# Patient Record
Sex: Female | Born: 1995 | Race: White | Hispanic: No | Marital: Married | Smoking: Former smoker
Health system: Southern US, Community
[De-identification: ages and names within clinical notes are randomized; demographics above are authoritative.]

## PROBLEM LIST (undated history)

## (undated) DIAGNOSIS — K219 Gastro-esophageal reflux disease without esophagitis: Secondary | ICD-10-CM

## (undated) DIAGNOSIS — F419 Anxiety disorder, unspecified: Secondary | ICD-10-CM

## (undated) DIAGNOSIS — G8929 Other chronic pain: Secondary | ICD-10-CM

## (undated) DIAGNOSIS — F32A Depression, unspecified: Secondary | ICD-10-CM

## (undated) DIAGNOSIS — G935 Compression of brain: Secondary | ICD-10-CM

## (undated) DIAGNOSIS — R519 Other chronic pain: Secondary | ICD-10-CM

## (undated) HISTORY — DX: Other chronic pain: G89.29

## (undated) HISTORY — DX: Compression of brain: G93.5

## (undated) HISTORY — DX: Headache, unspecified: R51.9

---

## 2021-09-28 NOTE — Progress Notes (Signed)
Referring:  Curlene Dolphin, MD [address] [place],  [place] [address]  PCP: Nino Glow, PA-C  Neurology was asked to evaluate Sabrina Diaz, a 25 year old female for a chief complaint of headaches.  Our recommendations of care will be communicated by shared medical record.    CC:  headaches  HPI:  Medical co-morbidities: anxiety, depression  The patient presents for evaluation of headaches which began suddenly one month ago. She woke up that day and went to use the bathroom. She was straining to use the bathroom and then developed a severe, sudden headache. Headache is described as occipital throbbing and sharp pains which radiate down the neck. It is associated with photophobia, phonophobia, and nausea. Right ear feels full as well. Pain is constant but can be exacerbated by coughing or using the bathroom. She has lost ~20 lbs since the onset of the headache due to lack of appetite and nausea.   Started Topamax earlier this month which has helped her headache, but she reports new numbness in her hands and feet.  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 She has an appointment with neurosurgery scheduled for November.  Had frequent headaches as a child. Underwent testing at that time but no cause for headaches were found.  Headache History: Onset: 1 month ago Triggers: cold or heat, coughing, using the bathroom Aura: no Location: occiput radiating down into neck Quality/Description: pounding, crushing Severity: 5-10/10 Associated Symptoms:  Photophobia: yes  Phonophobia: yes  Nausea: yes Vomiting: yes Double vision Worse with activity?: yes Duration of headaches: constant Red flags:   Positional component  Thunderclap onset  Headache days per month: 30 Headache free days per month: 0  Current Treatment: Abortive Excedrin, ibuprofen - taking  every day  Preventative Topamax 50 mg daily  Prior Therapies                                 Topamax 50 mg daily   Headache Risk Factors: Headache risk factors and/or co-morbidities (+) Neck Pain (-) Back Pain (-) History of Motor Vehicle Accident (+) Obesity  Body mass index is 36.65 kg/m. (+) History of Traumatic Brain Injury and/or Concussion  LABS: 09/19/21: CBC, BMP wnl  IMAGING:  CTA head/neck 08/26/21: negative for aneurysms, dissection, or stenosis  MRI brain 09/02/21: right cerebellar tonsil extending 8 mm in foramen magnum with mild crowding   Current Outpatient Medications on File Prior to Visit  Medication Sig Dispense Refill   Ondansetron HCl (ZOFRAN PO) Take by mouth.     topiramate (TOPAMAX) 50 MG tablet Take 50 mg by mouth 2 (two) times daily.     No current facility-administered medications on file prior to visit.     Allergies: No Known Allergies  Family History: Migraine or other headaches in the family:  mother and grandmother Aneurysms in a first degree relative:  no Brain tumors in the family:  no Other neurological illness in the family:   cousin has chiari and spina bifida  Past Medical History: Past Medical History:  Diagnosis Date   Chronic headaches     Past Surgical History History reviewed. No pertinent surgical history.  Social History: Social History   Tobacco Use   Smoking status: Never   Smokeless tobacco: Never  Substance Use Topics   Alcohol use:  Never   Drug use: Yes    Types: Marijuana    ROS: Negative for fevers, chills. Positive for headaches. All other systems reviewed and negative unless stated otherwise in HPI.   Physical Exam:   Vital Signs: BP 108/78   Pulse 69   Ht 5\' 7"  (1.702 m)   Wt 234 lb (106.1 kg)   SpO2 98%   BMI 36.65 kg/m  GENERAL: well appearing,in no acute distress,alert SKIN:  Color, texture, turgor normal. No rashes or lesions HEAD:  Normocephalic/atraumatic. CV:  RRR RESP:  Normal respiratory effort MSK: no tenderness to palpation over occiput, neck, or shoulders  NEUROLOGICAL: Mental Status: Alert, oriented to person, place and time,Follows commands Cranial Nerves: PERRL, no papilledema visualized, visual fields intact to confrontation,extraocular movements intact,facial sensation intact,no facial droop or ptosis,hearing intact to finger rub bilaterally,no dysarthria,palate elevate symmetrically,tongue protrudes midline,shoulder shrug intact and symmetric Motor: muscle strength 5/5 both upper and lower extremities,no drift, normal tone Reflexes: 2+ throughout Sensation: intact to light touch all 4 extremities Coordination: Finger-to- nose-finger intact bilaterally,Heel-to-shin intact bilaterally Gait: normal-based   IMPRESSION: 25 year old female with a history of anxiety and depression who presents for evaluation of new onset daily headache in the setting of recently discovered Chiari malformation. She has aspects of the headache which are consistent with Chiari headaches including occipital location and worsening with valsalva. She is planned to see neurosurgery in November to assess if she is a surgical candidate. In the meantime will order MRI brain with CINE to assess for CSF flow obstruction. Will also order MRI of the whole spine to assess for syrinx. In terms of headache management, Topamax is helping but causing bothersome numbness and paresthesias. Will switch to Zonisamide to see if this is better tolerated.  PLAN: -Stop Topamax. Start Zonisamide 100 mg daily for two weeks, then increase to 200 mg daily. -MRI Brain CINE -MRI C,T, L spine -NSGY appointment scheduled for November  I spent a total of 79 minutes chart reviewing and counseling the patient, her husband, and her mother. Headache education was done. Discussed treatment options including preventive and acute medications. Discussed medication side effects, adverse reactions and drug  interactions. Written educational materials and patient instructions outlining all of the above were given.  Follow-up: 3 months   December, MD 09/29/2021   8:38 AM

## 2021-09-29 ENCOUNTER — Encounter: Payer: Self-pay | Admitting: Psychiatry

## 2021-09-29 ENCOUNTER — Ambulatory Visit: Payer: 59 | Admitting: Psychiatry

## 2021-09-29 VITALS — BP 108/78 | HR 69 | Ht 67.0 in | Wt 234.0 lb

## 2021-09-29 DIAGNOSIS — G935 Compression of brain: Secondary | ICD-10-CM

## 2021-09-29 MED ORDER — ZONISAMIDE 100 MG PO CAPS: ORAL_CAPSULE | ORAL | 2 refills | Status: DC

## 2021-09-29 MED ORDER — LORAZEPAM 0.5 MG PO TABS: ORAL_TABLET | ORAL | 0 refills | Status: DC

## 2021-09-29 NOTE — Patient Instructions (Addendum)
MRI of spinal cord to look for syrinx. MRI brain with sequence to look at flow of spinal fluid (CINE sequence) Start Zonisamide 100 mg daily for two weeks, then increase to 200 mg daily.

## 2021-10-03 ENCOUNTER — Telehealth: Payer: Self-pay | Admitting: Psychiatry

## 2021-10-03 MED ORDER — LORAZEPAM 0.5 MG PO TABS: ORAL_TABLET | ORAL | 0 refills | Status: DC

## 2021-10-03 NOTE — Telephone Encounter (Signed)
I sent in a second order of Ativan for her, thanks

## 2021-10-03 NOTE — Telephone Encounter (Signed)
Brain: [phone] Cervical: [phone] Lumbar: [phone] Thoracic: [phone]  Exp. 10/03/21 to 11/17/21)  Patient cannot have all four MRI's on the same day. The brain and cervical are scheduled for 10/12/21 at [facility] and the Lumbar and Thoracic is scheduled for 10/19/21.  Patient wants to make sure she has enough medicine to help her with her being claustrophobic for both days.

## 2021-10-12 ENCOUNTER — Ambulatory Visit (INDEPENDENT_AMBULATORY_CARE_PROVIDER_SITE_OTHER): Payer: 59

## 2021-10-12 DIAGNOSIS — G935 Compression of brain: Secondary | ICD-10-CM

## 2021-10-12 MED ORDER — GADOBENATE DIMEGLUMINE 529 MG/ML IV SOLN
20.0000 mL | Freq: Once | INTRAVENOUS | Status: AC | PRN
  Administered 2021-10-12: 20 mL via INTRAVENOUS

## 2021-10-19 ENCOUNTER — Ambulatory Visit (INDEPENDENT_AMBULATORY_CARE_PROVIDER_SITE_OTHER): Payer: 59

## 2021-10-19 DIAGNOSIS — G935 Compression of brain: Secondary | ICD-10-CM | POA: Diagnosis not present

## 2021-11-08 ENCOUNTER — Encounter: Payer: Self-pay | Admitting: Psychiatry

## 2021-11-08 NOTE — Telephone Encounter (Signed)
error 

## 2021-11-09 ENCOUNTER — Telehealth: Payer: Self-pay | Admitting: Psychiatry

## 2021-11-09 NOTE — Telephone Encounter (Signed)
Robin at [facility] with Dr. Samson Frederic requesting patients MRI images for patient in office. Mailed MRI disc copies to [facility] [place], [place] ATTN: Neurosurgery. Replaced disc copies in medical records office

## 2021-12-12 ENCOUNTER — Ambulatory Visit: Payer: 59 | Admitting: Psychiatry

## 2021-12-12 ENCOUNTER — Telehealth: Payer: Self-pay | Admitting: Psychiatry

## 2021-12-12 NOTE — Telephone Encounter (Signed)
FYI- pt called to cancel appt, very sick did not want to spread any germs.

## 2022-01-06 NOTE — Progress Notes (Signed)
° °  CC:  headaches  Follow-up Visit  Last visit: 09/29/22  Brief HPI: 26 year old female with a history of Chiari malformation, anxiety, and depression who follows in clinic for headaches.  At her last visit, she was having paresthesias with Topamax and it was switched to zonisamide. MRI brain and C/T/L spine was ordered.  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 There was no evidence of syrinx on C/T/L spine.  She saw Neurosurgery in December who planned to repeat MRI in one year. She would like a second opinion.  Her headaches have improved with Zonisamide. They are no longer constant, but can still be triggered by coughing and straining. She has stopped taking ibuprofen and Excedrin. Her paresthesias have stopped since stopping Topamax.  She has been struggling with insomnia lately. Is only sleeping a few hours per night.  Current Headache Regimen: Preventative: zonisamide 200 mg daily  Prior Therapies                                  Topamax 50 mg daily Zonisamide 200 mg daily  Physical Exam:   Vital Signs: BP 124/85    Pulse 67    Ht 5\' 7"  (1.702 m)    Wt 228 lb (103.4 kg)    BMI 35.71 kg/m  GENERAL:  well appearing, in no acute distress, alert  SKIN:  Color, texture, turgor normal. No rashes or lesions HEAD:  Normocephalic/atraumatic. RESP: normal respiratory effort MSK:  No gross joint deformities.   NEUROLOGICAL: Mental Status: Alert, oriented to person, place and time, Follows commands, and Speech fluent and appropriate. Cranial Nerves: PERRL, face symmetric, no dysarthria, hearing grossly intact Motor: moves all extremities equally Gait: normal-based.  IMPRESSION: 26 year old female with a history of Chiari malformation, anxiety, and depression who presents for follow up of headaches. She has had some improvement on Zonisamide but continues to suffer from headaches and  insomnia. Will add nortriptyline and see if this provides further relief. Referral to Neurosurgery for a second opinion placed per patient's request.  PLAN: -Preventive: Start nortriptyline 10 mg QHS x1 week then increase to 20 mg QHS. Continue Zonisamide 200 mg daily -Referral to Neurosurgery for second opinion regarding Chiari malformation   Follow-up: 3 months  I spent a total of 27 minutes on the date of the service. Discussed treatment options including preventive medications. Discussed medication side effects, adverse reactions and drug interactions. Written educational materials and patient instructions outlining all of the above were given.  22, MD 01/09/22 10:22 AM

## 2022-01-09 ENCOUNTER — Ambulatory Visit: Payer: 59 | Admitting: Psychiatry

## 2022-01-09 ENCOUNTER — Encounter: Payer: Self-pay | Admitting: Psychiatry

## 2022-01-09 VITALS — BP 124/85 | HR 67 | Ht 67.0 in | Wt 228.0 lb

## 2022-01-09 DIAGNOSIS — G43009 Migraine without aura, not intractable, without status migrainosus: Secondary | ICD-10-CM | POA: Diagnosis not present

## 2022-01-09 DIAGNOSIS — G935 Compression of brain: Secondary | ICD-10-CM

## 2022-01-09 MED ORDER — ZONISAMIDE 100 MG PO CAPS: 200.0000 mg | ORAL_CAPSULE | Freq: Every day | ORAL | 3 refills | Status: DC

## 2022-01-09 MED ORDER — NORTRIPTYLINE HCL 10 MG PO CAPS: ORAL_CAPSULE | ORAL | 3 refills | Status: DC

## 2022-01-09 NOTE — Patient Instructions (Signed)
Start nortriptyline for headache prevention and sleep. Take one pill at bedtime at one week, then increase to 2 pills at bedtime Continue Zonisamide for now Referral to Neurosurgery - Dr. Maurice Small

## 2022-01-10 ENCOUNTER — Telehealth: Payer: Self-pay | Admitting: Psychiatry

## 2022-01-10 NOTE — Telephone Encounter (Signed)
Sent to [facility] ph # [phone]. 

## 2022-03-03 ENCOUNTER — Encounter: Payer: Self-pay | Admitting: Psychiatry

## 2022-03-06 ENCOUNTER — Other Ambulatory Visit: Payer: Self-pay | Admitting: Psychiatry

## 2022-03-06 MED ORDER — NORTRIPTYLINE HCL 50 MG PO CAPS: 50.0000 mg | ORAL_CAPSULE | Freq: Every day | ORAL | 3 refills | Status: DC

## 2022-03-08 ENCOUNTER — Other Ambulatory Visit: Payer: Self-pay | Admitting: Neurological Surgery

## 2022-03-24 NOTE — Pre-Procedure Instructions (Signed)
Surgical Instructions ? ? ? Your procedure is scheduled on Monday, May 1st. ? Report to [facility] Main Entrance "A" at 5:30 A.M., then check in with the Admitting office. ? Call this number if you have problems the morning of surgery: ? [phone] ? ? If you have any questions prior to your surgery date call [phone]: Open Monday-Friday 8am-4pm ? ? ? Remember: ? Do not eat after midnight the night before your surgery ? ?You may drink clear liquids until 4:30 a.m. the morning of your surgery.   ?Clear liquids allowed are: Water, Non-Citrus Juices (without pulp), Carbonated Beverages, Clear Tea, Black Coffee Only (NO MILK, CREAM OR POWDERED CREAMER of any kind), and Gatorade. ?  ? Take these medicines the morning of surgery with A SIP OF WATER  ?zonisamide (ZONEGRAN) ? ?As of today, STOP taking any Aspirin (unless otherwise instructed by your surgeon) Aleve, Naproxen, Ibuprofen, Motrin, Advil, Goody's, BC's, all herbal medications, fish oil, and all vitamins. ?         ?           ?Do NOT Smoke (Tobacco/Vaping) for 24 hours prior to your procedure. ? ?If you use a CPAP at night, you may bring your mask/headgear for your overnight stay. ?  ?Contacts, glasses, piercing's, hearing aid's, dentures or partials may not be worn into surgery, please bring cases for these belongings.  ?  ?For patients admitted to the hospital, discharge time will be determined by your treatment team. ?  ?Patients discharged the day of surgery will not be allowed to drive home, and someone needs to stay with them for 24 hours. ? ?SURGICAL WAITING ROOM VISITATION ?Patients having surgery or a procedure may have two support people in the waiting room. These visitors may be switched out with other visitors if needed. ?Children under the age of 84 must have an adult accompany them who is not the patient. ?If the patient needs to stay at the hospital during part of their recovery, the visitor guidelines for inpatient rooms apply. ? ?Please  refer to the [facility] website for the visitor guidelines for Inpatients (after your surgery is over and you are in a regular room).  ? ? ?Special instructions:   ?[facility]- Preparing For Surgery ? ?Before surgery, you can play an important role. Because skin is not sterile, your skin needs to be as free of germs as possible. You can reduce the number of germs on your skin by washing with CHG (chlorahexidine gluconate) Soap before surgery.  CHG is an antiseptic cleaner which kills germs and bonds with the skin to continue killing germs even after washing.   ? ?Oral Hygiene is also important to reduce your risk of infection.  Remember - BRUSH YOUR TEETH THE MORNING OF SURGERY WITH YOUR REGULAR TOOTHPASTE ? ?Please do not use if you have an allergy to CHG or antibacterial soaps. If your skin becomes reddened/irritated stop using the CHG.  ?Do not shave (including legs and underarms) for at least 48 hours prior to first CHG shower. It is OK to shave your face. ? ?Please follow these instructions carefully. ?  ?Shower the NIGHT BEFORE SURGERY and the MORNING OF SURGERY ? ?If you chose to wash your hair, wash your hair first as usual with your normal shampoo. ? ?After you shampoo, rinse your hair and body thoroughly to remove the shampoo. ? ?Use CHG Soap as you would any other liquid soap. You can apply CHG directly to the skin and wash  gently with a scrungie or a clean washcloth.  ? ?Apply the CHG Soap to your body ONLY FROM THE NECK DOWN.  Do not use on open wounds or open sores. Avoid contact with your eyes, ears, mouth and genitals (private parts). Wash Face and genitals (private parts)  with your normal soap.  ? ?Wash thoroughly, paying special attention to the area where your surgery will be performed. ? ?Thoroughly rinse your body with warm water from the neck down. ? ?DO NOT shower/wash with your normal soap after using and rinsing off the CHG Soap. ? ?Pat yourself dry with a CLEAN TOWEL. ? ?Wear CLEAN  PAJAMAS to bed the night before surgery ? ?Place CLEAN SHEETS on your bed the night before your surgery ? ?DO NOT SLEEP WITH PETS. ? ? ?Day of Surgery: ?Take a shower with CHG soap. ?Do not wear jewelry or makeup ?Do not wear lotions, powders, perfumes, or deodorant. ?Do not shave 48 hours prior to surgery.   ?Do not bring valuables to the hospital.  ?[facility] is not responsible for any belongings or valuables. ?Do not wear nail polish, gel polish, artificial nails, or any other type of covering on natural nails (fingers and toes) ?If you have artificial nails or gel coating that need to be removed by a nail salon, please have this removed prior to surgery. Artificial nails or gel coating may interfere with anesthesia's ability to adequately monitor your vital signs. ?Wear Clean/Comfortable clothing the morning of surgery ?Remember to brush your teeth WITH YOUR REGULAR TOOTHPASTE. ?  ?Please read over the following fact sheets that you were given. ? ? ? ?If you received a COVID test during your pre-op visit  it is requested that you wear a mask when out in public, stay away from anyone that may not be feeling well and notify your surgeon if you develop symptoms. If you have been in contact with anyone that has tested positive in the last 10 days please notify you surgeon. ? ?

## 2022-03-27 ENCOUNTER — Encounter (HOSPITAL_COMMUNITY)
Admission: RE | Admit: 2022-03-27 | Discharge: 2022-03-27 | Disposition: A | Discharge status: Home / Self Care | Payer: 59 | Source: Ambulatory Visit | Attending: Neurological Surgery | Admitting: Neurological Surgery

## 2022-03-27 ENCOUNTER — Other Ambulatory Visit: Payer: Self-pay

## 2022-03-27 ENCOUNTER — Encounter (HOSPITAL_COMMUNITY): Payer: Self-pay

## 2022-03-27 VITALS — BP 123/84 | HR 88 | Temp 97.9°F | Resp 18 | Ht 66.0 in | Wt 214.3 lb

## 2022-03-27 DIAGNOSIS — Z01812 Encounter for preprocedural laboratory examination: Secondary | ICD-10-CM | POA: Diagnosis not present

## 2022-03-27 DIAGNOSIS — Z01818 Encounter for other preprocedural examination: Secondary | ICD-10-CM

## 2022-03-27 LAB — TYPE AND SCREEN
ABO/RH(D): O POS
Antibody Screen: NEGATIVE

## 2022-03-27 LAB — BASIC METABOLIC PANEL
Anion gap: 5 (ref 5–15)
BUN: 6 mg/dL (ref 6–20)
CO2: 23 mmol/L (ref 22–32)
Calcium: 8.7 mg/dL — ABNORMAL LOW (ref 8.9–10.3)
Chloride: 112 mmol/L — ABNORMAL HIGH (ref 98–111)
Creatinine, Ser: 0.92 mg/dL (ref 0.44–1.00)
GFR, Estimated: >60 mL/min (ref >60)
Glucose, Bld: 104 mg/dL — ABNORMAL HIGH (ref 70–99)
Potassium: 3.5 mmol/L (ref 3.5–5.1)
Sodium: 140 mmol/L (ref 135–145)

## 2022-03-27 LAB — CBC
HCT: 40.9 % (ref 36.0–46.0)
Hemoglobin: 13.9 g/dL (ref 12.0–15.0)
MCH: 29.4 pg (ref 26.0–34.0)
MCHC: 34 g/dL (ref 30.0–36.0)
MCV: 86.7 fL (ref 80.0–100.0)
Platelets: 251 10*3/uL (ref 150–400)
RBC: 4.72 MIL/uL (ref 3.87–5.11)
RDW: 13.1 % (ref 11.5–15.5)
WBC: 8.3 10*3/uL (ref 4.0–10.5)
nRBC: 0 % (ref 0.0–0.2)

## 2022-03-27 NOTE — Progress Notes (Signed)
PCP - Dr. Gaynelle Arabian ?Neuro-Dr. Anderson Malta Chima ?Cardiologist - denies ? ?PPM/ICD - n/a ? ?Chest x-ray - n/a ?EKG - n/a ?Stress Test - denies ?ECHO - denies ?Cardiac Cath - denies ? ?Sleep Study - denies ?CPAP - denies ? ?Blood Thinner Instructions: n/a ?Aspirin Instructions: n/a ? ?ERAS Protcol -Clear liquids until 0430 DOS ?PRE-SURGERY Ensure or G2- none ordered ? ?COVID TEST- n/a ? ?Anesthesia review: No ? ?Patient denies shortness of breath, fever, cough and chest pain at PAT appointment ? ? ?All instructions explained to the patient, with a verbal understanding of the material. Patient agrees to go over the instructions while at home for a better understanding. Patient also instructed to self quarantine after being tested for COVID-19. The opportunity to ask questions was provided. ? ? ?

## 2022-04-03 ENCOUNTER — Other Ambulatory Visit: Payer: Self-pay

## 2022-04-03 ENCOUNTER — Encounter (HOSPITAL_COMMUNITY)
Admission: RE | Disposition: A | Discharge status: Home / Self Care | Payer: Self-pay | Source: Home / Self Care | Attending: Neurological Surgery

## 2022-04-03 ENCOUNTER — Inpatient Hospital Stay (HOSPITAL_COMMUNITY): Payer: 59 | Admitting: Certified Registered Nurse Anesthetist

## 2022-04-03 ENCOUNTER — Inpatient Hospital Stay (HOSPITAL_COMMUNITY)
Admission: RE | Admit: 2022-04-03 | Discharge: 2022-04-06 | DRG: 027 | Disposition: A | Discharge status: Home / Self Care | Payer: 59 | Attending: Neurological Surgery | Admitting: Neurological Surgery

## 2022-04-03 ENCOUNTER — Encounter (HOSPITAL_COMMUNITY): Payer: Self-pay | Admitting: Neurological Surgery

## 2022-04-03 DIAGNOSIS — G935 Compression of brain: Secondary | ICD-10-CM

## 2022-04-03 DIAGNOSIS — R519 Headache, unspecified: Secondary | ICD-10-CM | POA: Diagnosis present

## 2022-04-03 DIAGNOSIS — Z87891 Personal history of nicotine dependence: Secondary | ICD-10-CM

## 2022-04-03 HISTORY — PX: SUBOCCIPITAL CRANIECTOMY CERVICAL LAMINECTOMY: SHX5404

## 2022-04-03 LAB — CREATININE, SERUM
Creatinine, Ser: 0.96 mg/dL (ref 0.44–1.00)
GFR, Estimated: >60 mL/min (ref >60)

## 2022-04-03 LAB — CBC
HCT: 38.7 % (ref 36.0–46.0)
Hemoglobin: 13.4 g/dL (ref 12.0–15.0)
MCH: 30.2 pg (ref 26.0–34.0)
MCHC: 34.6 g/dL (ref 30.0–36.0)
MCV: 87.4 fL (ref 80.0–100.0)
Platelets: 228 10*3/uL (ref 150–400)
RBC: 4.43 MIL/uL (ref 3.87–5.11)
RDW: 13.1 % (ref 11.5–15.5)
WBC: 15.5 10*3/uL — ABNORMAL HIGH (ref 4.0–10.5)
nRBC: 0 % (ref 0.0–0.2)

## 2022-04-03 LAB — ABO/RH: ABO/RH(D): O POS

## 2022-04-03 LAB — GLUCOSE, CAPILLARY: Glucose-Capillary: 142 mg/dL — ABNORMAL HIGH (ref 70–99)

## 2022-04-03 LAB — MRSA NEXT GEN BY PCR, NASAL: MRSA by PCR Next Gen: NOT DETECTED

## 2022-04-03 LAB — POCT PREGNANCY, URINE: Preg Test, Ur: NEGATIVE

## 2022-04-03 SURGERY — SUBOCCIPITAL CRANIECTOMY CERVICAL LAMINECTOMY/DURAPLASTY
Anesthesia: General

## 2022-04-03 MED ORDER — CHLORHEXIDINE GLUCONATE CLOTH 2 % EX PADS: 6.0000 | MEDICATED_PAD | Freq: Once | CUTANEOUS | Status: DC

## 2022-04-03 MED ORDER — PROPOFOL 10 MG/ML IV BOLUS
INTRAVENOUS | Status: DC | PRN
  Administered 2022-04-03: 200 mg via INTRAVENOUS
  Administered 2022-04-03: 50 mg via INTRAVENOUS

## 2022-04-03 MED ORDER — OXYCODONE HCL 5 MG/5ML PO SOLN: 5.0000 mg | Freq: Once | ORAL | Status: DC | PRN

## 2022-04-03 MED ORDER — FENTANYL CITRATE (PF) 100 MCG/2ML IJ SOLN
INTRAMUSCULAR | Status: AC
  Filled 2022-04-03: qty 2

## 2022-04-03 MED ORDER — OXYCODONE HCL 5 MG PO TABS
10.0000 mg | ORAL_TABLET | ORAL | Status: DC | PRN
  Administered 2022-04-03 – 2022-04-06 (×14): 10 mg via ORAL
  Filled 2022-04-03 (×14): qty 2

## 2022-04-03 MED ORDER — OXYCODONE HCL 5 MG PO TABS: 5.0000 mg | ORAL_TABLET | Freq: Once | ORAL | Status: DC | PRN

## 2022-04-03 MED ORDER — AMISULPRIDE (ANTIEMETIC) 5 MG/2ML IV SOLN
INTRAVENOUS | Status: AC
  Filled 2022-04-03: qty 4

## 2022-04-03 MED ORDER — ROCURONIUM BROMIDE 10 MG/ML (PF) SYRINGE
PREFILLED_SYRINGE | INTRAVENOUS | Status: DC | PRN
  Administered 2022-04-03: 20 mg via INTRAVENOUS
  Administered 2022-04-03: 60 mg via INTRAVENOUS

## 2022-04-03 MED ORDER — LIDOCAINE-EPINEPHRINE 1 %-1:100000 IJ SOLN
INTRAMUSCULAR | Status: DC | PRN
  Administered 2022-04-03: 10 mL

## 2022-04-03 MED ORDER — FENTANYL CITRATE (PF) 250 MCG/5ML IJ SOLN
INTRAMUSCULAR | Status: AC
  Filled 2022-04-03: qty 5

## 2022-04-03 MED ORDER — FENTANYL CITRATE (PF) 100 MCG/2ML IJ SOLN
INTRAMUSCULAR | Status: DC | PRN
  Administered 2022-04-03 (×5): 50 ug via INTRAVENOUS

## 2022-04-03 MED ORDER — CEFAZOLIN SODIUM-DEXTROSE 2-4 GM/100ML-% IV SOLN
2.0000 g | Freq: Three times a day (TID) | INTRAVENOUS | Status: AC
  Administered 2022-04-03 (×2): 2 g via INTRAVENOUS
  Filled 2022-04-03 (×2): qty 100

## 2022-04-03 MED ORDER — LIDOCAINE 2% (20 MG/ML) 5 ML SYRINGE
INTRAMUSCULAR | Status: DC | PRN
  Administered 2022-04-03: 100 mg via INTRAVENOUS

## 2022-04-03 MED ORDER — FENTANYL CITRATE (PF) 100 MCG/2ML IJ SOLN
25.0000 ug | INTRAMUSCULAR | Status: DC | PRN
  Administered 2022-04-03: 50 ug via INTRAVENOUS
  Administered 2022-04-03 (×2): 25 ug via INTRAVENOUS
  Administered 2022-04-03 (×2): 50 ug via INTRAVENOUS

## 2022-04-03 MED ORDER — DOCUSATE SODIUM 100 MG PO CAPS
100.0000 mg | ORAL_CAPSULE | Freq: Two times a day (BID) | ORAL | Status: DC
  Administered 2022-04-03 – 2022-04-06 (×6): 100 mg via ORAL
  Filled 2022-04-03 (×6): qty 1

## 2022-04-03 MED ORDER — ONDANSETRON HCL 4 MG/2ML IJ SOLN
INTRAMUSCULAR | Status: DC | PRN
  Administered 2022-04-03: 4 mg via INTRAVENOUS

## 2022-04-03 MED ORDER — HEMOSTATIC AGENTS (NO CHARGE) OPTIME
TOPICAL | Status: DC | PRN
  Administered 2022-04-03: 1 via TOPICAL

## 2022-04-03 MED ORDER — CEFAZOLIN SODIUM-DEXTROSE 2-4 GM/100ML-% IV SOLN
INTRAVENOUS | Status: AC
Start: 2022-04-03 — End: 2022-04-03
  Filled 2022-04-03: qty 100

## 2022-04-03 MED ORDER — THROMBIN 5000 UNITS EX SOLR
OROMUCOSAL | Status: DC | PRN
  Administered 2022-04-03: 5 mL via TOPICAL

## 2022-04-03 MED ORDER — BACITRACIN ZINC 500 UNIT/GM EX OINT
TOPICAL_OINTMENT | CUTANEOUS | Status: DC | PRN
  Administered 2022-04-03: 1 via TOPICAL

## 2022-04-03 MED ORDER — PHENYLEPHRINE 80 MCG/ML (10ML) SYRINGE FOR IV PUSH (FOR BLOOD PRESSURE SUPPORT)
PREFILLED_SYRINGE | INTRAVENOUS | Status: DC | PRN
  Administered 2022-04-03: 80 ug via INTRAVENOUS

## 2022-04-03 MED ORDER — LABETALOL HCL 5 MG/ML IV SOLN: 10.0000 mg | INTRAVENOUS | Status: DC | PRN

## 2022-04-03 MED ORDER — ACETAMINOPHEN 500 MG PO TABS: 1000.0000 mg | ORAL_TABLET | Freq: Once | ORAL | Status: DC | PRN

## 2022-04-03 MED ORDER — ZONISAMIDE 100 MG PO CAPS
100.0000 mg | ORAL_CAPSULE | Freq: Two times a day (BID) | ORAL | Status: DC
  Administered 2022-04-03 – 2022-04-06 (×6): 100 mg via ORAL
  Filled 2022-04-03 (×7): qty 1

## 2022-04-03 MED ORDER — CHLORHEXIDINE GLUCONATE CLOTH 2 % EX PADS
6.0000 | MEDICATED_PAD | Freq: Every day | CUTANEOUS | Status: DC
  Administered 2022-04-03 – 2022-04-05 (×3): 6 via TOPICAL

## 2022-04-03 MED ORDER — ACETAMINOPHEN 650 MG RE SUPP: 650.0000 mg | RECTAL | Status: DC | PRN

## 2022-04-03 MED ORDER — AMISULPRIDE (ANTIEMETIC) 5 MG/2ML IV SOLN
10.0000 mg | Freq: Once | INTRAVENOUS | Status: AC
  Administered 2022-04-03: 10 mg via INTRAVENOUS

## 2022-04-03 MED ORDER — OXYCODONE HCL 5 MG PO TABS: 5.0000 mg | ORAL_TABLET | ORAL | Status: DC | PRN

## 2022-04-03 MED ORDER — SODIUM CHLORIDE 0.9 % IV SOLN: INTRAVENOUS | Status: DC

## 2022-04-03 MED ORDER — MIDAZOLAM HCL 2 MG/2ML IJ SOLN
INTRAMUSCULAR | Status: DC | PRN
  Administered 2022-04-03: 2 mg via INTRAVENOUS

## 2022-04-03 MED ORDER — CEFAZOLIN SODIUM-DEXTROSE 2-4 GM/100ML-% IV SOLN
2.0000 g | INTRAVENOUS | Status: AC
  Administered 2022-04-03: 2 g via INTRAVENOUS

## 2022-04-03 MED ORDER — ONDANSETRON HCL 4 MG/2ML IJ SOLN
INTRAMUSCULAR | Status: AC
  Filled 2022-04-03: qty 2

## 2022-04-03 MED ORDER — POLYETHYLENE GLYCOL 3350 17 G PO PACK
17.0000 g | PACK | Freq: Every day | ORAL | Status: DC | PRN
  Administered 2022-04-04: 17 g via ORAL
  Filled 2022-04-03: qty 1

## 2022-04-03 MED ORDER — NORTRIPTYLINE HCL 25 MG PO CAPS
50.0000 mg | ORAL_CAPSULE | Freq: Every day | ORAL | Status: DC
  Administered 2022-04-03 – 2022-04-05 (×3): 50 mg via ORAL
  Filled 2022-04-03 (×4): qty 2

## 2022-04-03 MED ORDER — ORAL CARE MOUTH RINSE: 15.0000 mL | Freq: Once | OROMUCOSAL | Status: AC

## 2022-04-03 MED ORDER — DEXAMETHASONE SODIUM PHOSPHATE 10 MG/ML IJ SOLN
INTRAMUSCULAR | Status: DC | PRN
  Administered 2022-04-03: 10 mg via INTRAVENOUS

## 2022-04-03 MED ORDER — ACETAMINOPHEN 160 MG/5ML PO SOLN: 1000.0000 mg | Freq: Once | ORAL | Status: DC | PRN

## 2022-04-03 MED ORDER — LIDOCAINE 2% (20 MG/ML) 5 ML SYRINGE
INTRAMUSCULAR | Status: AC
  Filled 2022-04-03: qty 5

## 2022-04-03 MED ORDER — DEXAMETHASONE SODIUM PHOSPHATE 10 MG/ML IJ SOLN
INTRAMUSCULAR | Status: AC
  Filled 2022-04-03: qty 1

## 2022-04-03 MED ORDER — HYDROMORPHONE HCL 1 MG/ML IJ SOLN
0.5000 mg | INTRAMUSCULAR | Status: DC | PRN
  Administered 2022-04-04 – 2022-04-05 (×5): 0.5 mg via INTRAVENOUS
  Filled 2022-04-03 (×2): qty 1
  Filled 2022-04-03: qty 0.5
  Filled 2022-04-03 (×2): qty 1
  Filled 2022-04-03: qty 0.5

## 2022-04-03 MED ORDER — PROMETHAZINE HCL 25 MG PO TABS
12.5000 mg | ORAL_TABLET | ORAL | Status: DC | PRN
  Administered 2022-04-04 (×2): 12.5 mg via ORAL
  Administered 2022-04-04: 25 mg via ORAL
  Filled 2022-04-03 (×3): qty 1

## 2022-04-03 MED ORDER — MIDAZOLAM HCL 2 MG/2ML IJ SOLN
INTRAMUSCULAR | Status: AC
  Filled 2022-04-03: qty 2

## 2022-04-03 MED ORDER — HEPARIN SODIUM (PORCINE) 5000 UNIT/ML IJ SOLN
5000.0000 [IU] | Freq: Three times a day (TID) | INTRAMUSCULAR | Status: DC
  Administered 2022-04-05 – 2022-04-06 (×4): 5000 [IU] via SUBCUTANEOUS
  Filled 2022-04-03 (×4): qty 1

## 2022-04-03 MED ORDER — THROMBIN 5000 UNITS EX SOLR
CUTANEOUS | Status: AC
  Filled 2022-04-03: qty 5000

## 2022-04-03 MED ORDER — CHLORHEXIDINE GLUCONATE 0.12 % MT SOLN
OROMUCOSAL | Status: AC
Start: 2022-04-03 — End: 2022-04-03
  Administered 2022-04-03: 15 mL via OROMUCOSAL
  Filled 2022-04-03: qty 15

## 2022-04-03 MED ORDER — CHLORHEXIDINE GLUCONATE 0.12 % MT SOLN: 15.0000 mL | Freq: Once | OROMUCOSAL | Status: AC

## 2022-04-03 MED ORDER — LIDOCAINE-EPINEPHRINE 1 %-1:100000 IJ SOLN
INTRAMUSCULAR | Status: AC
  Filled 2022-04-03: qty 1

## 2022-04-03 MED ORDER — ROCURONIUM BROMIDE 10 MG/ML (PF) SYRINGE
PREFILLED_SYRINGE | INTRAVENOUS | Status: AC
  Filled 2022-04-03: qty 10

## 2022-04-03 MED ORDER — ACETAMINOPHEN 10 MG/ML IV SOLN
1000.0000 mg | Freq: Once | INTRAVENOUS | Status: DC | PRN
  Administered 2022-04-03: 1000 mg via INTRAVENOUS

## 2022-04-03 MED ORDER — KETOROLAC TROMETHAMINE 30 MG/ML IJ SOLN
INTRAMUSCULAR | Status: DC | PRN
  Administered 2022-04-03: 30 mg via INTRAVENOUS

## 2022-04-03 MED ORDER — ACETAMINOPHEN 10 MG/ML IV SOLN
INTRAVENOUS | Status: AC
  Filled 2022-04-03: qty 100

## 2022-04-03 MED ORDER — BACITRACIN ZINC 500 UNIT/GM EX OINT
TOPICAL_OINTMENT | CUTANEOUS | Status: AC
  Filled 2022-04-03: qty 28.35

## 2022-04-03 MED ORDER — SUGAMMADEX SODIUM 200 MG/2ML IV SOLN
INTRAVENOUS | Status: DC | PRN
  Administered 2022-04-03: 200 mg via INTRAVENOUS

## 2022-04-03 MED ORDER — ONDANSETRON HCL 4 MG/2ML IJ SOLN
4.0000 mg | INTRAMUSCULAR | Status: DC | PRN
  Administered 2022-04-04 – 2022-04-06 (×10): 4 mg via INTRAVENOUS
  Filled 2022-04-03 (×10): qty 2

## 2022-04-03 MED ORDER — LACTATED RINGERS IV SOLN
INTRAVENOUS | Status: DC
Start: 2022-04-03 — End: 2022-04-03

## 2022-04-03 MED ORDER — ONDANSETRON HCL 4 MG PO TABS
4.0000 mg | ORAL_TABLET | ORAL | Status: DC | PRN
  Administered 2022-04-03: 4 mg via ORAL
  Filled 2022-04-03 (×2): qty 1

## 2022-04-03 MED ORDER — 0.9 % SODIUM CHLORIDE (POUR BTL) OPTIME
TOPICAL | Status: DC | PRN
  Administered 2022-04-03: 2000 mL

## 2022-04-03 MED ORDER — ACETAMINOPHEN 325 MG PO TABS
650.0000 mg | ORAL_TABLET | ORAL | Status: DC | PRN
  Administered 2022-04-03 – 2022-04-06 (×14): 650 mg via ORAL
  Filled 2022-04-03 (×13): qty 2

## 2022-04-03 SURGICAL SUPPLY — 58 items
BAG COUNTER SPONGE SURGICOUNT (BAG) ×3 IMPLANT
BAND RUBBER #18 3X1/16 STRL (MISCELLANEOUS) IMPLANT
BENZOIN TINCTURE PRP APPL 2/3 (GAUZE/BANDAGES/DRESSINGS) IMPLANT
BLADE CLIPPER SURG (BLADE) ×4 IMPLANT
BLADE ULTRA TIP 2M (BLADE) IMPLANT
BUR ACORN 9.0 PRECISION (BURR) ×2 IMPLANT
BUR PRECISION FLUTE 5.0 (BURR) IMPLANT
CANISTER SUCT 3000ML PPV (MISCELLANEOUS) ×2 IMPLANT
CLIP VESOCCLUDE MED 6/CT (CLIP) IMPLANT
COVER BACK TABLE 60X90IN (DRAPES) IMPLANT
DERMABOND ADHESIVE PROPEN (GAUZE/BANDAGES/DRESSINGS) ×1
DERMABOND ADVANCED (GAUZE/BANDAGES/DRESSINGS) ×1
DERMABOND ADVANCED .7 DNX12 (GAUZE/BANDAGES/DRESSINGS) ×1 IMPLANT
DERMABOND ADVANCED .7 DNX6 (GAUZE/BANDAGES/DRESSINGS) IMPLANT
DRAPE LAPAROTOMY 100X72 PEDS (DRAPES) ×2 IMPLANT
DRAPE MICROSCOPE LEICA (MISCELLANEOUS) IMPLANT
DRAPE WARM FLUID 44X44 (DRAPES) ×2 IMPLANT
DURAPREP 6ML APPLICATOR 50/CS (WOUND CARE) ×2 IMPLANT
ELECT REM PT RETURN 9FT ADLT (ELECTROSURGICAL) ×2
ELECTRODE REM PT RTRN 9FT ADLT (ELECTROSURGICAL) ×1 IMPLANT
EVACUATOR 1/8 PVC DRAIN (DRAIN) IMPLANT
EVACUATOR SILICONE 100CC (DRAIN) IMPLANT
GAUZE 4X4 16PLY ~~LOC~~+RFID DBL (SPONGE) ×1 IMPLANT
GAUZE SPONGE 4X4 12PLY STRL (GAUZE/BANDAGES/DRESSINGS) IMPLANT
GLOVE BIOGEL PI IND STRL 7.5 (GLOVE) ×1 IMPLANT
GLOVE BIOGEL PI INDICATOR 7.5 (GLOVE) ×1
GLOVE ECLIPSE 7.5 STRL STRAW (GLOVE) ×2 IMPLANT
GOWN STRL REUS W/ TWL LRG LVL3 (GOWN DISPOSABLE) ×1 IMPLANT
GOWN STRL REUS W/ TWL XL LVL3 (GOWN DISPOSABLE) IMPLANT
GOWN STRL REUS W/TWL 2XL LVL3 (GOWN DISPOSABLE) IMPLANT
GOWN STRL REUS W/TWL LRG LVL3 (GOWN DISPOSABLE) ×1
GOWN STRL REUS W/TWL XL LVL3 (GOWN DISPOSABLE)
HEMOSTAT POWDER KIT SURGIFOAM (HEMOSTASIS) ×2 IMPLANT
HEMOSTAT SURGICEL 2X14 (HEMOSTASIS) IMPLANT
KIT BASIN OR (CUSTOM PROCEDURE TRAY) ×2 IMPLANT
KIT TURNOVER KIT B (KITS) ×2 IMPLANT
NDL HYPO 25X1 1.5 SAFETY (NEEDLE) ×1 IMPLANT
NEEDLE HYPO 25X1 1.5 SAFETY (NEEDLE) ×2 IMPLANT
NS IRRIG 1000ML POUR BTL (IV SOLUTION) ×3 IMPLANT
PACK CRANIOTOMY CUSTOM (CUSTOM PROCEDURE TRAY) ×2 IMPLANT
PAD ARMBOARD 7.5X6 YLW CONV (MISCELLANEOUS) ×6 IMPLANT
PATTIES SURGICAL .5 X1 (DISPOSABLE) IMPLANT
PATTIES SURGICAL 1/4 X 3 (GAUZE/BANDAGES/DRESSINGS) IMPLANT
SEALANT ADHERUS EXTEND TIP (MISCELLANEOUS) ×1 IMPLANT
SPONGE SURGIFOAM ABS GEL 100 (HEMOSTASIS) ×1 IMPLANT
SPONGE T-LAP 4X18 ~~LOC~~+RFID (SPONGE) ×1 IMPLANT
STAPLER SKIN PROX WIDE 3.9 (STAPLE) ×1 IMPLANT
SUT ETHILON 3 0 FSL (SUTURE) IMPLANT
SUT MNCRL AB 3-0 PS2 27 (SUTURE) ×2 IMPLANT
SUT PROLENE 6 0 BV (SUTURE) ×4 IMPLANT
SUT VIC AB 0 CT1 18XCR BRD8 (SUTURE) ×1 IMPLANT
SUT VIC AB 0 CT1 8-18 (SUTURE) ×1
SUT VIC AB 2-0 CP2 18 (SUTURE) ×2 IMPLANT
TOWEL GREEN STERILE (TOWEL DISPOSABLE) ×2 IMPLANT
TOWEL GREEN STERILE FF (TOWEL DISPOSABLE) IMPLANT
TRAY FOLEY MTR SLVR 16FR STAT (SET/KITS/TRAYS/PACK) IMPLANT
UNDERPAD 30X36 HEAVY ABSORB (UNDERPADS AND DIAPERS) IMPLANT
WATER STERILE IRR 1000ML POUR (IV SOLUTION) ×2 IMPLANT

## 2022-04-03 NOTE — Transfer of Care (Signed)
Immediate Anesthesia Transfer of Care Note ? ?Patient: Sabrina Diaz ? ?Procedure(s) Performed: Chiari decompression ? ?Patient Location: PACU ? ?Anesthesia Type:General ? ?Level of Consciousness: drowsy and patient cooperative ? ?Airway & Oxygen Therapy: Patient Spontanous Breathing and Patient connected to face mask oxygen ? ?Post-op Assessment: Report given to RN and Post -op Vital signs reviewed and stable ? ?Post vital signs: Reviewed and stable ? ?Last Vitals:  ?Vitals Value Taken Time  ?BP 115/70 04/03/22 1005  ?Temp    ?Pulse 99 04/03/22 1006  ?Resp 20 04/03/22 1006  ?SpO2 100 % 04/03/22 1006  ?Vitals shown include unvalidated device data. ? ?Last Pain:  ?Vitals:  ? 04/03/22 0620  ?TempSrc:   ?PainSc: 0-No pain  ?   ? ?  ? ?Complications: No notable events documented. ?

## 2022-04-03 NOTE — H&P (Signed)
Surgical H&P Update ? ?HPI: 26 y.o. with a history of headaches. Workup showed a chiari malformation. No changes in health since they were last seen. Still having symptoms and wishes to proceed with surgery. ? ?PMHx:  ?Past Medical History:  ?Diagnosis Date  ? Chiari malformation type I (HCC)   ? Chronic headaches   ? ?FamHx: History reviewed. No pertinent family history. ?SocHx:  reports that she quit smoking about 8 months ago. Her smoking use included cigarettes. She has never used smokeless tobacco. She reports current alcohol use. She reports that she does not currently use drugs after having used the following drugs: Marijuana. ? ?Physical Exam: ?Aox3, PERRL, EOMI, FS & SS, Strength 5/5 x4 and SILTx4  ? ?Assesment/Plan: ?26 y.o. woman with chiari malformation, here for chiari decompression. Risks, benefits, and alternatives discussed and the patient would like to continue with surgery. ? ?-OR today ?-4N post-op ? ?Jadene Pierini, MD ?04/03/22 ?7:33 AM ? ?

## 2022-04-03 NOTE — Anesthesia Procedure Notes (Signed)
Procedure Name: Intubation ?Date/Time: 04/03/2022 8:00 AM ?Performed by: Genelle Bal, CRNA ?Pre-anesthesia Checklist: Patient identified, Emergency Drugs available, Suction available and Patient being monitored ?Patient Re-evaluated:Patient Re-evaluated prior to induction ?Oxygen Delivery Method: Circle system utilized ?Preoxygenation: Pre-oxygenation with 100% oxygen ?Induction Type: IV induction ?Ventilation: Mask ventilation without difficulty ?Laryngoscope Size: Sabra Heck and 2 ?Grade View: Grade I ?Tube type: Oral ?Tube size: 7.0 mm ?Number of attempts: 1 ?Airway Equipment and Method: Stylet and Oral airway ?Placement Confirmation: ETT inserted through vocal cords under direct vision, positive ETCO2 and breath sounds checked- equal and bilateral ?Secured at: 21 cm ?Tube secured with: Tape ?Dental Injury: Teeth and Oropharynx as per pre-operative assessment  ? ? ? ? ?

## 2022-04-03 NOTE — Op Note (Signed)
PATIENT: Sabrina Diaz [name] ? ?DAY OF SURGERY: 04/03/22 ?  ?PRE-OPERATIVE DIAGNOSIS:  Chiari malformation ?  ?POST-OPERATIVE DIAGNOSIS:  Same ?  ?PROCEDURE:  Suboccipital decompression for chiari malformaion ?  ?SURGEON:  Surgeon(s) and Role: ?   Jadene Pierini, MD - Primary ?  ?ANESTHESIA: ETGA ?  ?BRIEF HISTORY: This is a 26 year old woman who presented with occipital headaches. The patient was found to have a chiari malformation. This was discussed with the patient as well as risks, benefits, and alternatives and wished to proceed with surgery. ?  ?OPERATIVE DETAIL: The patient was taken to the operating room, anesthesia was induced by the anesthesia team, the [place] head holder was applied, and the patient was placed on the OR table in the prone position. A formal time out was performed with two patient identifiers and confirmed the operative site. The operative site was marked, hair was clipped with surgical clippers, the area was then prepped and draped in a sterile fashion.  ? ?A linear incision was placed in the midline in between the inion and C2. Soft tissues were dissected in a subperiosteal fashion, bony anatomy was identified. A suboccipital craniectomy was performed with a combination of high speed drill and kerrison rongeurs. The posterior extradural band was adherent to the dura and couldn't be resected easily by itself. I therefor opened the dura but was able to do so without violating the arachnoid. Given the intact arachnoid, I opened the dura widely and saw good decompression. I extended this down to the C1 arch through the thickened chiari-associated dural band with good decompression. Of note, the C1 arch was impressively mobile in flexion and extension compared to normal anatomy. Given the lack of CSF leak, I placed Adheris across the defect but did not sew in a graft. ? ?Prior to placing the adheris, I copiously irrigated the wound, all instrument and sponge counts were correct, and the  incision was then closed in layers. The patient was then returned to anesthesia for emergence. No apparent complications at the completion of the procedure. ?  ?EBL:  17mL ?  ?DRAINS: none ?  ?SPECIMENS: none ?  ?Jadene Pierini, MD ?04/03/22 ?7:47 AM ? ?

## 2022-04-03 NOTE — Anesthesia Preprocedure Evaluation (Signed)
Anesthesia Evaluation  ?Patient identified by MRN, date of birth, ID band ?Patient awake ? ? ? ?Reviewed: ?Allergy & Precautions, NPO status , Patient's Chart, lab work & pertinent test results ? ?History of Anesthesia Complications ?Negative for: history of anesthetic complications ? ?Airway ?Mallampati: III ? ?TM Distance: >3 FB ?Neck ROM: Full ? ? ? Dental ? ?(+) Teeth Intact, Dental Advisory Given,  ?  ?Pulmonary ?neg shortness of breath, neg sleep apnea, neg COPD, neg recent URI, former smoker,  ?  ?breath sounds clear to auscultation ? ? ? ? ? ? Cardiovascular ?negative cardio ROS ? ? ?Rhythm:Regular  ? ?  ?Neuro/Psych ? Headaches, Chiari malformation type 1 ?  ? GI/Hepatic ?negative GI ROS, Neg liver ROS,   ?Endo/Other  ?negative endocrine ROS ? Renal/GU ?negative Renal ROS  ? ?  ?Musculoskeletal ?negative musculoskeletal ROS ?(+)  ? Abdominal ?  ?Peds ? Hematology ?negative hematology ROS ?(+)   ?Anesthesia Other Findings ? ? Reproductive/Obstetrics ?Lab Results ?     Component                Value               Date                 ?     PREGTESTUR               NEGATIVE            04/03/2022           ? ? ?  ? ? ? ? ? ? ? ? ? ? ? ? ? ?  ?  ? ? ? ? ? ? ? ? ?Anesthesia Physical ?Anesthesia Plan ? ?ASA: 2 ? ?Anesthesia Plan: General  ? ?Post-op Pain Management: Ofirmev IV (intra-op)* and Toradol IV (intra-op)*  ? ?Induction: Intravenous ? ?PONV Risk Score and Plan: 3 and Ondansetron and Dexamethasone ? ?Airway Management Planned: Oral ETT ? ?Additional Equipment:  ? ?Intra-op Plan:  ? ?Post-operative Plan: Extubation in OR ? ?Informed Consent: I have reviewed the patients History and Physical, chart, labs and discussed the procedure including the risks, benefits and alternatives for the proposed anesthesia with the patient or authorized representative who has indicated his/her understanding and acceptance.  ? ? ? ?Dental advisory given ? ?Plan Discussed with:  CRNA ? ?Anesthesia Plan Comments:   ? ? ? ? ? ? ?Anesthesia Quick Evaluation ? ?

## 2022-04-03 NOTE — Progress Notes (Signed)
?  Transition of Care (TOC) Screening Note ? ? ?Patient Details  ?Name: Sabrina Diaz ?Date of Birth: 03/19/1996 ? ? ?Transition of Care [facility]) CM/SW Contact:    ?Baldemar Lenis, LCSW ?Phone Number: ?04/03/2022, 3:02 PM ? ? ? ?Transition of Care Department [facility]) has reviewed patient and no TOC needs have been identified at this time; medical workup ongoing. We will continue to monitor patient advancement through interdisciplinary progression rounds. If new patient transition needs arise, please place a TOC consult. ?  ?

## 2022-04-04 ENCOUNTER — Encounter (HOSPITAL_COMMUNITY): Payer: Self-pay | Admitting: Neurological Surgery

## 2022-04-04 NOTE — Evaluation (Signed)
Physical Therapy Evaluation ?Patient Details ?Name: Sabrina Diaz ?MRN: [id number] ?DOB: 09-Jul-1996 ?Today's Date: 04/04/2022 ? ?History of Present Illness ? 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 She is now s/p  Suboccipital decompression for chiari malformaion 5/1. PMHx: chronic headaches  ?Clinical Impression ? Patient presents with mobility close to baseline.  Some difficulty due to cervical stiffness and incisional pain.  She reports spouse and mother will be available to assist as needed at d/c.  No further skilled PT needs noted.  Will sign off.    ?   ? ?Recommendations for follow up therapy are one component of a multi-disciplinary discharge planning process, led by the attending physician.  Recommendations may be updated based on patient status, additional functional criteria and insurance authorization. ? ?Follow Up Recommendations No PT follow up ? ?  ?Assistance Recommended at Discharge Intermittent Supervision/Assistance  ?Patient can return home with the following ? Assistance with cooking/housework;Assist for transportation;Help with stairs or ramp for entrance ? ?  ?Equipment Recommendations None recommended by PT  ?Recommendations for Other Services ?    ?  ?Functional Status Assessment Patient has had a recent decline in their functional status and demonstrates the ability to make significant improvements in function in a reasonable and predictable amount of time.  ? ?  ?Precautions / Restrictions Precautions ?Precautions: Fall ?Precaution Comments: mild risk due to cervical stiffness only ?Restrictions ?Weight Bearing Restrictions: No  ? ?  ? ?Mobility ? Bed Mobility ?Overal bed mobility: Modified Independent ?  ?  ?  ?  ?  ?  ?  ?  ? ?Transfers ?Overall transfer level: Modified independent ?  ?  ?  ?  ?  ?  ?  ?  ?General transfer comment: no assist needed except for lines ?  ? ?Ambulation/Gait ?Ambulation/Gait assistance: Supervision ?Gait  Distance (Feet): 400 Feet ?Assistive device: None ?Gait Pattern/deviations: Step-through pattern, Decreased stride length ?  ?  ?  ?General Gait Details: slower pace likely due to lines and cervical stiffness, no LOB ? ?Stairs ?Stairs: Yes ?Stairs assistance: Supervision, Min guard ?Stair Management: One rail Left, Alternating pattern, Forwards ?Number of Stairs: 3 ?General stair comments: assist for lines and safety, demonstrates no LOB or issued despite having to look down and still with cervical stiffness ? ?Wheelchair Mobility ?  ? ?Modified Rankin (Stroke Patients Only) ?  ? ?  ? ?Balance Overall balance assessment: No apparent balance deficits (not formally assessed) ?  ?  ?  ?  ?  ?  ?  ?  ?  ?  ?  ?  ?  ?  ?  ?  ?  ?  ?   ? ? ? ?Pertinent Vitals/Pain Pain Assessment ?Pain Assessment: 0-10 ?Pain Score: 7  ?Pain Location: neck with movement ?Pain Descriptors / Indicators: Tightness ?Pain Intervention(s): Monitored during session, Repositioned  ? ? ?Home Living Family/patient expects to be discharged to:: Private residence ?Living Arrangements: Spouse/significant other;Parent ?Available Help at Discharge: Family ?Type of Home: House ?Home Access: Stairs to enter ?Entrance Stairs-Rails: Right;Left ?Entrance Stairs-Number of Steps: 3 ?  ?Home Layout: One level ?Home Equipment: None ?   ?  ?Prior Function Prior Level of Function : Independent/Modified Independent ?  ?  ?  ?  ?  ?  ?Mobility Comments: indep ?ADLs Comments: out of work since diagnosed, but was completing all IADL's except driving ?  ? ? ?Hand Dominance  ? Dominant Hand: Right ? ?  ?  Extremity/Trunk Assessment  ? Upper Extremity Assessment ?Upper Extremity Assessment: Defer to OT evaluation ?  ? ?Lower Extremity Assessment ?Lower Extremity Assessment: Overall WFL for tasks assessed ?  ? ?Cervical / Trunk Assessment ?Cervical / Trunk Assessment: Neck Surgery  ?Communication  ? Communication: No difficulties  ?Cognition Arousal/Alertness:  Awake/alert ?Behavior During Therapy: [facility] for tasks assessed/performed ?Overall Cognitive Status: Within Functional Limits for tasks assessed ?  ?  ?  ?  ?  ?  ?  ?  ?  ?  ?  ?  ?  ?  ?  ?  ?  ?  ?  ? ?  ?General Comments General comments (skin integrity, edema, etc.): VSS on RA ? ?  ?Exercises Other Exercises ?Other Exercises: encouraged gentle painfree AROM of neck especially after walking and increased circulation  ? ?Assessment/Plan  ?  ?PT Assessment Patient does not need any further PT services  ?PT Problem List   ? ?   ?  ?PT Treatment Interventions     ? 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 ?  ?Frequency   ?  ? ? ?Co-evaluation PT/OT/SLP Co-Evaluation/Treatment: Yes ?Reason for Co-Treatment: For patient/therapist safety;To address functional/ADL transfers ?PT goals addressed during session: Mobility/safety with mobility;Balance ?OT goals addressed during session: ADL's and self-care ?  ? ? ?  ?AM-PAC PT "6 Clicks" Mobility  ?Outcome Measure Help needed turning from your back to your side while in a flat bed without using bedrails?: A Little ?Help needed moving from lying on your back to sitting on the side of a flat bed without using bedrails?: None ?Help needed moving to and from a bed to a chair (including a wheelchair)?: None ?Help needed standing up from a chair using your arms (e.g., wheelchair or bedside chair)?: None ?Help needed to walk in hospital room?: A Little ?Help needed climbing 3-5 steps with a railing? : A Little ?6 Click Score: 21 ? ?  ?End of Session Equipment Utilized During Treatment: Gait belt ?Activity Tolerance: Patient tolerated treatment well ?Patient left: in chair;with call bell/phone within reach ?  ?PT Visit Diagnosis: Difficulty in walking, not elsewhere classified (R26.2) ?  ? ?Time:  1191-4782 ?PT Time Calculation (min) (ACUTE ONLY): 18 min ? ? ?Charges:   PT Evaluation ?$PT Eval Low Complexity: 1 Low ?  ?  ?   ? ? ?Sheran Lawless, PT ?Acute Rehabilitation Services ?[phone] ?Office:[phone] ?04/04/2022 ? ? ?Elray Mcgregor ?04/04/2022, 9:50 AM ? ?

## 2022-04-04 NOTE — Progress Notes (Signed)
Neurosurgery Service ?Progress Note ? ?Subjective: No acute events overnight, neck pain a little better than usual, headaches well controlled, nausea yesterday but resolved today  ? ?Objective: ?Vitals:  ? 04/04/22 0500 04/04/22 0600 04/04/22 0700 04/04/22 0800  ?BP: 101/70 108/71 117/66 117/69  ?Pulse: 81 70 95 (!) 102  ?Resp: 14 12 18 17   ?Temp:    99.2 ?F (37.3 ?C)  ?TempSrc:    Oral  ?SpO2: 97% 96% 98% 98%  ?Weight:      ?Height:      ? ? ?Physical Exam: ?Aox3, PERRL, EOMI, FS & SS, TM, strength 5/5x4, SILTx4, no dysmetria, incision c/d/i ? ?Assessment & Plan: ?26 y.o. woman s/p suboccipital craniectomy for chiari decompression, recovering well. ? ?-transfer to floor ?-possible discharge tomorrow if she continues to do well ? ?Alisa Stjames A Damin Salido  ?04/04/22 ?8:40 AM ? ?

## 2022-04-04 NOTE — Discharge Summary (Incomplete)
Discharge Summary ? ?Date of Admission: 04/03/2022 ? ?Date of Discharge: *** ? ?Attending Physician: Autumn Patty, MD ? ?Hospital Course: Patient was admitted following an uncomplicated suboccipital craniectomy for chiari decompression. They were recovered in PACU and transferred to 4N. Their preop symptoms were completely resolved, their hospital course was uncomplicated and the patient was discharged home on ***. They will follow up in clinic with me in clinic in 2 weeks. ? ?Neurologic exam at discharge:  ?Aox3, PERRL, EOMI, FS & SS, TM, strength 5/5x4, SILTx4, incision c/d/i ? ?Discharge diagnosis: Chiari malformation, type 1 ? ?Jadene Pierini, MD ?04/04/22 ?8:41 AM ? ?

## 2022-04-04 NOTE — Evaluation (Signed)
Occupational Therapy Evaluation ?Patient Details ?Name: Sabrina Diaz ?MRN: [id number] ?DOB: 06-Dec-1995 ?Today's Date: 04/04/2022 ? ? 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 She is now s/p  Suboccipital decompression for chiari malformaion 5/1. PMHx: chronic headaches  ? ?Clinical Impression ?  ?Sabrina Diaz was evaluated s/p the above surgery. She is indep at baseline and lives with her husband, she has not driven or worked since October due to her new dx. Upon evaluation pt demonstrated supervision A mobility and ADLs without AD. Pt endorsed some increased neck pain with reaching over head and head turns, but demonstrated good use of compensatory techniques for pain management. Pt does not have further acute OT needs. Recommend d/c home without follow up OT.  ?   ? ?Recommendations for follow up therapy are one component of a multi-disciplinary discharge planning process, led by the attending physician.  Recommendations may be updated based on patient status, additional functional criteria and insurance authorization.  ? ?Follow Up Recommendations ? No OT follow up  ?  ?Assistance Recommended at Discharge Intermittent Supervision/Assistance  ?Patient can return home with the following A little help with bathing/dressing/bathroom;Assist for transportation ? ?  ?Functional Status Assessment ? Patient has had a recent decline in their functional status and demonstrates the ability to make significant improvements in function in a reasonable and predictable amount of time.  ?Equipment Recommendations ? None recommended by OT  ?  ?Recommendations for Other Services   ? ? ?  ?Precautions / Restrictions Precautions ?Precautions: Fall ?Restrictions ?Weight Bearing Restrictions: No  ? ?  ? ?Mobility Bed Mobility ?Overal bed mobility: Modified Independent ?  ?  ?  ?  ?  ?  ?General bed mobility comments: HOB slightly elevated, increased time for pain  mangement, no phyiscal assist ?  ? ?Transfers ?Overall transfer level: Needs assistance ?Equipment used: None ?Transfers: Sit to/from Stand ?Sit to Stand: Supervision ?  ?  ?  ?  ?  ?  ?  ? ?  ?Balance Overall balance assessment: No apparent balance deficits (not formally assessed) ?  ?  ?  ?  ?  ?  ?  ?  ?  ?  ?  ?  ?  ?  ?  ?  ?  ?  ?   ? ?ADL either performed or assessed with clinical judgement  ? ?ADL Overall ADL's : Needs assistance/impaired ?  ?  ?  ?  ?  ?  ?  ?  ?  ?  ?  ?  ?  ?  ?  ?  ?  ?  ?  ?General ADL Comments: overall supervision A for all ADLs for safety only. Pt reports some increased neck pain with overhead reaching and head turns. She demonstrated good compensatory techniques for LB tasks.  ? ? ? ?Vision Baseline Vision/History: 1 Wears glasses ?Ability to See in Adequate Light: 0 Adequate ?Patient Visual Report: No change from baseline ?Vision Assessment?: No apparent visual deficits  ?   ?   ?   ? ?Pertinent Vitals/Pain Pain Assessment ?Pain Assessment: 0-10 ?Pain Score: 7  ?Pain Location: neck with movement ?Pain Descriptors / Indicators: Tightness ?Pain Intervention(s): Monitored during session  ? ? ? ?Hand Dominance Right ?  ?Extremity/Trunk Assessment Upper Extremity Assessment ?Upper Extremity Assessment: Overall WFL for tasks assessed ?  ?Lower Extremity Assessment ?Lower Extremity Assessment: Defer to PT evaluation ?  ?Cervical / Trunk Assessment ?  Cervical / Trunk Assessment: Neck Surgery ?  ?Communication Communication ?Communication: No difficulties ?  ?Cognition Arousal/Alertness: Awake/alert ?Behavior During Therapy: [facility] for tasks assessed/performed ?Overall Cognitive Status: Within Functional Limits for tasks assessed ?  ?  ?  ?  ?  ?  ?General Comments  VSS on RA - negotiated 3 steps without difficulty ? ?  ? ?Home Living Family/patient expects to be discharged to:: Private residence ?Living Arrangements: Spouse/significant other;Parent ?Available Help at Discharge: Family ?Type of  Home: House ?Home Access: Stairs to enter ?Entrance Stairs-Number of Steps: 3 ?Entrance Stairs-Rails: Right;Left ?Home Layout: One level ?  ?  ?Bathroom Shower/Tub: Tub/shower unit ?  ?Bathroom Toilet: Standard ?  ?  ?Home Equipment: None ?  ?  ?  ? ?  ?Prior Functioning/Environment Prior Level of Function : Independent/Modified Independent ?  ?  ?  ?  ?  ?  ?Mobility Comments: indep ?ADLs Comments: out of work since diagnosed, but was completing all IADL's except driving ?  ? ?  ?  ?OT Problem List: Decreased range of motion;Decreased activity tolerance;Pain ?  ?   ?   ?OT Goals(Current goals can be found in the care plan section) Acute Rehab OT Goals ?Patient Stated Goal: home soon ?OT Goal Formulation: All assessment and education complete, DC therapy ?Time For Goal Achievement: 04/04/22  ?OT Frequency:   ?  ? ?Co-evaluation PT/OT/SLP Co-Evaluation/Treatment: Yes ?Reason for Co-Treatment: For patient/therapist safety;To address functional/ADL transfers ?  ?OT goals addressed during session: ADL's and self-care ?  ? ?  ?AM-PAC OT "6 Clicks" Daily Activity     ?Outcome Measure Help from another person eating meals?: None ?Help from another person taking care of personal grooming?: None ?Help from another person toileting, which includes using toliet, bedpan, or urinal?: None ?Help from another person bathing (including washing, rinsing, drying)?: None ?Help from another person to put on and taking off regular upper body clothing?: None ?Help from another person to put on and taking off regular lower body clothing?: None ?6 Click Score: 24 ?  ?End of Session Nurse Communication: Mobility status ? ?Activity Tolerance: Patient tolerated treatment well ?Patient left: in chair;with call bell/phone within reach;with chair alarm set ? ?OT Visit Diagnosis: Other abnormalities of gait and mobility (R26.89);Pain  ?              ?Time: 8527-7824 ?OT Time Calculation (min): 18 min ?Charges:  OT General Charges ?$OT Visit: 1  Visit ?OT Evaluation ?$OT Eval Low Complexity: 1 Low ? ? ?Kanijah Groseclose A Jin Capote ?04/04/2022, 9:42 AM ?

## 2022-04-04 NOTE — Anesthesia Postprocedure Evaluation (Signed)
Anesthesia Post Note ? ?Patient: Sabrina Diaz ? ?Procedure(s) Performed: Chiari decompression ? ?  ? ?Patient location during evaluation: PACU ?Anesthesia Type: General ?Level of consciousness: awake and alert ?Pain management: pain level controlled ?Vital Signs Assessment: post-procedure vital signs reviewed and stable ?Respiratory status: spontaneous breathing, nonlabored ventilation, respiratory function stable and patient connected to nasal cannula oxygen ?Cardiovascular status: blood pressure returned to baseline and stable ?Postop Assessment: no apparent nausea or vomiting ?Anesthetic complications: no ? ? ?No notable events documented. ? ?Last Vitals:  ?Vitals:  ? 04/04/22 0700 04/04/22 0800  ?BP: 117/66   ?Pulse: 95   ?Resp: 18   ?Temp:  37.3 ?C  ?SpO2: 98%   ?  ?Last Pain:  ?Vitals:  ? 04/04/22 0800  ?TempSrc: Oral  ?PainSc:   ? ? ?  ?  ?  ?  ?  ?  ? ?Aarron Wierzbicki ? ? ? ? ?

## 2022-04-05 ENCOUNTER — Inpatient Hospital Stay (HOSPITAL_COMMUNITY): Payer: 59

## 2022-04-05 MED ORDER — OXYCODONE-ACETAMINOPHEN 5-325 MG PO TABS: 1.0000 | ORAL_TABLET | ORAL | 0 refills | Status: DC | PRN

## 2022-04-05 NOTE — Progress Notes (Signed)
Spoke with Selena Batten from Neuro surgery. She will reach out to Dr Maurice Small regarding pt request for CT head results and inquiry to DC home.  ? ?Updated pt and family at bedside of plan. ?

## 2022-04-05 NOTE — Progress Notes (Signed)
Neurosurgery Service ?Progress Note ? ?Subjective: No acute events overnight, some intermittent horizontal diplopia overnight, 1 episode of emesis but this morning on rounds no diplopia, ambulating some, no nausea, neck pain improving ? ?Objective: ?Vitals:  ? 04/04/22 1718 04/04/22 2031 04/05/22 0034 04/05/22 0522  ?BP: 112/78 129/83 112/78 (!) 145/81  ?Pulse: 95 79 95 (!) 101  ?Resp: 18 18 18 18   ?Temp: 98.4 ?F (36.9 ?C) 98.4 ?F (36.9 ?C) 98.6 ?F (37 ?C) 98.3 ?F (36.8 ?C)  ?TempSrc: Oral Oral Oral Oral  ?SpO2: 100% 98% 96% 96%  ?Weight:      ?Height:      ? ? ?Physical Exam: ?Aox3, PERRL, EOMI, FS & SS, TM, strength 5/5x4, SILTx4, no dysmetria, incision c/d/i ? ?Assessment & Plan: ?26 y.o. woman s/p suboccipital craniectomy for chiari decompression, recovering well. ? ?-will get CTH today to make sure she's not developing hydro with an early 6th palsy, but unlikely given how well she's doing clinically ?-possible discharge today if she does well ? ?Sabrina Diaz A Nasreen Goedecke  ?04/05/22 ?9:12 AM ? ?

## 2022-04-05 NOTE — Discharge Summary (Signed)
Physician Discharge Summary  ?Patient ID: ?Gerald Leitz Gilbo ?MRN: [id number] ?DOB/AGE: June 13, 1996 26 y.o. ? ?Admit date: 04/03/2022 ?Discharge date: 04/05/2022 ? ?Admission Diagnoses: Chiari malformation  ? ? ?Discharge Diagnoses: same ? ? ?Discharged Condition: good ? ?Hospital Course: The patient was admitted on 04/03/2022 and taken to the operating room where the patient underwent suboccipital crani for chiari malformation. The patient tolerated the procedure well and was taken to the recovery room and then to the icu in stable condition. The hospital course was routine. There were no complications. The wound remained clean dry and intact. Pt had appropriate head soreness. No complaints of new pain or new N/T/W. The patient remained afebrile with stable vital signs, and tolerated a regular diet. The patient continued to increase activities, and pain was well controlled with oral pain medications.  ? ?Consults: None ? ?Significant Diagnostic Studies:  ?Results for orders placed or performed during the hospital encounter of 04/03/22  ?MRSA Next Gen by PCR, Nasal  ? Specimen: Nasal Mucosa; Nasal Swab  ?Result Value Ref Range  ? MRSA by PCR Next Gen NOT DETECTED NOT DETECTED  ?CBC  ?Result Value Ref Range  ? WBC 15.5 (H) 4.0 - 10.5 K/uL  ? RBC 4.43 3.87 - 5.11 MIL/uL  ? Hemoglobin 13.4 12.0 - 15.0 g/dL  ? HCT 38.7 36.0 - 46.0 %  ? MCV 87.4 80.0 - 100.0 fL  ? MCH 30.2 26.0 - 34.0 pg  ? MCHC 34.6 30.0 - 36.0 g/dL  ? RDW 13.1 11.5 - 15.5 %  ? Platelets 228 150 - 400 K/uL  ? nRBC 0.0 0.0 - 0.2 %  ?Creatinine, serum  ?Result Value Ref Range  ? Creatinine, Ser 0.96 0.44 - 1.00 mg/dL  ? GFR, Estimated >60 >60 mL/min  ?Glucose, capillary  ?Result Value Ref Range  ? Glucose-Capillary 142 (H) 70 - 99 mg/dL  ?Pregnancy, urine POC  ?Result Value Ref Range  ? Preg Test, Ur NEGATIVE NEGATIVE  ?ABO/Rh  ?Result Value Ref Range  ? ABO/RH(D)    ?  O POS ?Performed at [facility], [address] [place], [place] [address] ?  ? ? ?CT HEAD  WO CONTRAST ( ) ? ?Result Date: 04/05/2022 ?CLINICAL DATA:  Double vision after Chiari malformation. EXAM: CT HEAD WITHOUT CONTRAST TECHNIQUE: Contiguous axial images were obtained from the base of the skull through the vertex without intravenous contrast. RADIATION DOSE REDUCTION: This exam was performed according to the departmental dose-optimization program which includes automated exposure control, adjustment of the mA and/or kV according to patient size and/or use of iterative reconstruction technique. COMPARISON:  10/12/2021 FINDINGS: Brain: Cerebellar tonsillar ectopia which changes of recent suboccipital decompression with hemostatic material at the bone defect. No complicating hemorrhage or visible infarct. Mild brain sagging suspected when compared to prior MRI. Vascular: Negative Skull: Suboccipital decompression. Sinuses/Orbits: Negative IMPRESSION: 1. No complicating infarct or hemorrhage after suboccipital decompression. 2. Suspect mild brain sagging when compared to preoperative MRI Electronically Signed   By: Tiburcio Pea M.D.   On: 04/05/2022 09:20   ? ?Antibiotics:  ?Anti-infectives (From admission, onward)  ? ? Start     Dose/Rate Route Frequency Ordered Stop  ? 04/03/22 1230  ceFAZolin (ANCEF) IVPB 2g/100 mL premix       ? 2 g ?200 mL/hr over 30 Minutes Intravenous Every 8 hours 04/03/22 1142 04/03/22 2144  ? 04/03/22 0615  ceFAZolin (ANCEF) IVPB 2g/100 mL premix       ? 2 g ?200 mL/hr over  30 Minutes Intravenous On call to O.R. 04/03/22 5643 04/03/22 3295  ? 04/03/22 0615  ceFAZolin (ANCEF) 2-4 GM/100ML-% IVPB       ?Note to Pharmacy: Lurena Nida: cabinet override  ?    04/03/22 0615 04/03/22 0804  ? ?  ? ? ?Discharge Exam: ?Blood pressure (!) 145/81, pulse (!) 101, temperature 98.3 ?F (36.8 ?C), temperature source Oral, resp. rate 18, height 5\' 6"  (1.676 m), weight 96.6 kg, last menstrual period 03/16/2022, SpO2 96 %. ?Neurologic: Grossly normal ?Ambulating and voiding well incision cdi   ? ?Discharge Medications:   ?Allergies as of 04/05/2022   ?No Known Allergies ?  ? ?  ?Medication List  ?  ? ?TAKE these medications   ? ?nortriptyline 50 MG capsule ?Commonly known as: PAMELOR ?Take 1 capsule (50 mg total) by mouth at bedtime. ?  ?oxyCODONE-acetaminophen 5-325 MG tablet ?Commonly known as: PERCOCET/ROXICET ?Take 1 tablet by mouth every 4 (four) hours as needed for severe pain. ?  ?zonisamide 100 MG capsule ?Commonly known as: ZONEGRAN ?Take 2 capsules (200 mg total) by mouth daily. Take 100 mg (1 pill) daily for two weeks, then increase to 200 mg (2 pills) daily. ?What changed:  ?how much to take ?when to take this ?additional instructions ?  ? ?  ? ? ?Disposition: home  ? ?Final Dx: suboccipital crani for chiari malformation ? ?Discharge Instructions   ? ? Call MD for:  difficulty breathing, headache or visual disturbances   Complete by: As directed ?  ? Call MD for:  hives   Complete by: As directed ?  ? Call MD for:  persistant nausea and vomiting   Complete by: As directed ?  ? Call MD for:  redness, tenderness, or signs of infection (pain, swelling, redness, odor or green/yellow discharge around incision site)   Complete by: As directed ?  ? Call MD for:  severe uncontrolled pain   Complete by: As directed ?  ? Call MD for:  temperature >100.4   Complete by: As directed ?  ? Diet - low sodium heart healthy   Complete by: As directed ?  ? Increase activity slowly   Complete by: As directed ?  ? No wound care   Complete by: As directed ?  ? ?  ? ? ? ? ? ?Signed: ?06/05/2022 Jowel Waltner ?04/05/2022, 7:03 PM ?  ?

## 2022-04-05 NOTE — Progress Notes (Signed)
Prescriptions for pain meds sent to pt preferred pharmacy for DC home this evening but pt's pharmacy has already closed at 1900. Spoke with Selena Batten, on-call NP regarding situation; per NP ok to hold DC for tonight. ? ?Pt and family agreeable to plan. ?

## 2022-04-05 NOTE — Progress Notes (Signed)
Page to on call provider placed via [place].  ?Pt is asking for provider to discuss results of head CT and inquiring about DC today. ?

## 2022-04-06 NOTE — Progress Notes (Signed)
Neurosurgery Service ?Progress Note ? ?Subjective: No acute events overnight, no diplopia, pain significantly better, no nausea, wants to go home ? ?Objective: ?Vitals:  ? 04/05/22 0034 04/05/22 0522 04/05/22 1956 04/06/22 9509  ?BP: 112/78 (!) 145/81 127/83 114/78  ?Pulse: 95 (!) 101 73 91  ?Resp: 18 18 18 17   ?Temp: 98.6 ?F (37 ?C) 98.3 ?F (36.8 ?C) 98.7 ?F (37.1 ?C) 98.3 ?F (36.8 ?C)  ?TempSrc: Oral Oral Oral Oral  ?SpO2: 96% 96% 100% 100%  ?Weight:      ?Height:      ? ? ?Physical Exam: ?Aox3, PERRL, EOMI, FS & SS, TM, strength 5/5x4, SILTx4, no dysmetria, incision c/d/i ? ?Assessment & Plan: ?26 y.o. woman s/p suboccipital craniectomy for chiari decompression, recovering well. ? ?-discharge home today ? ?Anaika Santillano A Talli Kimmer  ?04/06/22 ?7:15 AM ? ?

## 2022-04-06 NOTE — Progress Notes (Signed)
Discharge instructions given at this time. Pt verbalizes understanding. Awaiting family member to arrive for ride home. All belongings in belongings bags at pt bedside. Pt remains A&O and independent at baseline. ?

## 2022-04-25 NOTE — Progress Notes (Unsigned)
   CC:  headaches  Follow-up Visit  Last visit: 01/09/22  Brief HPI: 26 year old female with a history of Chiari malformation, anxiety, and depression who follows in clinic for headaches.   At her last visit nortriptyline was added.  Interval History: The patient underwent suboccipital decompression 04/03/22. She has had improvement in her headaches since the surgery. Has difficulty quantifying the frequency of her headaches. She will still occasionally get throbbing headaches. Takes ibuprofen as needed which helps somewhat but does not typically resolve the headache.  She is tolerating zonisamide without side effects. Does not think nortriptyline has made a difference with headaches or sleep.   Current Headache Regimen: Preventative: Zonisamide 200 mg daily, nortriptyline 50 mg QHS Abortive: ibuprofen   Prior Therapies                                  Topamax 50 mg daily Zonisamide 200 mg daily Nortriptyline 50 mg QHS  Physical Exam:   Vital Signs: BP 124/86   Pulse 72   Ht 5\' 7"  (1.702 m)   Wt 228 lb (103.4 kg)   BMI 35.71 kg/m  GENERAL:  well appearing, in no acute distress, alert  SKIN:  Color, texture, turgor normal. No rashes or lesions HEAD:  Normocephalic/atraumatic. RESP: normal respiratory effort MSK:  No gross joint deformities.   NEUROLOGICAL: Mental Status: Alert, oriented to person, place and time, Follows commands, and Speech fluent and appropriate. Cranial Nerves: PERRL, face symmetric, no dysarthria, hearing grossly intact Motor: moves all extremities equally Gait: normal-based.  IMPRESSION: 26 year old female with a history of Chiari malformation, anxiety, and depression who presents for follow up of headaches. She has had improvement in headaches since her decompression surgery earlier this month. Continues to have intermittent throbbing headaches. Discussed how she may continue to have migraine headaches after surgery. Will continue zonisamide for now  and start Maxalt as needed for rescue. Discussed increasing nortriptyline vs trying a new medication and she would like to stop nortriptyline. Will stop this and start Trazodone.  PLAN: -Prevention: Continue zonisamide 200 mg daily -Rescue: Start Maxalt 10 mg PRN. Patient will run medication by NSGY before starting it -Start trazodone 50 mg QHS for insomnia  Follow-up: 4 months  I spent a total of 24 minutes on the date of the service. Headache education was done. Discussed treatment options including preventive and acute medications. Discussed medication overuse headache and to limit use of acute treatments to no more than 2 days/week or 10 days/month. Discussed medication side effects, adverse reactions and drug interactions. Written educational materials and patient instructions outlining all of the above were given.  Genia Harold, MD 04/26/22 9:32 AM

## 2022-04-26 ENCOUNTER — Encounter: Payer: Self-pay | Admitting: Psychiatry

## 2022-04-26 ENCOUNTER — Ambulatory Visit: Payer: 59 | Admitting: Psychiatry

## 2022-04-26 VITALS — BP 124/86 | HR 72 | Ht 67.0 in | Wt 228.0 lb

## 2022-04-26 DIAGNOSIS — G935 Compression of brain: Secondary | ICD-10-CM | POA: Diagnosis not present

## 2022-04-26 DIAGNOSIS — G43009 Migraine without aura, not intractable, without status migrainosus: Secondary | ICD-10-CM | POA: Diagnosis not present

## 2022-04-26 MED ORDER — RIZATRIPTAN BENZOATE 10 MG PO TABS: 10.0000 mg | ORAL_TABLET | ORAL | 11 refills | Status: DC | PRN

## 2022-04-26 MED ORDER — TRAZODONE HCL 50 MG PO TABS: 50.0000 mg | ORAL_TABLET | Freq: Every day | ORAL | 3 refills | Status: DC

## 2022-04-26 MED ORDER — RIZATRIPTAN BENZOATE 10 MG PO TABS: 10.0000 mg | ORAL_TABLET | ORAL | 3 refills | Status: DC | PRN

## 2022-04-26 MED ORDER — ZONISAMIDE 100 MG PO CAPS
200.0000 mg | ORAL_CAPSULE | Freq: Every day | ORAL | 11 refills | Status: DC
Start: 2022-04-26 — End: 2023-05-21

## 2022-04-26 MED ORDER — TRAZODONE HCL 50 MG PO TABS: 50.0000 mg | ORAL_TABLET | Freq: Every day | ORAL | 11 refills | Status: DC

## 2022-04-26 NOTE — Patient Instructions (Signed)
Stop nortriptyline Start trazodone 50 mg at bedtime Start rizatriptan as needed for migraine headaches. Take at the onset of migraine. If headache recurs or does not fully resolve, you may take a second dose after 2 hours. Please avoid taking more than 2 days per week to avoid rebound headaches. Please check with Dr. Zada Finders before starting this medication

## 2022-04-27 ENCOUNTER — Inpatient Hospital Stay (HOSPITAL_COMMUNITY)
Admission: EM | Admit: 2022-04-27 | Discharge: 2022-05-01 | DRG: 030 | Disposition: A | Discharge status: Home / Self Care | Payer: 59 | Attending: Internal Medicine | Admitting: Internal Medicine

## 2022-04-27 ENCOUNTER — Emergency Department (HOSPITAL_COMMUNITY): Payer: 59

## 2022-04-27 ENCOUNTER — Encounter (HOSPITAL_COMMUNITY): Payer: Self-pay

## 2022-04-27 ENCOUNTER — Other Ambulatory Visit: Payer: Self-pay

## 2022-04-27 DIAGNOSIS — G935 Compression of brain: Secondary | ICD-10-CM | POA: Diagnosis present

## 2022-04-27 DIAGNOSIS — Q0702 Arnold-Chiari syndrome with hydrocephalus: Secondary | ICD-10-CM | POA: Diagnosis not present

## 2022-04-27 DIAGNOSIS — R519 Headache, unspecified: Secondary | ICD-10-CM | POA: Diagnosis present

## 2022-04-27 DIAGNOSIS — R42 Dizziness and giddiness: Principal | ICD-10-CM

## 2022-04-27 DIAGNOSIS — Z79899 Other long term (current) drug therapy: Secondary | ICD-10-CM

## 2022-04-27 DIAGNOSIS — D729 Disorder of white blood cells, unspecified: Secondary | ICD-10-CM

## 2022-04-27 DIAGNOSIS — Z87891 Personal history of nicotine dependence: Secondary | ICD-10-CM

## 2022-04-27 DIAGNOSIS — G919 Hydrocephalus, unspecified: Secondary | ICD-10-CM

## 2022-04-27 LAB — I-STAT BETA HCG BLOOD, ED (MC, WL, AP ONLY): I-stat hCG, quantitative: <5 m[IU]/mL (ref <5)

## 2022-04-27 LAB — CBC WITH DIFFERENTIAL/PLATELET
Abs Immature Granulocytes: 0.06 10*3/uL (ref 0.00–0.07)
Basophils Absolute: 0 10*3/uL (ref 0.0–0.1)
Basophils Relative: 0 %
Eosinophils Absolute: 0.1 10*3/uL (ref 0.0–0.5)
Eosinophils Relative: 1 %
HCT: 40.3 % (ref 36.0–46.0)
Hemoglobin: 13.3 g/dL (ref 12.0–15.0)
Immature Granulocytes: 0 %
Lymphocytes Relative: 22 %
Lymphs Abs: 3.6 10*3/uL (ref 0.7–4.0)
MCH: 29.3 pg (ref 26.0–34.0)
MCHC: 33 g/dL (ref 30.0–36.0)
MCV: 88.8 fL (ref 80.0–100.0)
Monocytes Absolute: 1 10*3/uL (ref 0.1–1.0)
Monocytes Relative: 6 %
Neutro Abs: 11.6 10*3/uL — ABNORMAL HIGH (ref 1.7–7.7)
Neutrophils Relative %: 71 %
Platelets: 356 10*3/uL (ref 150–400)
RBC: 4.54 MIL/uL (ref 3.87–5.11)
RDW: 13.3 % (ref 11.5–15.5)
WBC: 16.4 10*3/uL — ABNORMAL HIGH (ref 4.0–10.5)
nRBC: 0 % (ref 0.0–0.2)

## 2022-04-27 LAB — BASIC METABOLIC PANEL
Anion gap: 8 (ref 5–15)
BUN: 9 mg/dL (ref 6–20)
CO2: 23 mmol/L (ref 22–32)
Calcium: 9 mg/dL (ref 8.9–10.3)
Chloride: 107 mmol/L (ref 98–111)
Creatinine, Ser: 0.73 mg/dL (ref 0.44–1.00)
GFR, Estimated: >60 mL/min (ref >60)
Glucose, Bld: 112 mg/dL — ABNORMAL HIGH (ref 70–99)
Potassium: 3.7 mmol/L (ref 3.5–5.1)
Sodium: 138 mmol/L (ref 135–145)

## 2022-04-27 LAB — SEDIMENTATION RATE: Sed Rate: 9 mm/hr (ref 0–22)

## 2022-04-27 LAB — PROCALCITONIN: Procalcitonin: <0.1 ng/mL

## 2022-04-27 LAB — C-REACTIVE PROTEIN: CRP: 1.2 mg/dL — ABNORMAL HIGH (ref <1.0)

## 2022-04-27 MED ORDER — SODIUM CHLORIDE 0.9 % IV BOLUS
1000.0000 mL | Freq: Once | INTRAVENOUS | Status: AC
  Administered 2022-04-27: 1000 mL via INTRAVENOUS

## 2022-04-27 MED ORDER — DIPHENHYDRAMINE HCL 50 MG/ML IJ SOLN
25.0000 mg | Freq: Once | INTRAMUSCULAR | Status: AC
Start: 2022-04-27 — End: 2022-04-27
  Administered 2022-04-27: 25 mg via INTRAVENOUS
  Filled 2022-04-27: qty 1

## 2022-04-27 MED ORDER — KETOROLAC TROMETHAMINE 15 MG/ML IJ SOLN
15.0000 mg | Freq: Once | INTRAMUSCULAR | Status: AC
  Administered 2022-04-27: 15 mg via INTRAVENOUS
  Filled 2022-04-27: qty 1

## 2022-04-27 MED ORDER — PROCHLORPERAZINE EDISYLATE 10 MG/2ML IJ SOLN
10.0000 mg | Freq: Once | INTRAMUSCULAR | Status: AC
  Administered 2022-04-27: 10 mg via INTRAVENOUS
  Filled 2022-04-27: qty 2

## 2022-04-27 NOTE — ED Provider Triage Note (Signed)
Emergency Medicine Provider Triage Evaluation Note  Sabrina Diaz , a 26 y.o. female  was evaluated in triage.  Pt complains of dizziness over the last couple days.  Patient recently had neurosurgery for a Chiari malformation decompression with Dr. Johnsie Cancel 2 weeks ago.  She denies fevers but does report associated chills and swelling over the surgical site.  Review of Systems  Positive:  Negative: See above   Physical Exam  BP 128/88   Pulse 62   Temp 97.9 F (36.6 C) (Oral)   Resp 16   Ht 5\' 6"  (1.676 m)   Wt 93.4 kg   SpO2 98%   BMI 33.25 kg/m  Gen:   Awake, no distress   Resp:  Normal effort  MSK:   Moves extremities without difficulty  Other:    Medical Decision Making  Medically screening exam initiated at 6:09 PM.  Appropriate orders placed.  Bertram was informed that the remainder of the evaluation will be completed by another provider, this initial triage assessment does not replace that evaluation, and the importance of remaining in the ED until their evaluation is complete.     Gerald Leitz [place], [place] 04/27/22 [phone]

## 2022-04-27 NOTE — ED Notes (Signed)
Pt's HR dropped to 40, returned to 70s. EDP made aware.

## 2022-04-27 NOTE — ED Provider Notes (Signed)
[facility] EMERGENCY DEPARTMENT Provider Note   CSN: [id number] Arrival date & time: 04/27/22  1751     History  Chief Complaint  Patient presents with   Dizziness   Emesis    Sabrina Diaz is a 26 y.o. female.  HPI 26 year old female presents with occipital pressure and swelling as well as dizziness, vomiting, and chills.  She has been feeling this way for 2-3 days.  On 5/1 she had a Chiari malformation decompression by Dr. Zada Finders.  On 5/17 she was given a steroid Dosepak for some swelling postop.  She states this is similar to that but worse.  She has had some chills but no fever.  She has at times felt dizzy, especially when she stands up and feels like she has to hold onto the wall.  She denies any visual complaints except that her eyes feel like there is a lot of pressure behind them pushing them out and a little bit of photophobia.  She is also having some spasm in her neck which was previously treated with a muscle relaxer.  She denies any focal weakness though has had on and off right hand numbness since the surgery.  She has been taking ibuprofen and Tylenol postop but these are no longer working at this point.  Pain is an 8 out of 10.  She is currently nauseated.  Home Medications Prior to Admission medications   Medication Sig Start Date End Date Taking? Authorizing Provider  rizatriptan (MAXALT) 10 MG tablet Take 1 tablet (10 mg total) by mouth as needed for migraine. May repeat in 2 hours if needed 04/26/22   Genia Harold, MD  traZODone (DESYREL) 50 MG tablet Take 1 tablet (50 mg total) by mouth at bedtime. 04/26/22   Genia Harold, MD  zonisamide (ZONEGRAN) 100 MG capsule Take 2 capsules (200 mg total) by mouth daily. Take 100 mg (1 pill) daily for two weeks, then increase to 200 mg (2 pills) daily. 04/26/22   Genia Harold, MD      Allergies    Patient has no known allergies.    Review of Systems   Review of Systems  Constitutional:  Negative  for fever.  Eyes:  Positive for photophobia. Negative for visual disturbance.  Gastrointestinal:  Positive for nausea and vomiting.  Musculoskeletal:  Positive for neck pain.  Neurological:  Positive for dizziness and headaches. Negative for weakness.   Physical Exam Updated Vital Signs BP 132/74   Pulse 63   Temp 97.7 F (36.5 C) (Oral)   Resp 16   Ht '5\' 6"'  (1.676 m)   Wt 93.4 kg   SpO2 99%   BMI 33.25 kg/m  Physical Exam Vitals and nursing note reviewed.  Constitutional:      General: She is not in acute distress.    Appearance: She is well-developed. She is not ill-appearing or diaphoretic.  HENT:     Head: Normocephalic and atraumatic.  Eyes:     Extraocular Movements: Extraocular movements intact.     Pupils: Pupils are equal, round, and reactive to light.  Neck:     Comments: See picture. The redness surrounding incision is about the same as it was post op per patient and family. No tenderness but there is some appreciable swelling at occiput/base of neck. Cardiovascular:     Rate and Rhythm: Normal rate and regular rhythm.     Heart sounds: Normal heart sounds.  Pulmonary:     Effort: Pulmonary effort is normal.  Breath sounds: Normal breath sounds.  Abdominal:     General: There is no distension.  Skin:    General: Skin is warm and dry.  Neurological:     Mental Status: She is alert.     Comments: CN 3-12 grossly intact. 5/5 strength in all 4 extremities. Grossly normal sensation. Normal finger to nose.      ED Results / Procedures / Treatments   Labs (all labs ordered are listed, but only abnormal results are displayed) Labs Reviewed  CBC WITH DIFFERENTIAL/PLATELET - Abnormal; Notable for the following components:      Result Value   WBC 16.4 (*)    Neutro Abs 11.6 (*)    All other components within normal limits  BASIC METABOLIC PANEL - Abnormal; Notable for the following components:   Glucose, Bld 112 (*)    All other components within normal  limits  SEDIMENTATION RATE  PROCALCITONIN  PROCALCITONIN  C-REACTIVE PROTEIN  I-STAT BETA HCG BLOOD, ED ([facility], [facility], [facility] ONLY)    EKG None  Radiology CT Head Wo Contrast  Result Date: 04/27/2022 CLINICAL DATA:  Chiari decompression 04/03/2022. Headache. Dizziness EXAM: CT HEAD WITHOUT CONTRAST CT CERVICAL SPINE WITHOUT CONTRAST TECHNIQUE: Multidetector CT imaging of the head and cervical spine was performed following the standard protocol without intravenous contrast. Multiplanar CT image reconstructions of the cervical spine were also generated. RADIATION DOSE REDUCTION: This exam was performed according to the departmental dose-optimization program which includes automated exposure control, adjustment of the mA and/or kV according to patient size and/or use of iterative reconstruction technique. COMPARISON:  CT head 04/05/2022 FINDINGS: CT HEAD FINDINGS Brain: Postop suboccipital craniectomy for Chiari decompression. Suboccipital fluid collection has increased in size now measuring 39 x 50 mm on axial images. Low lying cerebellar tonsils unchanged. Ventricles are small in size, similar to the prior study. Interval development of low-density subdural fluid collection in the left hemisphere anteriorly and along the right tentorium. Small low-density fluid collection around the cerebellum bilaterally right greater than left. These are new findings since the prior study. No acute hemorrhage. There is crowding of the structures in the posterior fossa which has progressed from the preoperative MRI. This suggests intracranial hypotension from CSF leak. No acute infarct or mass. Vascular: Negative for hyperdense vessel Skull: Suboccipital craniectomy. Sinuses/Orbits: Paranasal sinuses clear. Small effusion right mastoid tip. Left mastoid clear. Negative orbit Other: None CT CERVICAL SPINE FINDINGS Alignment: Normal Skull base and vertebrae: Suboccipital craniectomy. Incomplete posterior arch of C1, probably  postsurgical. No cervical spine fracture. Soft tissues and spinal canal: Suboccipital fluid collection measuring 52 x 41 x 59 mm. This extends to the C4 level. Disc levels: Disc space height normal. No disc protrusion or spinal stenosis. Upper chest: Lung apices clear bilaterally. Other: None IMPRESSION: 1. Postop suboccipital craniectomy for Chiari decompression. Enlarging suboccipital fluid collection which may be due to CSF leak. 2. Interval development of low-density subdural fluid collections left frontal region, right tentorium and bilateral posterior fossa, likely due to intracranial hypotension. Basal cisterns are effaced due to intracranial hypotension. 3. No acute infarct or hemorrhage 4. No acute abnormality in the cervical spine. Electronically Signed   By: Franchot Gallo M.D.   On: 04/27/2022 20:09   CT Cervical Spine Wo Contrast  Result Date: 04/27/2022 CLINICAL DATA:  Chiari decompression 04/03/2022. Headache. Dizziness EXAM: CT HEAD WITHOUT CONTRAST CT CERVICAL SPINE WITHOUT CONTRAST TECHNIQUE: Multidetector CT imaging of the head and cervical spine was performed following the standard protocol without intravenous contrast.  Multiplanar CT image reconstructions of the cervical spine were also generated. RADIATION DOSE REDUCTION: This exam was performed according to the departmental dose-optimization program which includes automated exposure control, adjustment of the mA and/or kV according to patient size and/or use of iterative reconstruction technique. COMPARISON:  CT head 04/05/2022 FINDINGS: CT HEAD FINDINGS Brain: Postop suboccipital craniectomy for Chiari decompression. Suboccipital fluid collection has increased in size now measuring 39 x 50 mm on axial images. Low lying cerebellar tonsils unchanged. Ventricles are small in size, similar to the prior study. Interval development of low-density subdural fluid collection in the left hemisphere anteriorly and along the right tentorium. Small  low-density fluid collection around the cerebellum bilaterally right greater than left. These are new findings since the prior study. No acute hemorrhage. There is crowding of the structures in the posterior fossa which has progressed from the preoperative MRI. This suggests intracranial hypotension from CSF leak. No acute infarct or mass. Vascular: Negative for hyperdense vessel Skull: Suboccipital craniectomy. Sinuses/Orbits: Paranasal sinuses clear. Small effusion right mastoid tip. Left mastoid clear. Negative orbit Other: None CT CERVICAL SPINE FINDINGS Alignment: Normal Skull base and vertebrae: Suboccipital craniectomy. Incomplete posterior arch of C1, probably postsurgical. No cervical spine fracture. Soft tissues and spinal canal: Suboccipital fluid collection measuring 52 x 41 x 59 mm. This extends to the C4 level. Disc levels: Disc space height normal. No disc protrusion or spinal stenosis. Upper chest: Lung apices clear bilaterally. Other: None IMPRESSION: 1. Postop suboccipital craniectomy for Chiari decompression. Enlarging suboccipital fluid collection which may be due to CSF leak. 2. Interval development of low-density subdural fluid collections left frontal region, right tentorium and bilateral posterior fossa, likely due to intracranial hypotension. Basal cisterns are effaced due to intracranial hypotension. 3. No acute infarct or hemorrhage 4. No acute abnormality in the cervical spine. Electronically Signed   By: Franchot Gallo M.D.   On: 04/27/2022 20:09    Procedures Procedures    Medications Ordered in ED Medications  sodium chloride 0.9 % bolus 1,000 mL (0 mLs Intravenous Stopped 04/27/22 2300)  prochlorperazine (COMPAZINE) injection 10 mg (10 mg Intravenous Given 04/27/22 2218)  diphenhydrAMINE (BENADRYL) injection 25 mg (25 mg Intravenous Given 04/27/22 2218)  ketorolac (TORADOL) 15 MG/ML injection 15 mg (15 mg Intravenous Given 04/27/22 2258)    ED Course/ Medical Decision  Making/ A&P Clinical Course as of 04/27/22 2311  Thu Apr 27, 2022  2221 I discussed with neurosurgery, Weston Brass who recommends ESR, CRP, procalcitonin to help rule out infection.  No other imaging required.  Also asked for headache cocktail and see if we get her feeling better. [SG]    Clinical Course User Index [SG] Sherwood Gambler, MD                           Medical Decision Making Amount and/or Complexity of Data Reviewed Labs: ordered.  Risk Prescription drug management.   Labs interpreted by myself. Has a leukocytosis, but also had similar value on day of operation. CT head/c-spine images viewed by myself.  There is a fluid collection, unclear of the sterility.  She was given IV Compazine, Toradol, Benadryl and fluids as above.  Currently pending further lab work including ESR, CRP, procalcitonin.  Care transferred to Dr. Randal Buba.  Will need to reconsult neurosurgery.        Final Clinical Impression(s) / ED Diagnoses Final diagnoses:  None    Rx / DC Orders ED Discharge Orders  None         Sherwood Gambler, MD 04/27/22 2329

## 2022-04-27 NOTE — ED Triage Notes (Signed)
Pt arrived POV from home c/o swelling to the back of her neck, emesis, hot and sweaty. Pt states she had a Chiari decompression done on 5/1. Pt endorses increased eye pain and ear pain as well.

## 2022-04-28 ENCOUNTER — Encounter (HOSPITAL_COMMUNITY): Payer: Self-pay | Admitting: Emergency Medicine

## 2022-04-28 ENCOUNTER — Observation Stay (HOSPITAL_COMMUNITY): Payer: 59

## 2022-04-28 DIAGNOSIS — D729 Disorder of white blood cells, unspecified: Secondary | ICD-10-CM | POA: Diagnosis not present

## 2022-04-28 DIAGNOSIS — Q0702 Arnold-Chiari syndrome with hydrocephalus: Secondary | ICD-10-CM | POA: Diagnosis not present

## 2022-04-28 DIAGNOSIS — G935 Compression of brain: Secondary | ICD-10-CM

## 2022-04-28 DIAGNOSIS — G911 Obstructive hydrocephalus: Secondary | ICD-10-CM | POA: Diagnosis not present

## 2022-04-28 DIAGNOSIS — Z79899 Other long term (current) drug therapy: Secondary | ICD-10-CM | POA: Diagnosis not present

## 2022-04-28 DIAGNOSIS — R519 Headache, unspecified: Secondary | ICD-10-CM | POA: Diagnosis present

## 2022-04-28 DIAGNOSIS — Z87891 Personal history of nicotine dependence: Secondary | ICD-10-CM | POA: Diagnosis not present

## 2022-04-28 DIAGNOSIS — G919 Hydrocephalus, unspecified: Secondary | ICD-10-CM | POA: Diagnosis not present

## 2022-04-28 DIAGNOSIS — G039 Meningitis, unspecified: Secondary | ICD-10-CM | POA: Diagnosis not present

## 2022-04-28 DIAGNOSIS — R42 Dizziness and giddiness: Secondary | ICD-10-CM | POA: Diagnosis present

## 2022-04-28 LAB — CSF CELL COUNT WITH DIFFERENTIAL
Eosinophils, CSF: 0 % (ref 0–1)
Eosinophils, CSF: 1 % (ref 0–1)
Lymphs, CSF: 14 % — ABNORMAL LOW (ref 40–80)
Lymphs, CSF: 19 % — ABNORMAL LOW (ref 40–80)
Monocyte-Macrophage-Spinal Fluid: 35 % (ref 15–45)
Monocyte-Macrophage-Spinal Fluid: 38 % (ref 15–45)
RBC Count, CSF: 18 /mm3 — ABNORMAL HIGH
RBC Count, CSF: 26 /mm3 — ABNORMAL HIGH
Segmented Neutrophils-CSF: 46 % — ABNORMAL HIGH (ref 0–6)
Segmented Neutrophils-CSF: 47 % — ABNORMAL HIGH (ref 0–6)
Tube #: 1
Tube #: 4
WBC, CSF: 335 /mm3 (ref 0–5)
WBC, CSF: 383 /mm3 (ref 0–5)

## 2022-04-28 LAB — HIV ANTIBODY (ROUTINE TESTING W REFLEX): HIV Screen 4th Generation wRfx: NONREACTIVE

## 2022-04-28 LAB — GLUCOSE, CSF: Glucose, CSF: 65 mg/dL (ref 40–70)

## 2022-04-28 LAB — PROCALCITONIN: Procalcitonin: <0.1 ng/mL

## 2022-04-28 LAB — PROTEIN, CSF: Total  Protein, CSF: 81 mg/dL — ABNORMAL HIGH (ref 15–45)

## 2022-04-28 MED ORDER — LIDOCAINE HCL (PF) 1 % IJ SOLN
5.0000 mL | Freq: Once | INTRAMUSCULAR | Status: AC
  Administered 2022-04-28: 5 mL

## 2022-04-28 MED ORDER — HYDROMORPHONE HCL 1 MG/ML IJ SOLN
0.5000 mg | INTRAMUSCULAR | Status: DC | PRN
  Administered 2022-04-28 – 2022-04-30 (×11): 0.5 mg via INTRAVENOUS
  Filled 2022-04-28 (×11): qty 0.5

## 2022-04-28 MED ORDER — TRAZODONE HCL 50 MG PO TABS
50.0000 mg | ORAL_TABLET | Freq: Every day | ORAL | Status: DC
  Administered 2022-04-28 – 2022-04-30 (×3): 50 mg via ORAL
  Filled 2022-04-28 (×3): qty 1

## 2022-04-28 MED ORDER — METHOCARBAMOL 1000 MG/10ML IJ SOLN
1000.0000 mg | Freq: Once | INTRAVENOUS | Status: AC
  Administered 2022-04-28: 1000 mg via INTRAVENOUS
  Filled 2022-04-28: qty 10

## 2022-04-28 MED ORDER — ONDANSETRON HCL 4 MG PO TABS: 4.0000 mg | ORAL_TABLET | Freq: Four times a day (QID) | ORAL | Status: DC | PRN

## 2022-04-28 MED ORDER — MAGNESIUM SULFATE 2 GM/50ML IV SOLN
2.0000 g | Freq: Once | INTRAVENOUS | Status: AC
  Administered 2022-04-28: 2 g via INTRAVENOUS
  Filled 2022-04-28: qty 50

## 2022-04-28 MED ORDER — LIDOCAINE HCL 1 % IJ SOLN
10.0000 mL | Freq: Once | INTRAMUSCULAR | Status: AC
  Filled 2022-04-28: qty 10

## 2022-04-28 MED ORDER — LORAZEPAM 2 MG/ML IJ SOLN
1.0000 mg | Freq: Once | INTRAMUSCULAR | Status: AC
  Administered 2022-04-28: 1 mg via INTRAVENOUS
  Filled 2022-04-28: qty 1

## 2022-04-28 MED ORDER — SODIUM CHLORIDE 0.9 % IV BOLUS
500.0000 mL | Freq: Once | INTRAVENOUS | Status: AC
  Administered 2022-04-28: 500 mL via INTRAVENOUS

## 2022-04-28 MED ORDER — ZONISAMIDE 100 MG PO CAPS
100.0000 mg | ORAL_CAPSULE | Freq: Two times a day (BID) | ORAL | Status: DC
  Administered 2022-04-28 – 2022-05-01 (×7): 100 mg via ORAL
  Filled 2022-04-28 (×9): qty 1

## 2022-04-28 MED ORDER — ACETAMINOPHEN 325 MG PO TABS
650.0000 mg | ORAL_TABLET | Freq: Four times a day (QID) | ORAL | Status: DC | PRN
Start: 2022-04-28 — End: 2022-05-01
  Administered 2022-04-29 – 2022-05-01 (×2): 650 mg via ORAL
  Filled 2022-04-28 (×2): qty 2

## 2022-04-28 MED ORDER — OXYCODONE HCL 5 MG PO TABS
5.0000 mg | ORAL_TABLET | ORAL | Status: DC | PRN
  Administered 2022-04-28 – 2022-04-30 (×3): 5 mg via ORAL
  Filled 2022-04-28 (×4): qty 1

## 2022-04-28 MED ORDER — ACETAMINOPHEN 650 MG RE SUPP: 650.0000 mg | Freq: Four times a day (QID) | RECTAL | Status: DC | PRN

## 2022-04-28 MED ORDER — HEPARIN SODIUM (PORCINE) 5000 UNIT/ML IJ SOLN
5000.0000 [IU] | Freq: Three times a day (TID) | INTRAMUSCULAR | Status: DC
  Administered 2022-04-28: 5000 [IU] via SUBCUTANEOUS
  Filled 2022-04-28: qty 1

## 2022-04-28 MED ORDER — LIDOCAINE HCL (PF) 1 % IJ SOLN
INTRAMUSCULAR | Status: AC
  Administered 2022-04-28: 5 mL
  Filled 2022-04-28: qty 5

## 2022-04-28 MED ORDER — SODIUM CHLORIDE 0.9 % IV SOLN: 2.0000 g | Freq: Two times a day (BID) | INTRAVENOUS | Status: DC

## 2022-04-28 MED ORDER — SODIUM CHLORIDE 0.9 % IV SOLN
INTRAVENOUS | Status: AC
Start: 2022-04-28 — End: 2022-04-29

## 2022-04-28 MED ORDER — ONDANSETRON HCL 4 MG/2ML IJ SOLN
4.0000 mg | Freq: Four times a day (QID) | INTRAMUSCULAR | Status: DC | PRN
  Administered 2022-04-29 (×2): 4 mg via INTRAVENOUS
  Filled 2022-04-28 (×2): qty 2

## 2022-04-28 NOTE — Assessment & Plan Note (Signed)
S/p decompression on 04-03-2022.

## 2022-04-28 NOTE — Progress Notes (Addendum)
PROGRESS NOTE    JULIEANNE HADSALL  UPB:[id number] DOB: 03/26/1996 DOA: 04/27/2022 PCP: Ardeen Garland, PA-C    Brief Narrative:   Sabrina Diaz is a 26 y.o. female with past medical history significant for Arnold-Chiari malformation s/p occipital decompression on 04/03/2022 by Dr. Zada Finders who presented to [facility] ED on 5/25 with recurrent headaches.  Patient reports she was doing well postoperatively and finished her Medrol Dosepak roughly 2 days prior.  Since her headaches have progressively worsened since stopping the steroids and running out of her opiate medications.  Denies fever/chills.  Also reports episode of vomiting.  In the ED, temperature 97.9 F, HR 62, RR 16, BP 128/88, SPO2 98% on room air.  WBC 16.4, hemoglobin 13.3, platelets 356.  Sodium 138, potassium 3.7, chloride 107, CO2 23, glucose 112, BUN 9, creatinine 0.73.  CRP 1.2.  ESR 9.  Procalcitonin less than 0.10.  hCG negative.  CT head/C-spine without contrast with postoperative suboccipital craniectomy for Charlamb decompression, enlarging suboccipital fluid collection which may be due to CSF leak, interval development low-density subdural fluid collections left frontal region, right tentorium and bilateral posterior fossa likely due to intracranial hypotension, no acute infarct or hemorrhage no acute abnormality in the C-spine.  Patient received IV magnesium, IV Toradol, IV Robaxin and IV Compazine by EDP.  Neurosurgery was consulted.  Hospitalist consulted for admission for headache with concern of CSF leak in the setting of recent occipital decompression for Arnold-Chiari malformation.  Assessment & Plan:   Headache  Patient presenting to ED with progressive headache since stopping her postoperative steroids and opiates.  Recently underwent occipital decompression for Arnold-Chiari malformation. CT head/C-spine without contrast with postoperative suboccipital craniectomy for Charlamb decompression, enlarging suboccipital fluid  collection which may be due to CSF leak, interval development low-density subdural fluid collections left frontal region, right tentorium and bilateral posterior fossa likely due to intracranial hypotension, no acute infarct or hemorrhage no acute abnormality in the C-spine. --NS bolus followed by 75 mL/h --Zonisamide 200 mg BID --Zofran as needed nausea/vomiting --Oxycodone 5 mg every 4 hours.  Moderate pain --Dilaudid 0.5 mg IV every 2 hours as needed severe pain --Awaiting further recommendations from neurosurgery  Addendum 3:27 PM: Patient underwent lumbar puncture by IR this morning with CSF resulting 383 WBC.  Discussed with ID, Dr. Candiss Norse.  Also discussed with neurosurgery, Dr. Zada Finders; who believes that this elevated white count is likely due to postoperative inflammation and wants to hold off on antibiotics for now dependent on culture growth and remainder of cell count that is still pending.  Arnold-Chiari malformation Underwent occipital decompression on 04/03/2022 by Dr. Zada Finders  Obesity Body mass index is 34.02 kg/m.  Discussed with patient needs for aggressive lifestyle changes/weight loss as this complicates all facets of care.  Outpatient follow-up with PCP.     DVT prophylaxis: SCDs Start: 04/28/22 0152    Code Status: Full Code Family Communication: Husband present at bedside  Disposition Plan:  Level of care: Med-Surg Status is: Observation The patient remains OBS appropriate and will d/c before 2 midnights.    Consultants:  Neurosurgery  Procedures:  None  Antimicrobials:  None   Subjective: Patient seen examined bedside, resting comfortably.  Continues with headache, described as "all over".  Also complaining of lightheadedness/dizziness, and off balance with gait while walking to the bathroom overnight.  Denies visual changes.  No other specific complaints or concerns at this time.  Awaiting further neurosurgery recommendations.  Denies  fever/chills/night sweats, no current vomiting,  no diarrhea, no chest pain, no palpitations, no shortness of breath, no abdominal pain, no focal weakness, no fatigue, no paresthesias.  No acute events overnight per nursing staff.  Objective: Vitals:   04/28/22 0030 04/28/22 0100 04/28/22 0152 04/28/22 0500  BP: 111/75 123/77 (!) 131/93   Pulse: 66 64 61   Resp: 15 (!) 21    Temp:   98.3 F (36.8 C)   TempSrc:   Oral   SpO2: 99% 100% 100%   Weight:    95.6 kg  Height:        Intake/Output Summary (Last 24 hours) at 04/28/2022 0945 Last data filed at 04/28/2022 7680 Gross per 24 hour  Intake 134.93 ml  Output --  Net 134.93 ml   Filed Weights   04/27/22 1808 04/28/22 0500  Weight: 93.4 kg 95.6 kg    Examination:  Physical Exam: GEN: NAD, alert and oriented x 3, obese HEENT: NCAT, PERRL, EOMI, sclera clear, MMM PULM: CTAB w/o wheezes/crackles, normal respiratory effort, on room air CV: RRR w/o M/G/R GI: abd soft, NTND, NABS, no R/G/M MSK: no peripheral edema, muscle strength globally intact 5/5 bilateral upper/lower extremities NEURO: CN II-XII intact, no focal deficits, sensation to light touch intact PSYCH: normal mood/affect Integumentary: Posterior neck surgical incision site noted, no concerning erythema/fluctuance, no other concerning rashes/lesions/wounds     Data Reviewed: I have personally reviewed following labs and imaging studies  CBC: Recent Labs  Lab 04/27/22 1830  WBC 16.4*  NEUTROABS 11.6*  HGB 13.3  HCT 40.3  MCV 88.8  PLT 881   Basic Metabolic Panel: Recent Labs  Lab 04/27/22 1830  NA 138  K 3.7  CL 107  CO2 23  GLUCOSE 112*  BUN 9  CREATININE 0.73  CALCIUM 9.0   GFR: Estimated Creatinine Clearance: 125.2 mL/min (by C-G formula based on SCr of 0.73 mg/dL). Liver Function Tests: No results for input(s): AST, ALT, ALKPHOS, BILITOT, PROT, ALBUMIN in the last 168 hours. No results for input(s): LIPASE, AMYLASE in the last 168  hours. No results for input(s): AMMONIA in the last 168 hours. Coagulation Profile: No results for input(s): INR, PROTIME in the last 168 hours. Cardiac Enzymes: No results for input(s): CKTOTAL, CKMB, CKMBINDEX, TROPONINI in the last 168 hours. BNP (last 3 results) No results for input(s): PROBNP in the last 8760 hours. HbA1C: No results for input(s): HGBA1C in the last 72 hours. CBG: No results for input(s): GLUCAP in the last 168 hours. Lipid Profile: No results for input(s): CHOL, HDL, LDLCALC, TRIG, CHOLHDL, LDLDIRECT in the last 72 hours. Thyroid Function Tests: No results for input(s): TSH, T4TOTAL, FREET4, T3FREE, THYROIDAB in the last 72 hours. Anemia Panel: No results for input(s): VITAMINB12, FOLATE, FERRITIN, TIBC, IRON, RETICCTPCT in the last 72 hours. Sepsis Labs: Recent Labs  Lab 04/27/22 1830 04/28/22 0559  PROCALCITON <0.10 <0.10    Recent Results (from the past 240 hour(s))  Blood culture (routine x 2)     Status: None (Preliminary result)   Collection Time: 04/28/22 12:45 AM   Specimen: BLOOD RIGHT HAND  Result Value Ref Range Status   Specimen Description BLOOD RIGHT HAND  Final   Special Requests   Final    BOTTLES DRAWN AEROBIC AND ANAEROBIC Blood Culture adequate volume   Culture   Final    NO GROWTH < 12 HOURS Performed at [facility], [place] [address], [place], [place] [address]    Report Status PENDING  Incomplete  Blood culture (routine  x 2)     Status: None (Preliminary result)   Collection Time: 04/28/22 12:45 AM   Specimen: BLOOD LEFT HAND  Result Value Ref Range Status   Specimen Description BLOOD LEFT HAND  Final   Special Requests   Final    BOTTLES DRAWN AEROBIC AND ANAEROBIC Blood Culture adequate volume   Culture   Final    NO GROWTH < 12 HOURS Performed at [facility], [address] [place], [place] [address]    Report Status PENDING  Incomplete         Radiology Studies: CT Head Wo Contrast  Result Date:  04/27/2022 CLINICAL DATA:  Chiari decompression 04/03/2022. Headache. Dizziness EXAM: CT HEAD WITHOUT CONTRAST CT CERVICAL SPINE WITHOUT CONTRAST TECHNIQUE: Multidetector CT imaging of the head and cervical spine was performed following the standard protocol without intravenous contrast. Multiplanar CT image reconstructions of the cervical spine were also generated. RADIATION DOSE REDUCTION: This exam was performed according to the departmental dose-optimization program which includes automated exposure control, adjustment of the mA and/or kV according to patient size and/or use of iterative reconstruction technique. COMPARISON:  CT head 04/05/2022 FINDINGS: CT HEAD FINDINGS Brain: Postop suboccipital craniectomy for Chiari decompression. Suboccipital fluid collection has increased in size now measuring 39 x 50 mm on axial images. Low lying cerebellar tonsils unchanged. Ventricles are small in size, similar to the prior study. Interval development of low-density subdural fluid collection in the left hemisphere anteriorly and along the right tentorium. Small low-density fluid collection around the cerebellum bilaterally right greater than left. These are new findings since the prior study. No acute hemorrhage. There is crowding of the structures in the posterior fossa which has progressed from the preoperative MRI. This suggests intracranial hypotension from CSF leak. No acute infarct or mass. Vascular: Negative for hyperdense vessel Skull: Suboccipital craniectomy. Sinuses/Orbits: Paranasal sinuses clear. Small effusion right mastoid tip. Left mastoid clear. Negative orbit Other: None CT CERVICAL SPINE FINDINGS Alignment: Normal Skull base and vertebrae: Suboccipital craniectomy. Incomplete posterior arch of C1, probably postsurgical. No cervical spine fracture. Soft tissues and spinal canal: Suboccipital fluid collection measuring 52 x 41 x 59 mm. This extends to the C4 level. Disc levels: Disc space height normal.  No disc protrusion or spinal stenosis. Upper chest: Lung apices clear bilaterally. Other: None IMPRESSION: 1. Postop suboccipital craniectomy for Chiari decompression. Enlarging suboccipital fluid collection which may be due to CSF leak. 2. Interval development of low-density subdural fluid collections left frontal region, right tentorium and bilateral posterior fossa, likely due to intracranial hypotension. Basal cisterns are effaced due to intracranial hypotension. 3. No acute infarct or hemorrhage 4. No acute abnormality in the cervical spine. Electronically Signed   By: Franchot Gallo M.D.   On: 04/27/2022 20:09   CT Cervical Spine Wo Contrast  Result Date: 04/27/2022 CLINICAL DATA:  Chiari decompression 04/03/2022. Headache. Dizziness EXAM: CT HEAD WITHOUT CONTRAST CT CERVICAL SPINE WITHOUT CONTRAST TECHNIQUE: Multidetector CT imaging of the head and cervical spine was performed following the standard protocol without intravenous contrast. Multiplanar CT image reconstructions of the cervical spine were also generated. RADIATION DOSE REDUCTION: This exam was performed according to the departmental dose-optimization program which includes automated exposure control, adjustment of the mA and/or kV according to patient size and/or use of iterative reconstruction technique. COMPARISON:  CT head 04/05/2022 FINDINGS: CT HEAD FINDINGS Brain: Postop suboccipital craniectomy for Chiari decompression. Suboccipital fluid collection has increased in size now measuring 39 x 50 mm on axial images.  Low lying cerebellar tonsils unchanged. Ventricles are small in size, similar to the prior study. Interval development of low-density subdural fluid collection in the left hemisphere anteriorly and along the right tentorium. Small low-density fluid collection around the cerebellum bilaterally right greater than left. These are new findings since the prior study. No acute hemorrhage. There is crowding of the structures in the  posterior fossa which has progressed from the preoperative MRI. This suggests intracranial hypotension from CSF leak. No acute infarct or mass. Vascular: Negative for hyperdense vessel Skull: Suboccipital craniectomy. Sinuses/Orbits: Paranasal sinuses clear. Small effusion right mastoid tip. Left mastoid clear. Negative orbit Other: None CT CERVICAL SPINE FINDINGS Alignment: Normal Skull base and vertebrae: Suboccipital craniectomy. Incomplete posterior arch of C1, probably postsurgical. No cervical spine fracture. Soft tissues and spinal canal: Suboccipital fluid collection measuring 52 x 41 x 59 mm. This extends to the C4 level. Disc levels: Disc space height normal. No disc protrusion or spinal stenosis. Upper chest: Lung apices clear bilaterally. Other: None IMPRESSION: 1. Postop suboccipital craniectomy for Chiari decompression. Enlarging suboccipital fluid collection which may be due to CSF leak. 2. Interval development of low-density subdural fluid collections left frontal region, right tentorium and bilateral posterior fossa, likely due to intracranial hypotension. Basal cisterns are effaced due to intracranial hypotension. 3. No acute infarct or hemorrhage 4. No acute abnormality in the cervical spine. Electronically Signed   By: Franchot Gallo M.D.   On: 04/27/2022 20:09        Scheduled Meds:  traZODone  50 mg Oral QHS   zonisamide  100 mg Oral BID   Continuous Infusions:  sodium chloride 75 mL/hr at 04/28/22 0847     LOS: 0 days    Time spent: 48 minutes spent on chart review, discussion with nursing staff, consultants, updating family and interview/physical exam; more than 50% of that time was spent in counseling and/or coordination of care.    Alphonse Asbridge J [place] DO [facility] Available via Epic secure chat 7am-7pm After these hours, please refer to coverage provider listed on [email or web address] 04/28/2022, 9:45 AM

## 2022-04-28 NOTE — Subjective & Objective (Signed)
CC: headache HPI: 26 year old female with a history of Arnold Chiari malformation.,  Recurrent headaches, status post occipital decompression on 04/03/2022.  She has been doing well postoperatively.  She was started on a Medrol Dosepak due to headaches.  She finished her Medrol Dosepak about 2 days ago.  Her headaches have gotten worse since stopping steroids.  She denies any fever or chills.  She has had some vomiting.  She has had some eye pain.  She came to the ER for evaluation.  She has run out of opiate medications.  No fever or chills.  Vital signs temp 97.9 heart rate 62 blood pressure 128/88 satting 98% on room air.  White count 16.4 hemoglobin 13.3 platelets of 356 Sodium 138, potassium 3.7 chloride of 107 BUN of 9 creatinine 0.73 CRP slightly elevated 1.2 procalcitonin less than 0.10. Sed rate of 9  EDP discussed the case with neurosurgery APP on-call.  Blood cultures were obtained.  [facility] hospitalist contacted for admission.

## 2022-04-28 NOTE — H&P (Addendum)
History and Physical    Sabrina Diaz [id number] DOB: December 12, 1995 DOA: 04/27/2022  DOS: the patient was seen and examined on 04/27/2022  PCP: Ardeen Garland, PA-C   Patient coming from: Home  I have personally briefly reviewed patient's old medical records in [place]  CC: headache HPI: 26 year old female with a history of Arnold Chiari malformation.,  Recurrent headaches, status post occipital decompression on 04/03/2022.  She has been doing well postoperatively.  She was started on a Medrol Dosepak due to headaches.  She finished her Medrol Dosepak about 2 days ago.  Her headaches have gotten worse since stopping steroids.  She denies any fever or chills.  She has had some vomiting.  She has had some eye pain.  She came to the ER for evaluation.  She has run out of opiate medications.  No fever or chills.  Vital signs temp 97.9 heart rate 62 blood pressure 128/88 satting 98% on room air.  White count 16.4 hemoglobin 13.3 platelets of 356 Sodium 138, potassium 3.7 chloride of 107 BUN of 9 creatinine 0.73 CRP slightly elevated 1.2 procalcitonin less than 0.10. Sed rate of 9  EDP discussed the case with neurosurgery APP on-call.  Blood cultures were obtained.  [facility] contacted for admission.    ED Course: CT head CT and CT cervical spine shows postop suboccipital craniectomy.  There is a suboccipital fluid collection measuring 39 x 50 mm.  This is increased in size since her CT scan dated 04/05/2022.  Concern for possible CSF leak.  Review of Systems:  Review of Systems  Constitutional:  Negative for chills and fever.  HENT: Negative.  Negative for hearing loss.   Eyes:  Negative for blurred vision.  Respiratory: Negative.    Cardiovascular: Negative.   Gastrointestinal:  Positive for nausea and vomiting.  Genitourinary: Negative.   Musculoskeletal: Negative.   Skin: Negative.   Neurological:  Positive for dizziness and headaches.       Restless  legs  Endo/Heme/Allergies: Negative.   All other systems reviewed and are negative.  Past Medical History:  Diagnosis Date   Chiari malformation type I ([place])    Chronic headaches     Past Surgical History:  Procedure Laterality Date   SUBOCCIPITAL CRANIECTOMY CERVICAL LAMINECTOMY N/A 04/03/2022   Procedure: Chiari decompression;  Surgeon: Judith Part, MD;  Location: [facility];  Service: Neurosurgery;  Laterality: N/A;  RM 21     reports that she quit smoking about 9 months ago. Her smoking use included cigarettes. She has never used smokeless tobacco. She reports current alcohol use. She reports that she does not currently use drugs after having used the following drugs: Marijuana.  No Known Allergies  History reviewed. No pertinent family history.  Prior to Admission medications   Medication Sig Start Date End Date Taking? Authorizing Provider  acetaminophen (TYLENOL) 500 MG tablet Take 1,000 mg by mouth every 6 (six) hours as needed for moderate pain or headache.   Yes [provider]  ibuprofen (ADVIL) 200 MG tablet Take 800 mg by mouth every 6 (six) hours as needed for moderate pain or headache.   Yes [provider]  traZODone (DESYREL) 50 MG tablet Take 1 tablet (50 mg total) by mouth at bedtime. 04/26/22  Yes Genia Harold, MD  zonisamide (ZONEGRAN) 100 MG capsule Take 2 capsules (200 mg total) by mouth daily. Take 100 mg (1 pill) daily for two weeks, then increase to 200 mg (2 pills) daily. Patient  taking differently: Take 100 mg by mouth 2 (two) times daily. 04/26/22  Yes Ocie Doyne, MD  rizatriptan (MAXALT) 10 MG tablet Take 1 tablet (10 mg total) by mouth as needed for migraine. May repeat in 2 hours if needed 04/26/22   Ocie Doyne, MD    Physical Exam: Vitals:   04/27/22 2300 04/27/22 2345 04/28/22 0030 04/28/22 0100  BP: 132/74 130/77 111/75 123/77  Pulse: 63 62 66 64  Resp: 16 20 15  (!) 21  Temp:      TempSrc:      SpO2: 99% 100% 99%  100%  Weight:      Height:        Physical Exam Vitals and nursing note reviewed.  Constitutional:      General: She is not in acute distress.    Appearance: Normal appearance. She is obese. She is not ill-appearing, toxic-appearing or diaphoretic.  HENT:     Head: Normocephalic and atraumatic.     Nose: No rhinorrhea.  Eyes:     General: No scleral icterus. Neck:      Comments: Midline occipital/cervical incision. No drainage. Clean, dry and intacted. Cardiovascular:     Rate and Rhythm: Normal rate and regular rhythm.     Pulses: Normal pulses.  Pulmonary:     Effort: Pulmonary effort is normal. No respiratory distress.     Breath sounds: Normal breath sounds. No wheezing or rales.  Abdominal:     General: Bowel sounds are normal. There is no distension.     Palpations: Abdomen is soft. There is no mass.     Tenderness: There is no abdominal tenderness. There is no guarding.  Musculoskeletal:     Right lower leg: No edema.     Left lower leg: No edema.  Skin:    General: Skin is warm and dry.     Capillary Refill: Capillary refill takes less than 2 seconds.  Neurological:     General: No focal deficit present.     Mental Status: She is alert and oriented to person, place, and time.     Labs on Admission: I have personally reviewed following labs and imaging studies  CBC: Recent Labs  Lab 04/27/22 1830  WBC 16.4*  NEUTROABS 11.6*  HGB 13.3  HCT 40.3  MCV 88.8  PLT 356   Basic Metabolic Panel: Recent Labs  Lab 04/27/22 1830  NA 138  K 3.7  CL 107  CO2 23  GLUCOSE 112*  BUN 9  CREATININE 0.73  CALCIUM 9.0   GFR: Estimated Creatinine Clearance: 123.7 mL/min (by C-G formula based on SCr of 0.73 mg/dL). Liver Function Tests: No results for input(s): AST, ALT, ALKPHOS, BILITOT, PROT, ALBUMIN in the last 168 hours. No results for input(s): LIPASE, AMYLASE in the last 168 hours. No results for input(s): AMMONIA in the last 168 hours. Coagulation  Profile: No results for input(s): INR, PROTIME in the last 168 hours. Cardiac Enzymes: No results for input(s): CKTOTAL, CKMB, CKMBINDEX, TROPONINI, TROPONINIHS in the last 168 hours. BNP (last 3 results) No results for input(s): PROBNP in the last 8760 hours. HbA1C: No results for input(s): HGBA1C in the last 72 hours. CBG: No results for input(s): GLUCAP in the last 168 hours. Lipid Profile: No results for input(s): CHOL, HDL, LDLCALC, TRIG, CHOLHDL, LDLDIRECT in the last 72 hours. Thyroid Function Tests: No results for input(s): TSH, T4TOTAL, FREET4, T3FREE, THYROIDAB in the last 72 hours. Anemia Panel: No results for input(s): VITAMINB12, FOLATE, FERRITIN, TIBC, IRON,  RETICCTPCT in the last 72 hours. Urine analysis: No results found for: COLORURINE, APPEARANCEUR, LABSPEC, PHURINE, GLUCOSEU, HGBUR, BILIRUBINUR, KETONESUR, PROTEINUR, UROBILINOGEN, NITRITE, LEUKOCYTESUR  Radiological Exams on Admission: I have personally reviewed images CT Head Wo Contrast  Result Date: 04/27/2022 CLINICAL DATA:  Chiari decompression 04/03/2022. Headache. Dizziness EXAM: CT HEAD WITHOUT CONTRAST CT CERVICAL SPINE WITHOUT CONTRAST TECHNIQUE: Multidetector CT imaging of the head and cervical spine was performed following the standard protocol without intravenous contrast. Multiplanar CT image reconstructions of the cervical spine were also generated. RADIATION DOSE REDUCTION: This exam was performed according to the departmental dose-optimization program which includes automated exposure control, adjustment of the mA and/or kV according to patient size and/or use of iterative reconstruction technique. COMPARISON:  CT head 04/05/2022 FINDINGS: CT HEAD FINDINGS Brain: Postop suboccipital craniectomy for Chiari decompression. Suboccipital fluid collection has increased in size now measuring 39 x 50 mm on axial images. Low lying cerebellar tonsils unchanged. Ventricles are small in size, similar to the prior study.  Interval development of low-density subdural fluid collection in the left hemisphere anteriorly and along the right tentorium. Small low-density fluid collection around the cerebellum bilaterally right greater than left. These are new findings since the prior study. No acute hemorrhage. There is crowding of the structures in the posterior fossa which has progressed from the preoperative MRI. This suggests intracranial hypotension from CSF leak. No acute infarct or mass. Vascular: Negative for hyperdense vessel Skull: Suboccipital craniectomy. Sinuses/Orbits: Paranasal sinuses clear. Small effusion right mastoid tip. Left mastoid clear. Negative orbit Other: None CT CERVICAL SPINE FINDINGS Alignment: Normal Skull base and vertebrae: Suboccipital craniectomy. Incomplete posterior arch of C1, probably postsurgical. No cervical spine fracture. Soft tissues and spinal canal: Suboccipital fluid collection measuring 52 x 41 x 59 mm. This extends to the C4 level. Disc levels: Disc space height normal. No disc protrusion or spinal stenosis. Upper chest: Lung apices clear bilaterally. Other: None IMPRESSION: 1. Postop suboccipital craniectomy for Chiari decompression. Enlarging suboccipital fluid collection which may be due to CSF leak. 2. Interval development of low-density subdural fluid collections left frontal region, right tentorium and bilateral posterior fossa, likely due to intracranial hypotension. Basal cisterns are effaced due to intracranial hypotension. 3. No acute infarct or hemorrhage 4. No acute abnormality in the cervical spine. Electronically Signed   By: Franchot Gallo M.D.   On: 04/27/2022 20:09   CT Cervical Spine Wo Contrast  Result Date: 04/27/2022 CLINICAL DATA:  Chiari decompression 04/03/2022. Headache. Dizziness EXAM: CT HEAD WITHOUT CONTRAST CT CERVICAL SPINE WITHOUT CONTRAST TECHNIQUE: Multidetector CT imaging of the head and cervical spine was performed following the standard protocol without  intravenous contrast. Multiplanar CT image reconstructions of the cervical spine were also generated. RADIATION DOSE REDUCTION: This exam was performed according to the departmental dose-optimization program which includes automated exposure control, adjustment of the mA and/or kV according to patient size and/or use of iterative reconstruction technique. COMPARISON:  CT head 04/05/2022 FINDINGS: CT HEAD FINDINGS Brain: Postop suboccipital craniectomy for Chiari decompression. Suboccipital fluid collection has increased in size now measuring 39 x 50 mm on axial images. Low lying cerebellar tonsils unchanged. Ventricles are small in size, similar to the prior study. Interval development of low-density subdural fluid collection in the left hemisphere anteriorly and along the right tentorium. Small low-density fluid collection around the cerebellum bilaterally right greater than left. These are new findings since the prior study. No acute hemorrhage. There is crowding of the structures in the posterior fossa which has progressed  from the preoperative MRI. This suggests intracranial hypotension from CSF leak. No acute infarct or mass. Vascular: Negative for hyperdense vessel Skull: Suboccipital craniectomy. Sinuses/Orbits: Paranasal sinuses clear. Small effusion right mastoid tip. Left mastoid clear. Negative orbit Other: None CT CERVICAL SPINE FINDINGS Alignment: Normal Skull base and vertebrae: Suboccipital craniectomy. Incomplete posterior arch of C1, probably postsurgical. No cervical spine fracture. Soft tissues and spinal canal: Suboccipital fluid collection measuring 52 x 41 x 59 mm. This extends to the C4 level. Disc levels: Disc space height normal. No disc protrusion or spinal stenosis. Upper chest: Lung apices clear bilaterally. Other: None IMPRESSION: 1. Postop suboccipital craniectomy for Chiari decompression. Enlarging suboccipital fluid collection which may be due to CSF leak. 2. Interval development of  low-density subdural fluid collections left frontal region, right tentorium and bilateral posterior fossa, likely due to intracranial hypotension. Basal cisterns are effaced due to intracranial hypotension. 3. No acute infarct or hemorrhage 4. No acute abnormality in the cervical spine. Electronically Signed   By: Franchot Gallo M.D.   On: 04/27/2022 20:09    EKG: My personal interpretation of EKG shows: no EKG  Assessment/Plan Principal Problem:   Headache Active Problems:   Chiari I malformation ([facility])    Assessment and Plan: * Headache Observation medical bed. Neurosurgery APP aware of pt's admission and CT findings. Possible CSF leak noted on CT head. Vs post-op seroma.   Will await neurosurgery consult to evaluate her imaging. Labs reassuring that she does not have infection. Will treat headache with IV robaxin 1000 mg x 1. Prn 5 mg oxycodone for moderate pain. 0.5 mg IV dilaudid for severe pain.  Chiari I malformation ([place]) S/p decompression on 04-03-2022.   DVT prophylaxis: SQ Heparin Code Status: Full Code Family Communication: discussed with pt and husband jason at bedside Disposition Plan: return home  Consults called: EDP has consulted neurosurgery  Admission status: Observation, Med-Surg   Kristopher Oppenheim, DO [facility] 04/28/2022, 1:18 AM

## 2022-04-28 NOTE — Assessment & Plan Note (Addendum)
Observation medical bed. Neurosurgery APP aware of pt's admission and CT findings. Possible CSF leak noted on CT head. Vs post-op seroma.   Will await neurosurgery consult to evaluate her imaging. Labs reassuring that she does not have infection. Will treat headache with IV robaxin 1000 mg x 1. Prn 5 mg oxycodone for moderate pain. 0.5 mg IV dilaudid for severe pain.

## 2022-04-28 NOTE — Consult Note (Signed)
Neurosurgery Consultation  Reason for Consult: Post-op chiari decompression, new headaches / N / V Referring Physician: [place]  CC: Headaches  HPI: This is a 26 y.o. woman s/p recent chiari decompression. Post-operatively, complained of some mild intermittent diplopia that resolved without intervention, but prompted repeat imaging which was normal at that time without pseudomeningocele. She was seen in clinic due to some fullness and worsening headaches, started steroids which helped some, planned for repeat [facility] but she presented to the ED prior to that. She has had worsening headaches, feels a great deal of pressure like her 'eyes are popping out of my head', no changes with position / sitting up or down, some nausea and vomiting previously but none today thus far, no changes in vision but does feel a lot of pressure in her eyes, no new ataxia but less overall activity because of the headache. No new neck pain or stiffness, no photophobia, some aural fullness but no phonophobia.    ROS: A 14 point ROS was performed and is negative except as noted in the HPI.   PMHx:  Past Medical History:  Diagnosis Date   Chiari malformation type I ([place])    Chronic headaches    FamHx: History reviewed. No pertinent family history. SocHx:  reports that she quit smoking about 9 months ago. Her smoking use included cigarettes. She has never used smokeless tobacco. She reports current alcohol use. She reports that she does not currently use drugs after having used the following drugs: Marijuana.  Exam: Vital signs in last 24 hours: Temp:  [97.7 F (36.5 C)-98.3 F (36.8 C)] 98.3 F (36.8 C) (05/26 0152) Pulse Rate:  [61-71] 61 (05/26 0152) Resp:  [15-21] 21 (05/26 0100) BP: (111-132)/(74-93) 131/93 (05/26 0152) SpO2:  [98 %-100 %] 100 % (05/26 0152) Weight:  [93.4 kg-95.6 kg] 95.6 kg (05/26 0500) General: Awake, alert, cooperative, lying in bed in NAD, appears acutely ill and uncomfortable but not  toxic Head: Normocephalic, posterior cervical / CV junction incision with no clear evidence of drainage, no erythema / induration HEENT: Neck supple Pulmonary: breathing room air comfortably, no evidence of increased work of breathing Cardiac: RRR Abdomen: S NT ND Extremities: Warm and well perfused x4 Neuro: AOx3, PERRL, EOMI, FS Strength 5/5 x4, SILTx4, no dysmetria, +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 woman s/p prior chiari decompression, now with severe headaches. 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 Preop MRI brain was contrasted and did not show any meningeal enhancement, did show enlarged tonsils as opposed to descent of normal-sized tonsils. Would not expect a tense pseudomeningocele in the setting of intracranial hypotension, most likely diagnosis at this point is post-op hydrocephalus with so-called 'external hydrocephalus'. Clinically, symptoms / exam are very consistent with hydrocephalus and not intracranial hypotension.  -bedside LP, would like to use opening pressure to confirm high ICPs given the subdurals and r/o infection -if she improves with the LP and has recurrence of Sx, then likely shunt placement tomorrow. Will have to discuss with the patient further should that occur -please call with any concerns or questions  Judith Part, MD 04/28/22 9:12 AM [facility]

## 2022-04-28 NOTE — Plan of Care (Addendum)
Paged per abnormal LP results: 335 wbc and 18 rbc , rest of work up pending.  26 YF with HX of Arnold Chiari malformation SP occipital decompression on 5/1. Start vancomycin and cefepime Follow rest of work up which is pending Formal ID consult tomorrow

## 2022-04-28 NOTE — Progress Notes (Signed)
   04/28/22 1530  Provider Notification  Provider Name/Title Eric [place] DO  Date Provider Notified 04/28/22  Time Provider Notified 1513  Method of Notification Page  Notification Reason Critical result  Test performed and critical result LP CSF (Bottle 1 WBC 335 / Bottle 4 WBC 383)  Date Critical Result Received 04/28/22  Time Critical Result Received 1511  Provider response See new orders;Evaluate remotely  Date of Provider Response 04/28/22  Time of Provider Response 1515

## 2022-04-28 NOTE — Progress Notes (Signed)
Pt off unit for a procedure. P. Amo Laruen Risser RN 

## 2022-04-28 NOTE — Progress Notes (Signed)
PREOP DIAGNOSIS: Pseudomeningocele  POSTOP DIAGNOSIS: Same  PROCEDURE: Diagnostic lumbar puncture  SURGEON: Autumn Patty, MD  ASSISTANT: None  ANESTHESIA: IV sedation with local  EBL: Minimal  SPECIMENS: none  COMPLICATIONS: Failed attempt 2/2 body habitus  CONDITION: stable  HISTORY: Sabrina Diaz is a 26 y.o. female woman in whom I previously performed a chiari decompression. She returned with a large pseudomeningocele, headaches, and nausea. Given that her chiari is decompressed, I recommended LP to get an opening pressure to evaluate for hydrocephalus as well as rule out post-op infection.   PROCEDURE IN DETAIL: Consent was obtained. The patient was positioned in the right lateral decubitus position. The area was prepped and draped in a sterile fashion and a time out was performed. The intercristal plane and spinous processes were palpated and the localized interspace was injected with local anesthesia in the midline. A spinal needle was inserted fully and just barely contacted the lamina, needle was not long enough given the pt's body habitus. Needle therefore removed, will have to be done in IR.

## 2022-04-28 NOTE — Progress Notes (Signed)
Pt back to unit from procedure. Pt alert and verbally responsive. Pt to remain on bedrest for 4hrs till 1830 per pt. Pt back LP site unremarkable with band aid which remains clean, dry and intact. Pt denies any pain and resting comfortably in bed with call light within reach. Family member at bedside. Will continue to closely monitor pt. Dionne Bucy RN   04/28/22 1450  Vitals  Temp 98.5 F (36.9 C)  Temp Source Oral  BP 106/73  MAP (mmHg) 83  BP Location Right Arm  BP Method Automatic  Patient Position (if appropriate) Lying  Pulse Rate 92  Pulse Rate Source Monitor  Resp 18  MEWS COLOR  MEWS Score Color Green  Oxygen Therapy  SpO2 97 %  O2 Device Room Air  Pain Assessment  Pain Scale 0-10  Pain Score 0  MEWS Score  MEWS Temp 0  MEWS Systolic 0  MEWS Pulse 0  MEWS RR 0  MEWS LOC 0  MEWS Score 0

## 2022-04-28 NOTE — Progress Notes (Signed)
Successful image guided LP from L2-L3 level. 10 mL Clear CSF obtained for labs. Opening pressure 29, Closing 20  No complications.  Brayton El PA-C Interventional Radiology 04/28/2022 2:32 PM

## 2022-04-29 ENCOUNTER — Inpatient Hospital Stay (HOSPITAL_COMMUNITY): Payer: 59 | Admitting: Anesthesiology

## 2022-04-29 ENCOUNTER — Encounter (HOSPITAL_COMMUNITY)
Admission: EM | Disposition: A | Discharge status: Home / Self Care | Payer: Self-pay | Source: Home / Self Care | Attending: Internal Medicine

## 2022-04-29 ENCOUNTER — Inpatient Hospital Stay (HOSPITAL_COMMUNITY): Payer: 59

## 2022-04-29 ENCOUNTER — Other Ambulatory Visit: Payer: Self-pay

## 2022-04-29 ENCOUNTER — Encounter (HOSPITAL_COMMUNITY): Payer: Self-pay | Admitting: Internal Medicine

## 2022-04-29 DIAGNOSIS — G039 Meningitis, unspecified: Secondary | ICD-10-CM

## 2022-04-29 DIAGNOSIS — G919 Hydrocephalus, unspecified: Secondary | ICD-10-CM

## 2022-04-29 DIAGNOSIS — D729 Disorder of white blood cells, unspecified: Secondary | ICD-10-CM

## 2022-04-29 HISTORY — PX: VENTRICULOPERITONEAL SHUNT: SHX204

## 2022-04-29 LAB — SURGICAL PCR SCREEN
MRSA, PCR: NEGATIVE
Staphylococcus aureus: NEGATIVE

## 2022-04-29 LAB — BASIC METABOLIC PANEL
Anion gap: 7 (ref 5–15)
BUN: 10 mg/dL (ref 6–20)
CO2: 22 mmol/L (ref 22–32)
Calcium: 8.7 mg/dL — ABNORMAL LOW (ref 8.9–10.3)
Chloride: 107 mmol/L (ref 98–111)
Creatinine, Ser: 0.75 mg/dL (ref 0.44–1.00)
GFR, Estimated: >60 mL/min (ref >60)
Glucose, Bld: 118 mg/dL — ABNORMAL HIGH (ref 70–99)
Potassium: 3.7 mmol/L (ref 3.5–5.1)
Sodium: 136 mmol/L (ref 135–145)

## 2022-04-29 LAB — PROCALCITONIN: Procalcitonin: <0.1 ng/mL

## 2022-04-29 SURGERY — SHUNT INSERTION VENTRICULAR-PERITONEAL
Anesthesia: General | Site: Spine Lumbar

## 2022-04-29 MED ORDER — VANCOMYCIN HCL 10 G IV SOLR
2250.0000 mg | Freq: Once | INTRAVENOUS | Status: AC
  Administered 2022-04-29: 2250 mg via INTRAVENOUS
  Filled 2022-04-29: qty 2250

## 2022-04-29 MED ORDER — ONDANSETRON HCL 4 MG/2ML IJ SOLN
INTRAMUSCULAR | Status: AC
  Filled 2022-04-29: qty 2

## 2022-04-29 MED ORDER — DEXAMETHASONE SODIUM PHOSPHATE 10 MG/ML IJ SOLN
INTRAMUSCULAR | Status: DC | PRN
Start: 2022-04-29 — End: 2022-04-29
  Administered 2022-04-29: 5 mg via INTRAVENOUS

## 2022-04-29 MED ORDER — PHENYLEPHRINE 80 MCG/ML (10ML) SYRINGE FOR IV PUSH (FOR BLOOD PRESSURE SUPPORT)
PREFILLED_SYRINGE | INTRAVENOUS | Status: DC | PRN
  Administered 2022-04-29: 160 ug via INTRAVENOUS
  Administered 2022-04-29 (×2): 80 ug via INTRAVENOUS
  Administered 2022-04-29 (×2): 160 ug via INTRAVENOUS

## 2022-04-29 MED ORDER — PROPOFOL 10 MG/ML IV BOLUS
INTRAVENOUS | Status: DC | PRN
Start: 2022-04-29 — End: 2022-04-29
  Administered 2022-04-29: 120 mg via INTRAVENOUS

## 2022-04-29 MED ORDER — LIDOCAINE-EPINEPHRINE 1 %-1:100000 IJ SOLN
INTRAMUSCULAR | Status: AC
  Filled 2022-04-29: qty 1

## 2022-04-29 MED ORDER — ROCURONIUM BROMIDE 10 MG/ML (PF) SYRINGE
PREFILLED_SYRINGE | INTRAVENOUS | Status: AC
  Filled 2022-04-29: qty 10

## 2022-04-29 MED ORDER — SODIUM CHLORIDE 0.9 % IV SOLN: INTRAVENOUS | Status: DC

## 2022-04-29 MED ORDER — DEXAMETHASONE SODIUM PHOSPHATE 10 MG/ML IJ SOLN
INTRAMUSCULAR | Status: AC
  Filled 2022-04-29: qty 1

## 2022-04-29 MED ORDER — CHLORHEXIDINE GLUCONATE 0.12 % MT SOLN: 15.0000 mL | Freq: Once | OROMUCOSAL | Status: DC

## 2022-04-29 MED ORDER — CHLORHEXIDINE GLUCONATE 0.12 % MT SOLN
OROMUCOSAL | Status: AC
  Filled 2022-04-29: qty 15

## 2022-04-29 MED ORDER — LIDOCAINE 2% (20 MG/ML) 5 ML SYRINGE
INTRAMUSCULAR | Status: DC | PRN
  Administered 2022-04-29: 100 mg via INTRAVENOUS

## 2022-04-29 MED ORDER — LACTATED RINGERS IV SOLN: INTRAVENOUS | Status: DC

## 2022-04-29 MED ORDER — LIDOCAINE-EPINEPHRINE 1 %-1:100000 IJ SOLN
INTRAMUSCULAR | Status: DC | PRN
  Administered 2022-04-29: 10 mL

## 2022-04-29 MED ORDER — ACETAMINOPHEN 10 MG/ML IV SOLN
INTRAVENOUS | Status: AC
  Filled 2022-04-29: qty 100

## 2022-04-29 MED ORDER — OXYCODONE HCL 5 MG/5ML PO SOLN: 5.0000 mg | Freq: Once | ORAL | Status: DC | PRN

## 2022-04-29 MED ORDER — ORAL CARE MOUTH RINSE: 15.0000 mL | Freq: Once | OROMUCOSAL | Status: DC

## 2022-04-29 MED ORDER — 0.9 % SODIUM CHLORIDE (POUR BTL) OPTIME
TOPICAL | Status: DC | PRN
  Administered 2022-04-29: 1000 mL

## 2022-04-29 MED ORDER — DEXMEDETOMIDINE (PRECEDEX) IN NS 20 MCG/5ML (4 MCG/ML) IV SYRINGE
PREFILLED_SYRINGE | INTRAVENOUS | Status: DC | PRN
  Administered 2022-04-29: 12 ug via INTRAVENOUS
  Administered 2022-04-29: 8 ug via INTRAVENOUS

## 2022-04-29 MED ORDER — FENTANYL CITRATE (PF) 250 MCG/5ML IJ SOLN
INTRAMUSCULAR | Status: AC
  Filled 2022-04-29: qty 5

## 2022-04-29 MED ORDER — HYDROMORPHONE HCL 1 MG/ML IJ SOLN
INTRAMUSCULAR | Status: AC
  Filled 2022-04-29: qty 1

## 2022-04-29 MED ORDER — PROPOFOL 10 MG/ML IV BOLUS
INTRAVENOUS | Status: AC
  Filled 2022-04-29: qty 20

## 2022-04-29 MED ORDER — PHENYLEPHRINE 80 MCG/ML (10ML) SYRINGE FOR IV PUSH (FOR BLOOD PRESSURE SUPPORT)
PREFILLED_SYRINGE | INTRAVENOUS | Status: AC
  Filled 2022-04-29: qty 10

## 2022-04-29 MED ORDER — DEXMEDETOMIDINE (PRECEDEX) IN NS 20 MCG/5ML (4 MCG/ML) IV SYRINGE: PREFILLED_SYRINGE | INTRAVENOUS | Status: DC | PRN

## 2022-04-29 MED ORDER — ONDANSETRON HCL 4 MG/2ML IJ SOLN: 4.0000 mg | Freq: Once | INTRAMUSCULAR | Status: DC | PRN

## 2022-04-29 MED ORDER — VANCOMYCIN HCL IN DEXTROSE 1-5 GM/200ML-% IV SOLN
1000.0000 mg | Freq: Three times a day (TID) | INTRAVENOUS | Status: DC
  Administered 2022-04-30 – 2022-05-01 (×5): 1000 mg via INTRAVENOUS
  Filled 2022-04-29 (×7): qty 200

## 2022-04-29 MED ORDER — OXYCODONE HCL 5 MG PO TABS: 5.0000 mg | ORAL_TABLET | Freq: Once | ORAL | Status: DC | PRN

## 2022-04-29 MED ORDER — ACETAMINOPHEN 10 MG/ML IV SOLN
INTRAVENOUS | Status: DC | PRN
  Administered 2022-04-29: 1000 mg via INTRAVENOUS

## 2022-04-29 MED ORDER — LACTATED RINGERS IV SOLN: INTRAVENOUS | Status: DC | PRN

## 2022-04-29 MED ORDER — DEXMEDETOMIDINE (PRECEDEX) IN NS 20 MCG/5ML (4 MCG/ML) IV SYRINGE
PREFILLED_SYRINGE | INTRAVENOUS | Status: AC
  Filled 2022-04-29: qty 5

## 2022-04-29 MED ORDER — SODIUM CHLORIDE 0.9 % IV SOLN
2.0000 g | Freq: Three times a day (TID) | INTRAVENOUS | Status: DC
  Administered 2022-04-29 – 2022-05-01 (×7): 2 g via INTRAVENOUS
  Filled 2022-04-29 (×7): qty 12.5

## 2022-04-29 MED ORDER — MIDAZOLAM HCL 2 MG/2ML IJ SOLN
INTRAMUSCULAR | Status: AC
  Filled 2022-04-29: qty 2

## 2022-04-29 MED ORDER — HYDROMORPHONE HCL 1 MG/ML IJ SOLN
0.2500 mg | INTRAMUSCULAR | Status: DC | PRN
  Administered 2022-04-29 (×3): 0.5 mg via INTRAVENOUS

## 2022-04-29 MED ORDER — LIDOCAINE 2% (20 MG/ML) 5 ML SYRINGE
INTRAMUSCULAR | Status: AC
  Filled 2022-04-29: qty 5

## 2022-04-29 MED ORDER — SUGAMMADEX SODIUM 200 MG/2ML IV SOLN
INTRAVENOUS | Status: DC | PRN
  Administered 2022-04-29: 200 mg via INTRAVENOUS

## 2022-04-29 MED ORDER — FENTANYL CITRATE (PF) 250 MCG/5ML IJ SOLN
INTRAMUSCULAR | Status: DC | PRN
  Administered 2022-04-29 (×3): 50 ug via INTRAVENOUS
  Administered 2022-04-29: 100 ug via INTRAVENOUS

## 2022-04-29 MED ORDER — ROCURONIUM BROMIDE 10 MG/ML (PF) SYRINGE
PREFILLED_SYRINGE | INTRAVENOUS | Status: DC | PRN
  Administered 2022-04-29: 70 mg via INTRAVENOUS
  Administered 2022-04-29 (×2): 30 mg via INTRAVENOUS

## 2022-04-29 MED ORDER — MIDAZOLAM HCL 2 MG/2ML IJ SOLN
INTRAMUSCULAR | Status: DC | PRN
  Administered 2022-04-29: 2 mg via INTRAVENOUS

## 2022-04-29 MED ORDER — ONDANSETRON HCL 4 MG/2ML IJ SOLN
INTRAMUSCULAR | Status: DC | PRN
  Administered 2022-04-29: 4 mg via INTRAVENOUS

## 2022-04-29 SURGICAL SUPPLY — 37 items
ADH SKN CLS APL DERMABOND .7 (GAUZE/BANDAGES/DRESSINGS) ×2
BAG COUNTER SPONGE SURGICOUNT (BAG) ×2 IMPLANT
BAG SPNG CNTER NS LX DISP (BAG) ×1
BLADE SURG 11 STRL SS (BLADE) ×3 IMPLANT
CANISTER SUCT 3000ML PPV (MISCELLANEOUS) ×2 IMPLANT
DERMABOND ADVANCED (GAUZE/BANDAGES/DRESSINGS) ×2
DERMABOND ADVANCED .7 DNX12 (GAUZE/BANDAGES/DRESSINGS) ×1 IMPLANT
DRAPE HALF SHEET 40X57 (DRAPES) ×2 IMPLANT
DRAPE INCISE IOBAN 66X45 STRL (DRAPES) ×2 IMPLANT
DRAPE ORTHO SPLIT 77X108 STRL (DRAPES) ×4
DRAPE SURG ORHT 6 SPLT 77X108 (DRAPES) ×2 IMPLANT
DURAPREP 26ML APPLICATOR (WOUND CARE) ×4 IMPLANT
ELECT REM PT RETURN 9FT ADLT (ELECTROSURGICAL) ×2
ELECTRODE REM PT RTRN 9FT ADLT (ELECTROSURGICAL) ×1 IMPLANT
GLOVE ECLIPSE 7.5 STRL STRAW (GLOVE) ×4 IMPLANT
GLOVE INDICATOR 7.5 STRL GRN (GLOVE) ×3 IMPLANT
GOWN STRL REUS W/ TWL LRG LVL3 (GOWN DISPOSABLE) ×2 IMPLANT
GOWN STRL REUS W/ TWL XL LVL3 (GOWN DISPOSABLE) IMPLANT
GOWN STRL REUS W/TWL 2XL LVL3 (GOWN DISPOSABLE) IMPLANT
GOWN STRL REUS W/TWL LRG LVL3 (GOWN DISPOSABLE) ×4
GOWN STRL REUS W/TWL XL LVL3 (GOWN DISPOSABLE) ×4
KIT BASIN OR (CUSTOM PROCEDURE TRAY) ×2 IMPLANT
KIT TURNOVER KIT B (KITS) ×2 IMPLANT
MARKER SKIN DUAL TIP RULER LAB (MISCELLANEOUS) ×1 IMPLANT
NEEDLE HYPO 22GX1.5 SAFETY (NEEDLE) ×2 IMPLANT
NS IRRIG 1000ML POUR BTL (IV SOLUTION) ×2 IMPLANT
PACK LAMINECTOMY NEURO (CUSTOM PROCEDURE TRAY) ×2 IMPLANT
SHEATH PERITONEAL INTRO 61 (SHEATH) ×2 IMPLANT
SHUNT LUMBOPERITONEAL 84X14 (Shunt) ×1 IMPLANT
STAPLER SKIN PROX WIDE 3.9 (STAPLE) ×2 IMPLANT
SUT MNCRL AB 3-0 PS2 27 (SUTURE) ×1 IMPLANT
SUT SILK 3 0 SH 30 (SUTURE) ×1 IMPLANT
SUT VIC AB 2-0 CP2 18 (SUTURE) ×2 IMPLANT
TOWEL GREEN STERILE (TOWEL DISPOSABLE) ×4 IMPLANT
TOWEL GREEN STERILE FF (TOWEL DISPOSABLE) ×2 IMPLANT
UNDERPAD 30X36 HEAVY ABSORB (UNDERPADS AND DIAPERS) ×2 IMPLANT
WATER STERILE IRR 1000ML POUR (IV SOLUTION) ×2 IMPLANT

## 2022-04-29 NOTE — Consult Note (Signed)
Date of Admission:  04/27/2022          Reason for Consult: Rule out bacterial meningitis   Referring Provider: Eric [place] MD   Assessment:  CSF pleocytosis which could be from recent surgery versus infection History of Arnold-Chiari malformation status post occipital decompression on Apr 03, 2022  Plan:  Start vancomycin and cefepime Follow-up culture data if cultures remain sterile and final would get rid of antibiotics  Principal Problem:   Headache Active Problems:   Chiari I malformation (HCC)   Scheduled Meds:  traZODone  50 mg Oral QHS   zonisamide  100 mg Oral BID   Continuous Infusions:  sodium chloride 75 mL/hr at 04/29/22 1111   ceFEPime (MAXIPIME) IV 2 g (04/29/22 1012)   vancomycin 2,250 mg (04/29/22 1124)   vancomycin     PRN Meds:.acetaminophen **OR** acetaminophen, oxyCODONE **OR** HYDROmorphone (DILAUDID) injection, ondansetron **OR** ondansetron (ZOFRAN) IV  HPI: Sabrina Diaz is a 26 y.o. female with history of Arnold-Chiari malformation with recurrent headaches who underwent posterior occipital decompression on Apr 03, 2022.  She had been doing well postoperatively on Medrol Dosepak due to some headaches.  As the Medrol dose pack was completed her headaches worsened abruptly.  She has not had fevers or chills but has had some vomiting and some eye pain.  She came to the ER for evaluation.  ER CT of the head and cervical spine showed the postop suboccipital craniectomy site there is a suboccipital fluid collection that measured 39 x 50 mm that it increased in size versus prior CT from Apr 05, 2022.  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  Attempted to perform lumbar puncture at the bedside but was unable to complete 1.  Ultimately lumbar puncture was accomplished by radiology.  CSF fluid was clear and 10 mL was obtained opening pressure was 29 cm of water and closing pressure was 20 cm of water.  CSF showed  a white blood cell count of 35 white blood cells with 46% segmented neutrophils 19% lymphocytes.  Glucose was 65 and protein 81.  CSF Gram stain was negative and cultures so far off antibiotics have been without growth.  It is my understanding that the patient will undergo ventriculoperitoneal shunt later today.  I am skeptical that she has active infection but I think it is worth covering with antibiotics while we await culture data and I am starting vancomycin and cefepime at present  I spent 83 minutes with the patient including than 50% of the time in face to face counseling of the patient husband regarding the work-up of her CSF pleocytosis and her history of Arnold-Chiari malformation personally reviewing of the head and neck updated culture data along with review of medical records in preparation for the visit and during the visit and in coordination of her care.  Review of Systems: Review of Systems  Constitutional:  Negative for chills, fever, malaise/fatigue and weight loss.  HENT:  Negative for congestion and sore throat.   Eyes:  Negative for blurred vision and photophobia.  Respiratory:  Negative for cough, shortness of breath and wheezing.   Cardiovascular:  Negative for chest pain, palpitations and leg swelling.  Gastrointestinal:  Negative for abdominal pain, blood in stool, constipation, diarrhea, heartburn, melena, nausea and vomiting.  Genitourinary:  Negative for dysuria, flank pain and hematuria.  Musculoskeletal:  Negative for back pain, falls, joint pain and myalgias.  Skin:  Negative for itching and  rash.  Neurological:  Positive for headaches. Negative for dizziness, focal weakness, loss of consciousness and weakness.  Endo/Heme/Allergies:  Does not bruise/bleed easily.  Psychiatric/Behavioral:  Negative for depression and suicidal ideas. The patient does not have insomnia.    Past Medical History:  Diagnosis Date   Chiari malformation type I ([place])    Chronic  headaches     Social History   Tobacco Use   Smoking status: Former    Types: Cigarettes    Quit date: 07/2021    Years since quitting: 0.8   Smokeless tobacco: Never  Vaping Use   Vaping Use: Every day  Substance Use Topics   Alcohol use: Yes    Comment: socially   Drug use: Not Currently    Types: Marijuana    History reviewed. No pertinent family history. No Known Allergies  OBJECTIVE: Blood pressure 127/84, pulse 69, temperature 98.2 F (36.8 C), temperature source Oral, resp. rate 18, height 5\' 6"  (1.676 m), weight 94.5 kg, SpO2 95 %.  Physical Exam Constitutional:      General: She is not in acute distress.    Appearance: Normal appearance. She is well-developed. She is not ill-appearing or diaphoretic.  HENT:     Head: Normocephalic and atraumatic.     Right Ear: Hearing and external ear normal.     Left Ear: Hearing and external ear normal.     Nose: No nasal deformity or rhinorrhea.  Eyes:     General: No scleral icterus.    Conjunctiva/sclera: Conjunctivae normal.     Right eye: Right conjunctiva is not injected.     Left eye: Left conjunctiva is not injected.     Pupils: Pupils are equal, round, and reactive to light.  Neck:     Vascular: No JVD.  Cardiovascular:     Rate and Rhythm: Normal rate and regular rhythm.     Heart sounds: Normal heart sounds, S1 normal and S2 normal. No murmur heard.   No friction rub. No gallop.  Pulmonary:     Effort: Pulmonary effort is normal. No respiratory distress.     Breath sounds: Normal breath sounds. No stridor. No wheezing.  Abdominal:     General: Bowel sounds are normal. There is no distension.     Palpations: Abdomen is soft.     Tenderness: There is no abdominal tenderness.  Musculoskeletal:        General: Normal range of motion.     Right shoulder: Normal.     Left shoulder: Normal.     Cervical back: Normal range of motion and neck supple.     Right hip: Normal.     Left hip: Normal.     Right  knee: Normal.     Left knee: Normal.  Lymphadenopathy:     Head:     Right side of head: No submandibular, preauricular or posterior auricular adenopathy.     Left side of head: No submandibular, preauricular or posterior auricular adenopathy.     Cervical: No cervical adenopathy.     Right cervical: No superficial or deep cervical adenopathy.    Left cervical: No superficial or deep cervical adenopathy.  Skin:    General: Skin is warm and dry.     Coloration: Skin is not pale.     Findings: No abrasion, bruising, ecchymosis, erythema, lesion or rash.     Nails: There is no clubbing.  Neurological:     General: No focal deficit present.  Mental Status: She is alert. Mental status is at baseline. She is disoriented.     Sensory: No sensory deficit.     Coordination: Coordination normal.     Gait: Gait normal.  Psychiatric:        Attention and Perception: She is attentive.        Mood and Affect: Mood normal.        Speech: Speech normal.        Behavior: Behavior normal. Behavior is cooperative.        Thought Content: Thought content normal.        Judgment: Judgment normal.   Surgical scar is well-healed Apr 29, 2022:     Lab Results Lab Results  Component Value Date   WBC 16.4 (H) 04/27/2022   HGB 13.3 04/27/2022   HCT 40.3 04/27/2022   MCV 88.8 04/27/2022   PLT 356 04/27/2022    Lab Results  Component Value Date   CREATININE 0.75 04/29/2022   BUN 10 04/29/2022   NA 136 04/29/2022   K 3.7 04/29/2022   CL 107 04/29/2022   CO2 22 04/29/2022   No results found for: ALT, AST, GGT, ALKPHOS, BILITOT   Microbiology: Recent Results (from the past 240 hour(s))  Blood culture (routine x 2)     Status: None (Preliminary result)   Collection Time: 04/28/22 12:45 AM   Specimen: BLOOD RIGHT HAND  Result Value Ref Range Status   Specimen Description BLOOD RIGHT HAND  Final   Special Requests   Final    BOTTLES DRAWN AEROBIC AND ANAEROBIC Blood Culture adequate  volume   Culture   Final    NO GROWTH 1 DAY Performed at [facility], [place] [address] [place], [place] [address]    Report Status PENDING  Incomplete  Blood culture (routine x 2)     Status: None (Preliminary result)   Collection Time: 04/28/22 12:45 AM   Specimen: BLOOD LEFT HAND  Result Value Ref Range Status   Specimen Description BLOOD LEFT HAND  Final   Special Requests   Final    BOTTLES DRAWN AEROBIC AND ANAEROBIC Blood Culture adequate volume   Culture   Final    NO GROWTH 1 DAY Performed at [facility], [place] [address] [place], [place] [address]    Report Status PENDING  Incomplete  CSF culture w Gram Stain     Status: None (Preliminary result)   Collection Time: 04/28/22  1:57 PM   Specimen: CSF; Cerebrospinal Fluid  Result Value Ref Range Status   Specimen Description CSF  Final   Special Requests Normal  Final   Gram Stain   Final    WBC PRESENT,BOTH PMN AND MONONUCLEAR NO ORGANISMS SEEN CYTOSPIN SMEAR    Culture   Final    NO GROWTH < 24 HOURS Performed at [facility], [place] [address] [place], [place] [address]    Report Status PENDING  Incomplete    Alcide Evener, [facility] [phone] pager  04/29/2022, 11:59 AM

## 2022-04-29 NOTE — Transfer of Care (Signed)
Immediate Anesthesia Transfer of Care Note  Patient: Sabrina Diaz  Procedure(s) Performed: LUMBOPERITONEAL SHUNT PLACEMENT (Spine Lumbar)  Patient Location: PACU  Anesthesia Type:General  Level of Consciousness: drowsy and responds to stimulation  Airway & Oxygen Therapy: Patient Spontanous Breathing and Patient connected to face mask oxygen  Post-op Assessment: Report given to RN and Post -op Vital signs reviewed and stable  Post vital signs: Reviewed and stable  Last Vitals:  Vitals Value Taken Time  BP 124/87 04/29/22 1908  Temp    Pulse 106 04/29/22 1910  Resp 16 04/29/22 1910  SpO2 95 % 04/29/22 1910  Vitals shown include unvalidated device data.  Last Pain:  Vitals:   04/29/22 1453  TempSrc:   PainSc: 5       Patients Stated Pain Goal: 0 (04/29/22 1453)  Complications: No notable events documented.

## 2022-04-29 NOTE — Progress Notes (Signed)
PROGRESS NOTE    Sabrina Diaz  TIW:[id number] DOB: 1996/04/11 DOA: 04/27/2022 PCP: Ardeen Garland, PA-C    Brief Narrative:   Sabrina Diaz is a 26 y.o. female with past medical history significant for Arnold-Chiari malformation s/p occipital decompression on 04/03/2022 by Dr. Zada Finders who presented to [facility] ED on 5/25 with recurrent headaches.  Patient reports she was doing well postoperatively and finished her Medrol Dosepak roughly 2 days prior.  Since her headaches have progressively worsened since stopping the steroids and running out of her opiate medications.  Denies fever/chills.  Also reports episode of vomiting.  In the ED, temperature 97.9 F, HR 62, RR 16, BP 128/88, SPO2 98% on room air.  WBC 16.4, hemoglobin 13.3, platelets 356.  Sodium 138, potassium 3.7, chloride 107, CO2 23, glucose 112, BUN 9, creatinine 0.73.  CRP 1.2.  ESR 9.  Procalcitonin less than 0.10.  hCG negative.  CT head/C-spine without contrast with postoperative suboccipital craniectomy for Charlamb decompression, enlarging suboccipital fluid collection which may be due to CSF leak, interval development low-density subdural fluid collections left frontal region, right tentorium and bilateral posterior fossa likely due to intracranial hypotension, no acute infarct or hemorrhage no acute abnormality in the C-spine.  Patient received IV magnesium, IV Toradol, IV Robaxin and IV Compazine by EDP.  Neurosurgery was consulted.  Hospitalist consulted for admission for headache with concern of CSF leak in the setting of recent occipital decompression for Arnold-Chiari malformation.  Assessment & Plan:   Headache likely secondary to hydrocephalus Patient presenting to ED with progressive headache since stopping her postoperative steroids and opiates.  Recently underwent occipital decompression for Arnold-Chiari malformation. CT head/C-spine without contrast with postoperative suboccipital craniectomy for Charlamb  decompression, enlarging suboccipital fluid collection which may be due to CSF leak, interval development low-density subdural fluid collections left frontal region, right tentorium and bilateral posterior fossa likely due to intracranial hypotension, no acute infarct or hemorrhage no acute abnormality in the C-spine.  Underwent IR lumbar puncture 5/26.  Initial CSF results showing elevated WBC count of 383 with elevated total protein.  ID was consulted, discussed with Dr. Candiss Norse who recommended Vanc/cefepime; but neurosurgery, Dr. Zada Finders believes this is likely accountable to postoperative inflammation and wants to hold off on antibiotics for now. --Neurosurgery following, appreciate assistance --NS 75 mL/h --Zonisamide 200 mg BID --Zofran as needed nausea/vomiting --Oxycodone 5 mg every 4 hours.  Moderate pain --Dilaudid 0.5 mg IV every 2 hours as needed severe pain --Blood cultures x2: No growth x1 day --CSF Gram stain with no organisms present; further culture pending --Pending lumboperitoneal shunt placement by neurosurgery today  Arnold-Chiari malformation Underwent occipital decompression on 04/03/2022 by Dr. Zada Finders  Obesity Body mass index is 33.63 kg/m.  Discussed with patient needs for aggressive lifestyle changes/weight loss as this complicates all facets of care.  Outpatient follow-up with PCP.     DVT prophylaxis: SCD's Start: 04/28/22 1941 SCDs Start: 04/28/22 0152    Code Status: Full Code Family Communication: Husband present at bedside  Disposition Plan:  Level of care: Med-Surg Status is: Inpatient Remains inpatient appropriate because: Pending lumboperitoneal shunt placement by neurosurgery today, awaiting further recommendations for neurosurgery prior to able discharge home     Consultants:  Neurosurgery Infectious disease, discussed with Dr. Candiss Norse 5/26  Procedures:  None  Antimicrobials:  None   Subjective: Patient seen examined bedside, resting  comfortably.  Present.  Initially felt better after lumbar puncture yesterday, but now headache and eye discomfort has now  returned.  Discussed that patient will be having shunt placement by neurosurgery later today; and that we are still watching her cultures to make sure nothing grows out of her CSF.  No other specific complaints or concerns at this time.  Denies fever/chills/night sweats, no vomiting/diarrhea, no chest pain, no palpitations, no shortness of breath, no abdominal pain, no focal weakness, no fatigue, no paresthesias.  No acute events overnight per nursing staff.   Objective: Vitals:   04/28/22 1906 04/28/22 2312 04/29/22 0303 04/29/22 0500  BP: 120/76 124/74 124/70   Pulse: (!) 107 (!) 104 78   Resp: 20     Temp: 98 F (36.7 C)     TempSrc: Oral     SpO2:  97% 92%   Weight:    94.5 kg  Height:        Intake/Output Summary (Last 24 hours) at 04/29/2022 1041 Last data filed at 04/29/2022 1001 Gross per 24 hour  Intake 2140.91 ml  Output --  Net 2140.91 ml   Filed Weights   04/27/22 1808 04/28/22 0500 04/29/22 0500  Weight: 93.4 kg 95.6 kg 94.5 kg    Examination:  Physical Exam: GEN: NAD, alert and oriented x 3, obese HEENT: NCAT, PERRL, EOMI, sclera clear, MMM PULM: CTAB w/o wheezes/crackles, normal respiratory effort, on room air CV: RRR w/o M/G/R GI: abd soft, NTND, NABS, no R/G/M MSK: no peripheral edema, muscle strength globally intact 5/5 bilateral upper/lower extremities NEURO: CN II-XII intact, no focal deficits, sensation to light touch intact PSYCH: normal mood/affect Integumentary: Posterior neck surgical incision site noted, no concerning erythema/fluctuance, no other concerning rashes/lesions/wounds     Data Reviewed: I have personally reviewed following labs and imaging studies  CBC: Recent Labs  Lab 04/27/22 1830  WBC 16.4*  NEUTROABS 11.6*  HGB 13.3  HCT 40.3  MCV 88.8  PLT 681   Basic Metabolic Panel: Recent Labs  Lab  04/27/22 1830  NA 138  K 3.7  CL 107  CO2 23  GLUCOSE 112*  BUN 9  CREATININE 0.73  CALCIUM 9.0   GFR: Estimated Creatinine Clearance: 124.6 mL/min (by C-G formula based on SCr of 0.73 mg/dL). Liver Function Tests: No results for input(s): AST, ALT, ALKPHOS, BILITOT, PROT, ALBUMIN in the last 168 hours. No results for input(s): LIPASE, AMYLASE in the last 168 hours. No results for input(s): AMMONIA in the last 168 hours. Coagulation Profile: No results for input(s): INR, PROTIME in the last 168 hours. Cardiac Enzymes: No results for input(s): CKTOTAL, CKMB, CKMBINDEX, TROPONINI in the last 168 hours. BNP (last 3 results) No results for input(s): PROBNP in the last 8760 hours. HbA1C: No results for input(s): HGBA1C in the last 72 hours. CBG: No results for input(s): GLUCAP in the last 168 hours. Lipid Profile: No results for input(s): CHOL, HDL, LDLCALC, TRIG, CHOLHDL, LDLDIRECT in the last 72 hours. Thyroid Function Tests: No results for input(s): TSH, T4TOTAL, FREET4, T3FREE, THYROIDAB in the last 72 hours. Anemia Panel: No results for input(s): VITAMINB12, FOLATE, FERRITIN, TIBC, IRON, RETICCTPCT in the last 72 hours. Sepsis Labs: Recent Labs  Lab 04/27/22 1830 04/28/22 0559 04/29/22 0314  PROCALCITON <0.10 <0.10 <0.10    Recent Results (from the past 240 hour(s))  Blood culture (routine x 2)     Status: None (Preliminary result)   Collection Time: 04/28/22 12:45 AM   Specimen: BLOOD RIGHT HAND  Result Value Ref Range Status   Specimen Description BLOOD RIGHT HAND  Final   Special Requests  Final    BOTTLES DRAWN AEROBIC AND ANAEROBIC Blood Culture adequate volume   Culture   Final    NO GROWTH 1 DAY Performed at [facility], [place] [address], [place], [place] [address]    Report Status PENDING  Incomplete  Blood culture (routine x 2)     Status: None (Preliminary result)   Collection Time: 04/28/22 12:45 AM   Specimen: BLOOD LEFT HAND  Result Value  Ref Range Status   Specimen Description BLOOD LEFT HAND  Final   Special Requests   Final    BOTTLES DRAWN AEROBIC AND ANAEROBIC Blood Culture adequate volume   Culture   Final    NO GROWTH 1 DAY Performed at [facility], [place] [address] [place], [place] [address]    Report Status PENDING  Incomplete  CSF culture w Gram Stain     Status: None (Preliminary result)   Collection Time: 04/28/22  1:57 PM   Specimen: CSF; Cerebrospinal Fluid  Result Value Ref Range Status   Specimen Description CSF  Final   Special Requests Normal  Final   Gram Stain   Final    WBC PRESENT,BOTH PMN AND MONONUCLEAR NO ORGANISMS SEEN CYTOSPIN SMEAR Performed at [facility], [place] [address] [place], [place] [address]    Culture PENDING  Incomplete   Report Status PENDING  Incomplete         Radiology Studies: CT Head Wo Contrast  Result Date: 04/27/2022 CLINICAL DATA:  Chiari decompression 04/03/2022. Headache. Dizziness EXAM: CT HEAD WITHOUT CONTRAST CT CERVICAL SPINE WITHOUT CONTRAST TECHNIQUE: Multidetector CT imaging of the head and cervical spine was performed following the standard protocol without intravenous contrast. Multiplanar CT image reconstructions of the cervical spine were also generated. RADIATION DOSE REDUCTION: This exam was performed according to the departmental dose-optimization program which includes automated exposure control, adjustment of the mA and/or kV according to patient size and/or use of iterative reconstruction technique. COMPARISON:  CT head 04/05/2022 FINDINGS: CT HEAD FINDINGS Brain: Postop suboccipital craniectomy for Chiari decompression. Suboccipital fluid collection has increased in size now measuring 39 x 50 mm on axial images. Low lying cerebellar tonsils unchanged. Ventricles are small in size, similar to the prior study. Interval development of low-density subdural fluid collection in the left hemisphere anteriorly and along the right tentorium. Small  low-density fluid collection around the cerebellum bilaterally right greater than left. These are new findings since the prior study. No acute hemorrhage. There is crowding of the structures in the posterior fossa which has progressed from the preoperative MRI. This suggests intracranial hypotension from CSF leak. No acute infarct or mass. Vascular: Negative for hyperdense vessel Skull: Suboccipital craniectomy. Sinuses/Orbits: Paranasal sinuses clear. Small effusion right mastoid tip. Left mastoid clear. Negative orbit Other: None CT CERVICAL SPINE FINDINGS Alignment: Normal Skull base and vertebrae: Suboccipital craniectomy. Incomplete posterior arch of C1, probably postsurgical. No cervical spine fracture. Soft tissues and spinal canal: Suboccipital fluid collection measuring 52 x 41 x 59 mm. This extends to the C4 level. Disc levels: Disc space height normal. No disc protrusion or spinal stenosis. Upper chest: Lung apices clear bilaterally. Other: None IMPRESSION: 1. Postop suboccipital craniectomy for Chiari decompression. Enlarging suboccipital fluid collection which may be due to CSF leak. 2. Interval development of low-density subdural fluid collections left frontal region, right tentorium and bilateral posterior fossa, likely due to intracranial hypotension. Basal cisterns are effaced due to intracranial hypotension. 3. No acute infarct or hemorrhage 4. No  acute abnormality in the cervical spine. Electronically Signed   By: Franchot Gallo M.D.   On: 04/27/2022 20:09   CT Cervical Spine Wo Contrast  Result Date: 04/27/2022 CLINICAL DATA:  Chiari decompression 04/03/2022. Headache. Dizziness EXAM: CT HEAD WITHOUT CONTRAST CT CERVICAL SPINE WITHOUT CONTRAST TECHNIQUE: Multidetector CT imaging of the head and cervical spine was performed following the standard protocol without intravenous contrast. Multiplanar CT image reconstructions of the cervical spine were also generated. RADIATION DOSE REDUCTION: This  exam was performed according to the departmental dose-optimization program which includes automated exposure control, adjustment of the mA and/or kV according to patient size and/or use of iterative reconstruction technique. COMPARISON:  CT head 04/05/2022 FINDINGS: CT HEAD FINDINGS Brain: Postop suboccipital craniectomy for Chiari decompression. Suboccipital fluid collection has increased in size now measuring 39 x 50 mm on axial images. Low lying cerebellar tonsils unchanged. Ventricles are small in size, similar to the prior study. Interval development of low-density subdural fluid collection in the left hemisphere anteriorly and along the right tentorium. Small low-density fluid collection around the cerebellum bilaterally right greater than left. These are new findings since the prior study. No acute hemorrhage. There is crowding of the structures in the posterior fossa which has progressed from the preoperative MRI. This suggests intracranial hypotension from CSF leak. No acute infarct or mass. Vascular: Negative for hyperdense vessel Skull: Suboccipital craniectomy. Sinuses/Orbits: Paranasal sinuses clear. Small effusion right mastoid tip. Left mastoid clear. Negative orbit Other: None CT CERVICAL SPINE FINDINGS Alignment: Normal Skull base and vertebrae: Suboccipital craniectomy. Incomplete posterior arch of C1, probably postsurgical. No cervical spine fracture. Soft tissues and spinal canal: Suboccipital fluid collection measuring 52 x 41 x 59 mm. This extends to the C4 level. Disc levels: Disc space height normal. No disc protrusion or spinal stenosis. Upper chest: Lung apices clear bilaterally. Other: None IMPRESSION: 1. Postop suboccipital craniectomy for Chiari decompression. Enlarging suboccipital fluid collection which may be due to CSF leak. 2. Interval development of low-density subdural fluid collections left frontal region, right tentorium and bilateral posterior fossa, likely due to intracranial  hypotension. Basal cisterns are effaced due to intracranial hypotension. 3. No acute infarct or hemorrhage 4. No acute abnormality in the cervical spine. Electronically Signed   By: Franchot Gallo M.D.   On: 04/27/2022 20:09   DG FL GUIDED LUMBAR PUNCTURE  Result Date: 04/28/2022 CLINICAL DATA:  Recent history of Chiari decompression. Pseudomeningocele, headaches. Request for diagnostic lumbar puncture. EXAM: DIAGNOSTIC LUMBAR PUNCTURE UNDER FLUOROSCOPIC GUIDANCE COMPARISON:  None Available. FLUOROSCOPY: Radiation Exposure Index (as provided by the fluoroscopic device): 1.50 mGy Kerma PROCEDURE: Informed consent was obtained from the patient prior to the procedure, including potential complications of headache, allergy, and pain. With the patient prone, the lower back was prepped with Betadine. 1% Lidocaine was used for local anesthesia. Lumbar puncture was performed at the L2-L3 level using a 20 gauge needle with return of clear CSF with an opening pressure of 29 cm water. Approximately 10 ml of CSF were obtained for laboratory studies. Closing pressure measured at 20 cm water. The patient tolerated the procedure well and there were no apparent complications. IMPRESSION: Successful image guided lumbar puncture at L2-L3 level as described above. Performed by: Ascencion Dike PA-C, supervised by Dr. Macy Mis Electronically Signed   By: Macy Mis M.D.   On: 04/28/2022 14:43        Scheduled Meds:  traZODone  50 mg Oral QHS   zonisamide  100 mg Oral BID  Continuous Infusions:  ceFEPime (MAXIPIME) IV 2 g (04/29/22 1012)   vancomycin     vancomycin       LOS: 1 day    Time spent: 48 minutes spent on chart review, discussion with nursing staff, consultants, updating family and interview/physical exam; more than 50% of that time was spent in counseling and/or coordination of care.    Rodrick Payson J [place] DO [facility] Available via Epic secure chat 7am-7pm After these hours, please  refer to coverage provider listed on [email or web address] 04/29/2022, 10:41 AM

## 2022-04-29 NOTE — Anesthesia Preprocedure Evaluation (Signed)
Anesthesia Evaluation  Patient identified by MRN, date of birth, ID band Patient awake  General Assessment Comment:Sabrina Diaz is a 26 y.o. female with past medical history significant for Arnold-Chiari malformation s/p occipital decompression on 04/03/2022 by Dr. Maurice Small who presented to [facility] ED on 5/25 with recurrent headaches.  Patient reports she was doing well postoperatively and finished her Medrol Dosepak roughly 2 days prior.  Since her headaches have progressively worsened since stopping the steroids and running out of her opiate medications  Reviewed: Allergy & Precautions, NPO status , Patient's Chart, lab work & pertinent test results  Airway Mallampati: II  TM Distance: >3 FB Neck ROM: Full    Dental no notable dental hx.    Pulmonary neg pulmonary ROS, former smoker,    Pulmonary exam normal breath sounds clear to auscultation       Cardiovascular negative cardio ROS Normal cardiovascular exam Rhythm:Regular Rate:Normal     Neuro/Psych negative neurological ROS  negative psych ROS   GI/Hepatic negative GI ROS, Neg liver ROS,   Endo/Other  negative endocrine ROS  Renal/GU negative Renal ROS  negative genitourinary   Musculoskeletal negative musculoskeletal ROS (+)   Abdominal   Peds negative pediatric ROS (+)  Hematology negative hematology ROS (+)   Anesthesia Other Findings   Reproductive/Obstetrics negative OB ROS                             Anesthesia Physical Anesthesia Plan  ASA: 2  Anesthesia Plan: General   Post-op Pain Management: Dilaudid IV   Induction: Intravenous  PONV Risk Score and Plan: 3 and Ondansetron, Dexamethasone, Midazolam, Treatment may vary due to age or medical condition and Droperidol  Airway Management Planned: Oral ETT  Additional Equipment:   Intra-op Plan:   Post-operative Plan: Extubation in OR  Informed Consent: I have reviewed the  patients History and Physical, chart, labs and discussed the procedure including the risks, benefits and alternatives for the proposed anesthesia with the patient or authorized representative who has indicated his/her understanding and acceptance.     Dental advisory given  Plan Discussed with: CRNA and Surgeon  Anesthesia Plan Comments:         Anesthesia Quick Evaluation

## 2022-04-29 NOTE — Op Note (Signed)
PATIENT: Sabrina Diaz  DAY OF SURGERY: 04/29/22   PRE-OPERATIVE DIAGNOSIS: Hydrocephalus   POST-OPERATIVE DIAGNOSIS:  Same   PROCEDURE:  Lumboperitoneal shunt placement    SURGEON:  Surgeon(s) and Role:    Jadene Pierini, MD - Primary surgeon   ANESTHESIA: ETGA   BRIEF HISTORY: This is a 26yo woman in whom I previously performed a chiari decompression. She presented with severe headaches, nausea, vomiting, and a tense pseudomeningocele. LP showed increased pressure without evidence of infection. The LP also temporarily resolved her symptoms. I therefore recommended LP shunt placement. This was discussed with the patient as well as risks, benefits, and alternatives and wished to proceed with surgery.   OPERATIVE DETAIL: The patient was taken to the operating room, anesthesia was induced by the anesthesia team, and the patient was placed on the OR table in the lateral decubitus position. A formal time out was performed with two patient identifiers and confirmed the operative site.   The area was then prepped and draped in a sterile fashion from the midline of the lumbar spine to the abdomen across the flank. Fluoroscopy was draped and brought into the field. A small midline incision was placed over L4-5 for insertion of the lumbar catheter. Fluoroscopy was used to guide a Tuohy needle into the thecal sac with return of CSF. The proximal catheter was passed without resistance into the thecal sac, a relief loop was fashioned and secured at the lumbar fascia, and then tunneled to the flank.   On the flank, a a transverse incision was created and a pocket was created for the valve. The proximal catheter was cut to size, confirmed to be functioning, and attached to the valve in the correct orientation.The tunneler was then used to tunnel from the valve site to the abdominal insertion site. I then made a third incision in the left lateral abdomen and created a mini-open laparotomy. The patient's  habitus made this significantly more difficult due to the depth of her abdominal wall. After dissecting the abdominal musculature and entering the pre-peritoneal space, she had a truly impressive amount of pre-peritoneal fat that made further dissection quite difficult. I was able to eventually carefully dissect to the deep side of the preperiteonal space and incise the peritoneum, after which I confirmed continued good flow from the distal catheter and inserted it into the abdomen without resistance. Given the amount of preperitoneal fat, I brought fluoro back into the field and confirmed that the catheter had not coiled into the preperitoneal space - it was layering in the abdomen and pelvis in the usual fashion.  The wound was copiously irrigated, all instrument and sponge counts were correct, and the incisions were then closed in layers. The patient was then returned to anesthesia for emergence. No apparent complications at the completion of the procedure.   EBL:  16mL   IMPLANTS: Medtronic Strata NSC lumboperitoneal shunt, valve set to 2.0   SPECIMENS: none   Jadene Pierini, MD 04/29/22 2:54 PM

## 2022-04-29 NOTE — Anesthesia Postprocedure Evaluation (Signed)
Anesthesia Post Note  Patient: Sabrina Diaz  Procedure(s) Performed: LUMBOPERITONEAL SHUNT PLACEMENT (Spine Lumbar)     Patient location during evaluation: PACU Anesthesia Type: General Level of consciousness: awake Pain management: pain level controlled Vital Signs Assessment: post-procedure vital signs reviewed and stable Respiratory status: spontaneous breathing, nonlabored ventilation, respiratory function stable and patient connected to nasal cannula oxygen Cardiovascular status: blood pressure returned to baseline and stable Postop Assessment: no apparent nausea or vomiting Anesthetic complications: no   No notable events documented.  Last Vitals:  Vitals:   04/29/22 1940 04/29/22 2052  BP: 118/80 126/69  Pulse: 99 84  Resp: 14 16  Temp: 36.7 C 37.1 C  SpO2: 93%     Last Pain:  Vitals:   04/29/22 2111  TempSrc:   PainSc: 7                  Sabrina Diaz

## 2022-04-29 NOTE — Anesthesia Procedure Notes (Signed)
Procedure Name: Intubation Date/Time: 04/29/2022 4:10 PM Performed by: Reece Agar, CRNA Pre-anesthesia Checklist: Patient identified, Emergency Drugs available, Suction available and Patient being monitored Patient Re-evaluated:Patient Re-evaluated prior to induction Oxygen Delivery Method: Circle System Utilized Preoxygenation: Pre-oxygenation with 100% oxygen Induction Type: IV induction Ventilation: Mask ventilation without difficulty Laryngoscope Size: Mac and 3 Grade View: Grade I Tube type: Oral Tube size: 7.0 mm Number of attempts: 1 Airway Equipment and Method: Stylet Placement Confirmation: ETT inserted through vocal cords under direct vision, positive ETCO2 and breath sounds checked- equal and bilateral Secured at: 22 cm Tube secured with: Tape Dental Injury: Teeth and Oropharynx as per pre-operative assessment

## 2022-04-29 NOTE — Progress Notes (Signed)
Neurosurgery Service Progress Note  Subjective: No acute events overnight, headache returned about 12h p LP along with nausea and one episode of emesis, 'feels like crap' again today   Objective: Vitals:   04/29/22 0303 04/29/22 0500 04/29/22 0732 04/29/22 1109  BP: 124/70  131/70 127/84  Pulse: 78  79 69  Resp:   13 18  Temp:   98 F (36.7 C) 98.2 F (36.8 C)  TempSrc:   Oral Oral  SpO2: 92%  97% 95%  Weight:  94.5 kg    Height:        Physical Exam: Aox3, PERRL, EOMI, FS, strength 5/5x4, SILTx4, +pseudomeningocele  Assessment & Plan: 26 y.o. woman s/p chiari decompression with post-op hydrocephalus.   -Cx still NGTD -will take to the OR today w/ Dr. Bedelia Person for LP shunt placement -NPO until OR, post-op can resume regular diet  Jadene Pierini  04/29/22 12:48 PM

## 2022-04-29 NOTE — Progress Notes (Signed)
Pt transported off unit via bed to OR. Report called to short stay. Pt belongings including her rings and jewelry taken off by pt and given to her family at bedside (spouse and parents). Dionne Bucy RN

## 2022-04-29 NOTE — Progress Notes (Addendum)
Pharmacy Antibiotic Note  Sabrina Diaz is a 26 y.o. female admitted on 04/27/2022 with meningitis.  Pharmacy has been consulted for vancomycin/cefepime dosing per ID. SCr 0.73 on presentation (last 5/25).  Plan: Cefepime 2g IV q8h Vancomycin 2250mg  IV x 1; then 1g IV q8h. Goal trough 15-20 Monitor clinical progress, c/s, renal function F/u de-escalation plan/LOT, vancomycin trough at steady state Will recheck SCr today to confirm dosing  ADDENDUM - SCr remains stable today at 0.75. Will continue current vancomycin dosing.  Height: 5\' 6"  (167.6 cm) Weight: 94.5 kg (208 lb 5.4 oz) IBW/kg (Calculated) : 59.3  Temp (24hrs), Avg:98.2 F (36.8 C), Min:98 F (36.7 C), Max:98.5 F (36.9 C)  Recent Labs  Lab 04/27/22 1830  WBC 16.4*  CREATININE 0.73    Estimated Creatinine Clearance: 124.6 mL/min (by C-G formula based on SCr of 0.73 mg/dL).    No Known Allergies   , PharmD, BCPS Please check AMION for all [facility] contact numbers Clinical Pharmacist 04/29/2022 9:55 AM

## 2022-04-30 ENCOUNTER — Inpatient Hospital Stay (HOSPITAL_COMMUNITY): Payer: 59

## 2022-04-30 DIAGNOSIS — G919 Hydrocephalus, unspecified: Secondary | ICD-10-CM

## 2022-04-30 DIAGNOSIS — D729 Disorder of white blood cells, unspecified: Secondary | ICD-10-CM

## 2022-04-30 DIAGNOSIS — G911 Obstructive hydrocephalus: Secondary | ICD-10-CM

## 2022-04-30 MED ORDER — HYDROMORPHONE HCL 1 MG/ML IJ SOLN
0.5000 mg | INTRAMUSCULAR | Status: DC | PRN
  Administered 2022-04-30 – 2022-05-01 (×3): 0.5 mg via INTRAVENOUS
  Filled 2022-04-30 (×3): qty 0.5

## 2022-04-30 MED ORDER — SENNOSIDES-DOCUSATE SODIUM 8.6-50 MG PO TABS
1.0000 | ORAL_TABLET | Freq: Two times a day (BID) | ORAL | Status: DC
  Administered 2022-04-30 – 2022-05-01 (×2): 1 via ORAL
  Filled 2022-04-30 (×2): qty 1

## 2022-04-30 MED ORDER — POLYETHYLENE GLYCOL 3350 17 G PO PACK: 17.0000 g | PACK | Freq: Every day | ORAL | Status: DC | PRN

## 2022-04-30 MED ORDER — OXYCODONE HCL 5 MG PO TABS
5.0000 mg | ORAL_TABLET | ORAL | Status: DC | PRN
  Administered 2022-04-30 – 2022-05-01 (×7): 5 mg via ORAL
  Filled 2022-04-30 (×7): qty 1

## 2022-04-30 NOTE — Progress Notes (Signed)
Subjective: Patient reports doing well overall. She had a moderate headache overnight that was worsened with laying with HOB flat and has since resolved. She has appropriate incisional discomfort, left lateral incision greater that anterior. No acute event overnight.   Objective: Vital signs in last 24 hours: Temp:  [97.4 F (36.3 C)-98.7 F (37.1 C)] 98.4 F (36.9 C) (05/28 0730) Pulse Rate:  [69-106] 91 (05/28 0730) Resp:  [14-18] 15 (05/28 0730) BP: (113-130)/(69-87) 129/75 (05/28 0730) SpO2:  [92 %-97 %] 97 % (05/28 0730) Weight:  [93.4 kg] 93.4 kg (05/27 1440)  Intake/Output from previous day: 05/27 0701 - 05/28 0700 In: 2981.7 [P.O.:200; I.V.:2381.7; IV Piggyback:400] Out: 1370 [Urine:1350; Blood:20] Intake/Output this shift: No intake/output data recorded.  Physical Exam: Patient is awake, A/O X 4, conversant, and in good spirits. Eyes open spontaneously. They are in NAD and VSS. Doing well. Speech is fluent and appropriate. MAEW with good strength that is symmetric bilaterally.  BUE 5/5 throughout, BLE 5/5 throughout. Sensation to light touch is intact. PERLA, EOMI. CNs grossly intact. Posterior cervical incision with no clear evidence of drainage, no erythema / induration. Anterior and left lateral incisions are well approximated with no drainage, erythema, or edema.   Lab Results: Recent Labs    04/27/22 1830  WBC 16.4*  HGB 13.3  HCT 40.3  PLT 356   BMET Recent Labs    04/27/22 1830 04/29/22 1002  NA 138 136  K 3.7 3.7  CL 107 107  CO2 23 22  GLUCOSE 112* 118*  BUN 9 10  CREATININE 0.73 0.75  CALCIUM 9.0 8.7*    Studies/Results: DG Lumbar Spine 2-3 Views  Result Date: 04/29/2022 CLINICAL DATA:  Portable lateral view of the lumbar spine for lumbo peritoneal shunt placement. EXAM: LUMBAR SPINE - 2-3 VIEW Fluoro time: 25 seconds.  Dose: 16.27 mGy.  Views: 2. COMPARISON:  Lumbar MRI, 10/19/2021. FINDINGS: Lateral view shows placement of a catheter or dilator  with its tip projecting posterior to the upper endplate of L4. IMPRESSION: Portable imaging provided for lumbo peritoneal shunt placement. Electronically Signed   By: Amie Portland M.D.   On: 04/29/2022 18:40   DG C-Arm 1-60 Min-No Report  Result Date: 04/29/2022 Fluoroscopy was utilized by the requesting physician.  No radiographic interpretation.   DG C-Arm 1-60 Min-No Report  Result Date: 04/29/2022 Fluoroscopy was utilized by the requesting physician.  No radiographic interpretation.   DG C-Arm 1-60 Min-No Report  Result Date: 04/29/2022 Fluoroscopy was utilized by the requesting physician.  No radiographic interpretation.   DG FL GUIDED LUMBAR PUNCTURE  Result Date: 04/28/2022 CLINICAL DATA:  Recent history of Chiari decompression. Pseudomeningocele, headaches. Request for diagnostic lumbar puncture. EXAM: DIAGNOSTIC LUMBAR PUNCTURE UNDER FLUOROSCOPIC GUIDANCE COMPARISON:  None Available. FLUOROSCOPY: Radiation Exposure Index (as provided by the fluoroscopic device): 1.50 mGy Kerma PROCEDURE: Informed consent was obtained from the patient prior to the procedure, including potential complications of headache, allergy, and pain. With the patient prone, the lower back was prepped with Betadine. 1% Lidocaine was used for local anesthesia. Lumbar puncture was performed at the L2-L3 level using a 20 gauge needle with return of clear CSF with an opening pressure of 29 cm water. Approximately 10 ml of CSF were obtained for laboratory studies. Closing pressure measured at 20 cm water. The patient tolerated the procedure well and there were no apparent complications. IMPRESSION: Successful image guided lumbar puncture at L2-L3 level as described above. Performed by: Brayton El PA-C, supervised by Dr. Elaina Hoops  Allena Katz Electronically Signed   By: Guadlupe Spanish M.D.   On: 04/28/2022 14:43    Assessment/Plan: 26 y.o. female who is POD#1 s/p lumboperitoneal shunt placement. She is recovering well. Her  neurological examination is stable. No complaints of N/V. She had a moderate headache overnight that has since resolved. Posterior cervical incision is well healed without any fluctuance or concerning features. She has appropriate anterior and left lateral incisional discomfort. Doing well.     LOS: 2 days     Council Mechanic, DNP, AGNP-C Neurosurgery Nurse Practitioner  [facility] [address], [place], [place] [address] P: [phone]    F: [phone]  04/30/2022 8:04 AM

## 2022-04-30 NOTE — Progress Notes (Signed)
PROGRESS NOTE    Sabrina Diaz  IZT:[id number] DOB: 1996-01-02 DOA: 04/27/2022 PCP: Ardeen Garland, PA-C    Brief Narrative:   Sabrina Diaz is a 26 y.o. female with past medical history significant for Arnold-Chiari malformation s/p occipital decompression on 04/03/2022 by Dr. Zada Finders who presented to [facility] ED on 5/25 with recurrent headaches.  Patient reports she was doing well postoperatively and finished her Medrol Dosepak roughly 2 days prior.  Since her headaches have progressively worsened since stopping the steroids and running out of her opiate medications.  Denies fever/chills.  Also reports episode of vomiting.  In the ED, temperature 97.9 F, HR 62, RR 16, BP 128/88, SPO2 98% on room air.  WBC 16.4, hemoglobin 13.3, platelets 356.  Sodium 138, potassium 3.7, chloride 107, CO2 23, glucose 112, BUN 9, creatinine 0.73.  CRP 1.2.  ESR 9.  Procalcitonin less than 0.10.  hCG negative.  CT head/C-spine without contrast with postoperative suboccipital craniectomy for Charlamb decompression, enlarging suboccipital fluid collection which may be due to CSF leak, interval development low-density subdural fluid collections left frontal region, right tentorium and bilateral posterior fossa likely due to intracranial hypotension, no acute infarct or hemorrhage no acute abnormality in the C-spine.  Patient received IV magnesium, IV Toradol, IV Robaxin and IV Compazine by EDP.  Neurosurgery was consulted.  Hospitalist consulted for admission for headache with concern of CSF leak in the setting of recent occipital decompression for Arnold-Chiari malformation.  Assessment & Plan:   Hydrocephalus s/p lumboperitoneal shunt Patient presenting to ED with progressive headache since stopping her postoperative steroids and opiates.  Recently underwent occipital decompression for Arnold-Chiari malformation. CT head/C-spine without contrast with postoperative suboccipital craniectomy for Charlamb  decompression, enlarging suboccipital fluid collection which may be due to CSF leak, interval development low-density subdural fluid collections left frontal region, right tentorium and bilateral posterior fossa likely due to intracranial hypotension, no acute infarct or hemorrhage no acute abnormality in the C-spine.  Underwent IR lumbar puncture 5/26.  Initial CSF results showing elevated WBC count of 383 with elevated total protein.  ID was consulted, discussed with Dr. Candiss Norse who recommended Vanc/cefepime; but neurosurgery, Dr. Zada Finders believes this is likely accountable to postoperative inflammation; But ID started Vancomycin and cefepime until unsure no growth in cultures.  Patient underwent lumboperitoneal shunt placement by neurosurgery, Dr. Zada Finders on 04/29/2022. --Neurosurgery following, appreciate assistance --NS 75 mL/h --Zonisamide 200 mg BID --Zofran as needed nausea/vomiting --Oxycodone 5 mg every 4 hours.  Moderate pain --Dilaudid 0.5 mg IV every 3 hours as needed severe pain --Blood cultures x2: No growth x 1 day --CSF Gram stain with no organisms present; culture w/ no growth x 2 days  Arnold-Chiari malformation Underwent occipital decompression on 04/03/2022 by Dr. Zada Finders  Insomnia: Trazodone 50 mg p.o. nightly  Obesity Body mass index is 33.25 kg/m.  Discussed with patient needs for aggressive lifestyle changes/weight loss as this complicates all facets of care.  Outpatient follow-up with PCP.     DVT prophylaxis: SCDs Start: 04/28/22 0152    Code Status: Full Code Family Communication: Husband present at bedside  Disposition Plan:  Level of care: Med-Surg Status is: Inpatient Remains inpatient appropriate because: Continues on IV antibiotics per ID until cultures remain clear     Consultants:  Neurosurgery Infectious disease, discussed with Dr. Candiss Norse 5/26  Procedures:  None  Antimicrobials:  Vancomycin 5/27>> Cefepime 5/27   Subjective: Patient  seen examined bedside, resting comfortably.  Husband present.  Headache and eye  discomfort resolved following shunt placement yesterday.  Continues with some surgical site pain, controlled on medication.  Discussed with patient ID wants to continue antibiotics until culture showed no growth.  No other specific complaints or concerns at this time.  Denies fever/chills/night sweats, no vomiting/diarrhea, no chest pain, no palpitations, no shortness of breath, no abdominal pain, no focal weakness, no fatigue, no paresthesias.  No acute events overnight per nursing staff.   Objective: Vitals:   04/29/22 2052 04/30/22 0005 04/30/22 0307 04/30/22 0730  BP: 126/69 130/76 119/76 129/75  Pulse: 84 98 97 91  Resp: 16   15  Temp: 98.7 F (37.1 C) 98.6 F (37 C) 98.3 F (36.8 C) 98.4 F (36.9 C)  TempSrc: Oral   Oral  SpO2:  93% 93% 97%  Weight:      Height:        Intake/Output Summary (Last 24 hours) at 04/30/2022 1128 Last data filed at 04/30/2022 0321 Gross per 24 hour  Intake 2706.91 ml  Output 1370 ml  Net 1336.91 ml   Filed Weights   04/28/22 0500 04/29/22 0500 04/29/22 1440  Weight: 95.6 kg 94.5 kg 93.4 kg    Examination:  Physical Exam: GEN: NAD, alert and oriented x 3, obese HEENT: NCAT, PERRL, EOMI, sclera clear, MMM PULM: CTAB w/o wheezes/crackles, normal respiratory effort, on room air CV: RRR w/o M/G/R GI: abd soft, NTND, NABS, no R/G/M MSK: no peripheral edema, muscle strength globally intact 5/5 bilateral upper/lower extremities NEURO: CN II-XII intact, no focal deficits, sensation to light touch intact PSYCH: normal mood/affect Integumentary: Posterior neck surgical incision site noted, no concerning erythema/fluctuance, no other concerning rashes/lesions/wounds     Data Reviewed: I have personally reviewed following labs and imaging studies  CBC: Recent Labs  Lab 04/27/22 1830  WBC 16.4*  NEUTROABS 11.6*  HGB 13.3  HCT 40.3  MCV 88.8  PLT 431   Basic  Metabolic Panel: Recent Labs  Lab 04/27/22 1830 04/29/22 1002  NA 138 136  K 3.7 3.7  CL 107 107  CO2 23 22  GLUCOSE 112* 118*  BUN 9 10  CREATININE 0.73 0.75  CALCIUM 9.0 8.7*   GFR: Estimated Creatinine Clearance: 123.7 mL/min (by C-G formula based on SCr of 0.75 mg/dL). Liver Function Tests: No results for input(s): AST, ALT, ALKPHOS, BILITOT, PROT, ALBUMIN in the last 168 hours. No results for input(s): LIPASE, AMYLASE in the last 168 hours. No results for input(s): AMMONIA in the last 168 hours. Coagulation Profile: No results for input(s): INR, PROTIME in the last 168 hours. Cardiac Enzymes: No results for input(s): CKTOTAL, CKMB, CKMBINDEX, TROPONINI in the last 168 hours. BNP (last 3 results) No results for input(s): PROBNP in the last 8760 hours. HbA1C: No results for input(s): HGBA1C in the last 72 hours. CBG: No results for input(s): GLUCAP in the last 168 hours. Lipid Profile: No results for input(s): CHOL, HDL, LDLCALC, TRIG, CHOLHDL, LDLDIRECT in the last 72 hours. Thyroid Function Tests: No results for input(s): TSH, T4TOTAL, FREET4, T3FREE, THYROIDAB in the last 72 hours. Anemia Panel: No results for input(s): VITAMINB12, FOLATE, FERRITIN, TIBC, IRON, RETICCTPCT in the last 72 hours. Sepsis Labs: Recent Labs  Lab 04/27/22 1830 04/28/22 0559 04/29/22 0314  PROCALCITON <0.10 <0.10 <0.10    Recent Results (from the past 240 hour(s))  Blood culture (routine x 2)     Status: None (Preliminary result)   Collection Time: 04/28/22 12:45 AM   Specimen: BLOOD RIGHT HAND  Result Value Ref  Range Status   Specimen Description BLOOD RIGHT HAND  Final   Special Requests   Final    BOTTLES DRAWN AEROBIC AND ANAEROBIC Blood Culture adequate volume   Culture   Final    NO GROWTH 1 DAY Performed at [facility], [address], [place], [place] [address]    Report Status PENDING  Incomplete  Blood culture (routine x 2)     Status: None (Preliminary result)    Collection Time: 04/28/22 12:45 AM   Specimen: BLOOD LEFT HAND  Result Value Ref Range Status   Specimen Description BLOOD LEFT HAND  Final   Special Requests   Final    BOTTLES DRAWN AEROBIC AND ANAEROBIC Blood Culture adequate volume   Culture   Final    NO GROWTH 1 DAY Performed at [facility], [place] [address] [place], [place] [address]    Report Status PENDING  Incomplete  CSF culture w Gram Stain     Status: None (Preliminary result)   Collection Time: 04/28/22  1:57 PM   Specimen: CSF; Cerebrospinal Fluid  Result Value Ref Range Status   Specimen Description CSF  Final   Special Requests Normal  Final   Gram Stain   Final    WBC PRESENT,BOTH PMN AND MONONUCLEAR NO ORGANISMS SEEN CYTOSPIN SMEAR    Culture   Final    NO GROWTH 2 DAYS Performed at [facility], [place] [address] [place], [place] [address]    Report Status PENDING  Incomplete  Surgical pcr screen     Status: None   Collection Time: 04/29/22 11:12 AM   Specimen: Nasal Mucosa; Nasal Swab  Result Value Ref Range Status   MRSA, PCR NEGATIVE NEGATIVE Final   Staphylococcus aureus NEGATIVE NEGATIVE Final    Comment: (NOTE) The Xpert SA Assay (FDA approved for NASAL specimens in patients 38 years of age and older), is one component of a comprehensive surveillance program. It is not intended to diagnose infection nor to guide or monitor treatment. Performed at [facility], [place] [address], [place], [place] [address]          Radiology Studies: DG Lumbar Spine 2-3 Views  Result Date: 04/29/2022 CLINICAL DATA:  Portable lateral view of the lumbar spine for lumbo peritoneal shunt placement. EXAM: LUMBAR SPINE - 2-3 VIEW Fluoro time: 25 seconds.  Dose: 16.27 mGy.  Views: 2. COMPARISON:  Lumbar MRI, 10/19/2021. FINDINGS: Lateral view shows placement of a catheter or dilator with its tip projecting posterior to the upper endplate of L4. IMPRESSION: Portable imaging provided for lumbo  peritoneal shunt placement. Electronically Signed   By: Lajean Manes M.D.   On: 04/29/2022 18:40   DG Abd 2 Views  Result Date: 04/30/2022 CLINICAL DATA:  Postop.  History of Chiari malformation. EXAM: ABDOMEN - 2 VIEW COMPARISON:  None Available. FINDINGS: There is a lumbar shunt catheter which enters the canal at the left L3-4 neuro foramina. The shunt catheter terminates at the approximate T12-L1 level. The distal portion of the shunt catheter terminates within the ventral abdomen at the approximate L3-4 level. Bowel gas pattern appears nonobstructed. Moderate stool burden noted within the colon. IMPRESSION: Anterior lumbar shunt catheter is identified which enters the canal the of the left L3-4 neural foramina. Electronically Signed   By: Kerby Moors M.D.   On: 04/30/2022 10:25   DG C-Arm 1-60 Min-No Report  Result Date: 04/29/2022 Fluoroscopy was utilized by the requesting physician.  No  radiographic interpretation.   DG C-Arm 1-60 Min-No Report  Result Date: 04/29/2022 Fluoroscopy was utilized by the requesting physician.  No radiographic interpretation.   DG C-Arm 1-60 Min-No Report  Result Date: 04/29/2022 Fluoroscopy was utilized by the requesting physician.  No radiographic interpretation.   DG FL GUIDED LUMBAR PUNCTURE  Result Date: 04/28/2022 CLINICAL DATA:  Recent history of Chiari decompression. Pseudomeningocele, headaches. Request for diagnostic lumbar puncture. EXAM: DIAGNOSTIC LUMBAR PUNCTURE UNDER FLUOROSCOPIC GUIDANCE COMPARISON:  None Available. FLUOROSCOPY: Radiation Exposure Index (as provided by the fluoroscopic device): 1.50 mGy Kerma PROCEDURE: Informed consent was obtained from the patient prior to the procedure, including potential complications of headache, allergy, and pain. With the patient prone, the lower back was prepped with Betadine. 1% Lidocaine was used for local anesthesia. Lumbar puncture was performed at the L2-L3 level using a 20 gauge needle with  return of clear CSF with an opening pressure of 29 cm water. Approximately 10 ml of CSF were obtained for laboratory studies. Closing pressure measured at 20 cm water. The patient tolerated the procedure well and there were no apparent complications. IMPRESSION: Successful image guided lumbar puncture at L2-L3 level as described above. Performed by: Ascencion Dike PA-C, supervised by Dr. Macy Mis Electronically Signed   By: Macy Mis M.D.   On: 04/28/2022 14:43        Scheduled Meds:  traZODone  50 mg Oral QHS   zonisamide  100 mg Oral BID   Continuous Infusions:  sodium chloride 75 mL/hr at 04/29/22 2047   ceFEPime (MAXIPIME) IV 2 g (04/30/22 0922)   vancomycin 1,000 mg (04/30/22 1116)     LOS: 2 days    Time spent: 48 minutes spent on chart review, discussion with nursing staff, consultants, updating family and interview/physical exam; more than 50% of that time was spent in counseling and/or coordination of care.    Garreth Burnsworth J [place] DO [facility] Available via Epic secure chat 7am-7pm After these hours, please refer to coverage provider listed on [email or web address] 04/30/2022, 11:28 AM

## 2022-04-30 NOTE — Discharge Summary (Signed)
Physician Discharge Summary  Sabrina Diaz IRC:[id number] DOB: Jun 08, 1996 DOA: 04/27/2022  PCP: Ardeen Garland, PA-C  Admit date: 04/27/2022 Discharge date: 05/01/2022  Admitted From: Home Disposition: Home  Recommendations for Outpatient Follow-up:  Follow up with PCP in 1-2 weeks Follow-up with neurosurgery, Dr. Zada Finders as scheduled Follow-up finalized CSF and blood cultures that were pending at time of discharge  Home Health: No Equipment/Devices: None  Discharge Condition: Stable CODE STATUS: Full code Diet recommendation: Regular diet  History of present illness:  Sabrina Diaz is a 26 y.o. female with past medical history significant for Arnold-Chiari malformation s/p occipital decompression on 04/03/2022 by Dr. Zada Finders who presented to [facility] ED on 5/25 with recurrent headaches.  Patient reports she was doing well postoperatively and finished her Medrol Dosepak roughly 2 days prior.  Since her headaches have progressively worsened since stopping the steroids and running out of her opiate medications.  Denies fever/chills.  Also reports episode of vomiting.   In the ED, temperature 97.9 F, HR 62, RR 16, BP 128/88, SPO2 98% on room air.  WBC 16.4, hemoglobin 13.3, platelets 356.  Sodium 138, potassium 3.7, chloride 107, CO2 23, glucose 112, BUN 9, creatinine 0.73.  CRP 1.2.  ESR 9.  Procalcitonin less than 0.10.  hCG negative.  CT head/C-spine without contrast with postoperative suboccipital craniectomy for Charlamb decompression, enlarging suboccipital fluid collection which may be due to CSF leak, interval development low-density subdural fluid collections left frontal region, right tentorium and bilateral posterior fossa likely due to intracranial hypotension, no acute infarct or hemorrhage no acute abnormality in the C-spine.  Patient received IV magnesium, IV Toradol, IV Robaxin and IV Compazine by EDP.  Neurosurgery was consulted.  Hospitalist consulted for admission for  headache with concern of CSF leak in the setting of recent occipital decompression for Arnold-Chiari malformation.  Hospital course:  Hydrocephalus s/p lumboperitoneal shunt Patient presenting to ED with progressive headache since stopping her postoperative steroids and opiates.  Recently underwent occipital decompression for Arnold-Chiari malformation. CT head/C-spine without contrast with postoperative suboccipital craniectomy for Charlamb decompression, enlarging suboccipital fluid collection which may be due to CSF leak, interval development low-density subdural fluid collections left frontal region, right tentorium and bilateral posterior fossa likely due to intracranial hypotension, no acute infarct or hemorrhage no acute abnormality in the C-spine.  Underwent IR lumbar puncture 5/26.  Initial CSF results showing elevated WBC count of 383 with elevated total protein.  ID was consulted, discussed with Dr. Candiss Norse who recommended Vanc/cefepime; but neurosurgery, Dr. Zada Finders believes this is likely accountable to postoperative inflammation; but infectious disease just as a precaution started patient on IV vancomycin and cefepime.  Patient underwent lumboperitoneal shunt placement by neurosurgery, Dr. Zada Finders on 04/29/2022. CSF cultures remain negative x 3 days and ID okay for discharge home off antibiotics and they will follow her cultures to finalization.     Arnold-Chiari malformation Underwent occipital decompression on 04/03/2022 by Dr. Zada Finders   Insomnia: Trazodone 50 mg p.o. nightly   Obesity Body mass index is 33.25 kg/m.  Discussed with patient needs for aggressive lifestyle changes/weight loss as this complicates all facets of care.  Outpatient follow-up with PCP.    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) Active Problems:   Chiari I malformation (HCC)   Headache   CSF pleocytosis    Discharge  Instructions  Discharge Instructions     Call MD for:  difficulty breathing, headache or visual  disturbances   Complete by: As directed    Call MD for:  extreme fatigue   Complete by: As directed    Call MD for:  persistant dizziness or light-headedness   Complete by: As directed    Call MD for:  persistant nausea and vomiting   Complete by: As directed    Call MD for:  severe uncontrolled pain   Complete by: As directed    Call MD for:  temperature >100.4   Complete by: As directed    Diet - low sodium heart healthy   Complete by: As directed    Increase activity slowly   Complete by: As directed    No wound care   Complete by: As directed       Allergies as of 05/01/2022   No Known Allergies      Medication List     TAKE these medications    acetaminophen 500 MG tablet Commonly known as: TYLENOL Take 1,000 mg by mouth every 6 (six) hours as needed for moderate pain or headache.   ibuprofen 200 MG tablet Commonly known as: ADVIL Take 800 mg by mouth every 6 (six) hours as needed for moderate pain or headache.   methocarbamol 500 MG tablet Commonly known as: ROBAXIN Take 1 tablet (500 mg total) by mouth every 6 (six) hours as needed for up to 14 days for muscle spasms.   oxyCODONE 5 MG immediate release tablet Commonly known as: Oxy IR/ROXICODONE Take 1 tablet (5 mg total) by mouth every 4 (four) hours as needed for up to 7 days for moderate pain.   polyethylene glycol 17 g packet Commonly known as: MIRALAX / GLYCOLAX Take 17 g by mouth daily as needed for up to 14 days for mild constipation.   rizatriptan 10 MG tablet Commonly known as: Maxalt Take 1 tablet (10 mg total) by mouth as needed for migraine. May repeat in 2 hours if needed   senna-docusate 8.6-50 MG tablet Commonly known as: Senokot-S Take 1 tablet by mouth 2 (two) times daily for 14 days.   traZODone 50 MG tablet Commonly known as: DESYREL Take 1 tablet (50 mg total) by mouth at bedtime.    zonisamide 100 MG capsule Commonly known as: ZONEGRAN Take 2 capsules (200 mg total) by mouth daily. Take 100 mg (1 pill) daily for two weeks, then increase to 200 mg (2 pills) daily. What changed:  how much to take when to take this additional instructions        Follow-up Information     Ardeen Garland, PA-C. Schedule an appointment as soon as possible for a visit in 1 week(s).   Specialty: Family Medicine Contact information: [address] [place] [address] [phone]         Judith Part, MD Follow up.   Specialty: Neurosurgery Contact information: [place]  [address] [phone]                No Known Allergies  Consultations: Infectious disease, Dr. Drucilla Schmidt Neurosurgery, Dr. Zada Finders   Procedures/Studies: DG Lumbar Spine 2-3 Views  Result Date: 04/29/2022 CLINICAL DATA:  Portable lateral view of the lumbar spine for lumbo peritoneal shunt placement. EXAM: LUMBAR SPINE - 2-3 VIEW Fluoro time: 25 seconds.  Dose: 16.27 mGy.  Views: 2. COMPARISON:  Lumbar MRI, 10/19/2021. FINDINGS: Lateral view shows placement of a catheter or dilator with its tip projecting posterior to the upper endplate of L4. IMPRESSION: Portable imaging provided for  lumbo peritoneal shunt placement. Electronically Signed   By: Lajean Manes M.D.   On: 04/29/2022 18:40   CT Head Wo Contrast  Result Date: 04/27/2022 CLINICAL DATA:  Chiari decompression 04/03/2022. Headache. Dizziness EXAM: CT HEAD WITHOUT CONTRAST CT CERVICAL SPINE WITHOUT CONTRAST TECHNIQUE: Multidetector CT imaging of the head and cervical spine was performed following the standard protocol without intravenous contrast. Multiplanar CT image reconstructions of the cervical spine were also generated. RADIATION DOSE REDUCTION: This exam was performed according to the departmental dose-optimization program which includes automated exposure control, adjustment of the mA and/or kV  according to patient size and/or use of iterative reconstruction technique. COMPARISON:  CT head 04/05/2022 FINDINGS: CT HEAD FINDINGS Brain: Postop suboccipital craniectomy for Chiari decompression. Suboccipital fluid collection has increased in size now measuring 39 x 50 mm on axial images. Low lying cerebellar tonsils unchanged. Ventricles are small in size, similar to the prior study. Interval development of low-density subdural fluid collection in the left hemisphere anteriorly and along the right tentorium. Small low-density fluid collection around the cerebellum bilaterally right greater than left. These are new findings since the prior study. No acute hemorrhage. There is crowding of the structures in the posterior fossa which has progressed from the preoperative MRI. This suggests intracranial hypotension from CSF leak. No acute infarct or mass. Vascular: Negative for hyperdense vessel Skull: Suboccipital craniectomy. Sinuses/Orbits: Paranasal sinuses clear. Small effusion right mastoid tip. Left mastoid clear. Negative orbit Other: None CT CERVICAL SPINE FINDINGS Alignment: Normal Skull base and vertebrae: Suboccipital craniectomy. Incomplete posterior arch of C1, probably postsurgical. No cervical spine fracture. Soft tissues and spinal canal: Suboccipital fluid collection measuring 52 x 41 x 59 mm. This extends to the C4 level. Disc levels: Disc space height normal. No disc protrusion or spinal stenosis. Upper chest: Lung apices clear bilaterally. Other: None IMPRESSION: 1. Postop suboccipital craniectomy for Chiari decompression. Enlarging suboccipital fluid collection which may be due to CSF leak. 2. Interval development of low-density subdural fluid collections left frontal region, right tentorium and bilateral posterior fossa, likely due to intracranial hypotension. Basal cisterns are effaced due to intracranial hypotension. 3. No acute infarct or hemorrhage 4. No acute abnormality in the cervical  spine. Electronically Signed   By: Franchot Gallo M.D.   On: 04/27/2022 20:09   CT HEAD WO CONTRAST (5MM)  Result Date: 04/05/2022 CLINICAL DATA:  Double vision after Chiari malformation. EXAM: CT HEAD WITHOUT CONTRAST TECHNIQUE: Contiguous axial images were obtained from the base of the skull through the vertex without intravenous contrast. RADIATION DOSE REDUCTION: This exam was performed according to the departmental dose-optimization program which includes automated exposure control, adjustment of the mA and/or kV according to patient size and/or use of iterative reconstruction technique. COMPARISON:  10/12/2021 FINDINGS: Brain: Cerebellar tonsillar ectopia which changes of recent suboccipital decompression with hemostatic material at the bone defect. No complicating hemorrhage or visible infarct. Mild brain sagging suspected when compared to prior MRI. Vascular: Negative Skull: Suboccipital decompression. Sinuses/Orbits: Negative IMPRESSION: 1. No complicating infarct or hemorrhage after suboccipital decompression. 2. Suspect mild brain sagging when compared to preoperative MRI Electronically Signed   By: Jorje Guild M.D.   On: 04/05/2022 09:20   CT Cervical Spine Wo Contrast  Result Date: 04/27/2022 CLINICAL DATA:  Chiari decompression 04/03/2022. Headache. Dizziness EXAM: CT HEAD WITHOUT CONTRAST CT CERVICAL SPINE WITHOUT CONTRAST TECHNIQUE: Multidetector CT imaging of the head and cervical spine was performed following the standard protocol without intravenous contrast. Multiplanar CT image reconstructions of the  cervical spine were also generated. RADIATION DOSE REDUCTION: This exam was performed according to the departmental dose-optimization program which includes automated exposure control, adjustment of the mA and/or kV according to patient size and/or use of iterative reconstruction technique. COMPARISON:  CT head 04/05/2022 FINDINGS: CT HEAD FINDINGS Brain: Postop suboccipital craniectomy  for Chiari decompression. Suboccipital fluid collection has increased in size now measuring 39 x 50 mm on axial images. Low lying cerebellar tonsils unchanged. Ventricles are small in size, similar to the prior study. Interval development of low-density subdural fluid collection in the left hemisphere anteriorly and along the right tentorium. Small low-density fluid collection around the cerebellum bilaterally right greater than left. These are new findings since the prior study. No acute hemorrhage. There is crowding of the structures in the posterior fossa which has progressed from the preoperative MRI. This suggests intracranial hypotension from CSF leak. No acute infarct or mass. Vascular: Negative for hyperdense vessel Skull: Suboccipital craniectomy. Sinuses/Orbits: Paranasal sinuses clear. Small effusion right mastoid tip. Left mastoid clear. Negative orbit Other: None CT CERVICAL SPINE FINDINGS Alignment: Normal Skull base and vertebrae: Suboccipital craniectomy. Incomplete posterior arch of C1, probably postsurgical. No cervical spine fracture. Soft tissues and spinal canal: Suboccipital fluid collection measuring 52 x 41 x 59 mm. This extends to the C4 level. Disc levels: Disc space height normal. No disc protrusion or spinal stenosis. Upper chest: Lung apices clear bilaterally. Other: None IMPRESSION: 1. Postop suboccipital craniectomy for Chiari decompression. Enlarging suboccipital fluid collection which may be due to CSF leak. 2. Interval development of low-density subdural fluid collections left frontal region, right tentorium and bilateral posterior fossa, likely due to intracranial hypotension. Basal cisterns are effaced due to intracranial hypotension. 3. No acute infarct or hemorrhage 4. No acute abnormality in the cervical spine. Electronically Signed   By: Franchot Gallo M.D.   On: 04/27/2022 20:09   DG Abd 2 Views  Result Date: 04/30/2022 CLINICAL DATA:  Postop.  History of Chiari  malformation. EXAM: ABDOMEN - 2 VIEW COMPARISON:  None Available. FINDINGS: There is a lumbar shunt catheter which enters the canal at the left L3-4 neuro foramina. The shunt catheter terminates at the approximate T12-L1 level. The distal portion of the shunt catheter terminates within the ventral abdomen at the approximate L3-4 level. Bowel gas pattern appears nonobstructed. Moderate stool burden noted within the colon. IMPRESSION: Anterior lumbar shunt catheter is identified which enters the canal the of the left L3-4 neural foramina. Electronically Signed   By: Kerby Moors M.D.   On: 04/30/2022 10:25   DG C-Arm 1-60 Min-No Report  Result Date: 04/29/2022 Fluoroscopy was utilized by the requesting physician.  No radiographic interpretation.   DG C-Arm 1-60 Min-No Report  Result Date: 04/29/2022 Fluoroscopy was utilized by the requesting physician.  No radiographic interpretation.   DG C-Arm 1-60 Min-No Report  Result Date: 04/29/2022 Fluoroscopy was utilized by the requesting physician.  No radiographic interpretation.   DG FL GUIDED LUMBAR PUNCTURE  Result Date: 04/28/2022 CLINICAL DATA:  Recent history of Chiari decompression. Pseudomeningocele, headaches. Request for diagnostic lumbar puncture. EXAM: DIAGNOSTIC LUMBAR PUNCTURE UNDER FLUOROSCOPIC GUIDANCE COMPARISON:  None Available. FLUOROSCOPY: Radiation Exposure Index (as provided by the fluoroscopic device): 1.50 mGy Kerma PROCEDURE: Informed consent was obtained from the patient prior to the procedure, including potential complications of headache, allergy, and pain. With the patient prone, the lower back was prepped with Betadine. 1% Lidocaine was used for local anesthesia. Lumbar puncture was performed at the L2-L3 level using  a 20 gauge needle with return of clear CSF with an opening pressure of 29 cm water. Approximately 10 ml of CSF were obtained for laboratory studies. Closing pressure measured at 20 cm water. The patient tolerated  the procedure well and there were no apparent complications. IMPRESSION: Successful image guided lumbar puncture at L2-L3 level as described above. Performed by: Ascencion Dike PA-C, supervised by Dr. Macy Mis Electronically Signed   By: Macy Mis M.D.   On: 04/28/2022 14:43     Subjective:   Discharge Exam: Vitals:   05/01/22 0346 05/01/22 0745  BP: 123/76 122/82  Pulse: (!) 110 (!) 102  Resp:  20  Temp: 98.8 F (37.1 C) 98.6 F (37 C)  SpO2: 95% 97%   Vitals:   04/30/22 2338 05/01/22 0346 05/01/22 0500 05/01/22 0745  BP: 115/74 123/76  122/82  Pulse: 88 (!) 110  (!) 102  Resp:    20  Temp: 98.8 F (37.1 C) 98.8 F (37.1 C)  98.6 F (37 C)  TempSrc:  Oral  Oral  SpO2: 98% 95%  97%  Weight:   94.6 kg   Height:        Physical Exam: GEN: NAD, alert and oriented x 3, obese HEENT: NCAT, PERRL, EOMI, sclera clear, MMM PULM: CTAB w/o wheezes/crackles, normal respiratory effort, on room air CV: RRR w/o M/G/R GI: abd soft, NTND, NABS, no R/G/M MSK: no peripheral edema, muscle strength globally intact 5/5 bilateral upper/lower extremities NEURO: CN II-XII intact, no focal deficits, sensation to light touch intact PSYCH: normal mood/affect Integumentary: Posterior cervical incision site with no erythema/induration or drainage, anterior/left lateral incision sites well approximated with no surrounding erythema/edema or fluctuance, no other concerning rashes/lesions/wounds    The results of significant diagnostics from this hospitalization (including imaging, microbiology, ancillary and laboratory) are listed below for reference.     Microbiology: Recent Results (from the past 240 hour(s))  Blood culture (routine x 2)     Status: None (Preliminary result)   Collection Time: 04/28/22 12:45 AM   Specimen: BLOOD RIGHT HAND  Result Value Ref Range Status   Specimen Description BLOOD RIGHT HAND  Final   Special Requests   Final    BOTTLES DRAWN AEROBIC AND ANAEROBIC  Blood Culture adequate volume   Culture   Final    NO GROWTH 3 DAYS Performed at [facility], [address], [place], [place] [address]    Report Status PENDING  Incomplete  Blood culture (routine x 2)     Status: None (Preliminary result)   Collection Time: 04/28/22 12:45 AM   Specimen: BLOOD LEFT HAND  Result Value Ref Range Status   Specimen Description BLOOD LEFT HAND  Final   Special Requests   Final    BOTTLES DRAWN AEROBIC AND ANAEROBIC Blood Culture adequate volume   Culture   Final    NO GROWTH 3 DAYS Performed at [facility], [place] [address] [place], [place] [address]    Report Status PENDING  Incomplete  CSF culture w Gram Stain     Status: None (Preliminary result)   Collection Time: 04/28/22  1:57 PM   Specimen: CSF; Cerebrospinal Fluid  Result Value Ref Range Status   Specimen Description CSF  Final   Special Requests Normal  Final   Gram Stain   Final    WBC PRESENT,BOTH PMN AND MONONUCLEAR NO ORGANISMS SEEN CYTOSPIN SMEAR    Culture   Final    NO GROWTH 3  DAYS Performed at [facility], [place] [address], [place], [place] [address]    Report Status PENDING  Incomplete  Surgical pcr screen     Status: None   Collection Time: 04/29/22 11:12 AM   Specimen: Nasal Mucosa; Nasal Swab  Result Value Ref Range Status   MRSA, PCR NEGATIVE NEGATIVE Final   Staphylococcus aureus NEGATIVE NEGATIVE Final    Comment: (NOTE) The Xpert SA Assay (FDA approved for NASAL specimens in patients 57 years of age and older), is one component of a comprehensive surveillance program. It is not intended to diagnose infection nor to guide or monitor treatment. Performed at [facility], [place] [address], [place], [place] [address]      Labs: BNP (last 3 results) No results for input(s): BNP in the last 8760 hours. Basic Metabolic Panel: Recent Labs  Lab 04/27/22 1830 04/29/22 1002  NA 138 136  K 3.7 3.7  CL 107 107  CO2 23 22  GLUCOSE 112* 118*   BUN 9 10  CREATININE 0.73 0.75  CALCIUM 9.0 8.7*   Liver Function Tests: No results for input(s): AST, ALT, ALKPHOS, BILITOT, PROT, ALBUMIN in the last 168 hours. No results for input(s): LIPASE, AMYLASE in the last 168 hours. No results for input(s): AMMONIA in the last 168 hours. CBC: Recent Labs  Lab 04/27/22 1830  WBC 16.4*  NEUTROABS 11.6*  HGB 13.3  HCT 40.3  MCV 88.8  PLT 356   Cardiac Enzymes: No results for input(s): CKTOTAL, CKMB, CKMBINDEX, TROPONINI in the last 168 hours. BNP: Invalid input(s): POCBNP CBG: No results for input(s): GLUCAP in the last 168 hours. D-Dimer No results for input(s): DDIMER in the last 72 hours. Hgb A1c No results for input(s): HGBA1C in the last 72 hours. Lipid Profile No results for input(s): CHOL, HDL, LDLCALC, TRIG, CHOLHDL, LDLDIRECT in the last 72 hours. Thyroid function studies No results for input(s): TSH, T4TOTAL, T3FREE, THYROIDAB in the last 72 hours.  Invalid input(s): FREET3 Anemia work up No results for input(s): VITAMINB12, FOLATE, FERRITIN, TIBC, IRON, RETICCTPCT in the last 72 hours. Urinalysis No results found for: COLORURINE, APPEARANCEUR, [place], [place], [place], [place], [place], [place], PROTEINUR, UROBILINOGEN, NITRITE, LEUKOCYTESUR Sepsis Labs Invalid input(s): PROCALCITONIN,  WBC,  LACTICIDVEN Microbiology Recent Results (from the past 240 hour(s))  Blood culture (routine x 2)     Status: None (Preliminary result)   Collection Time: 04/28/22 12:45 AM   Specimen: BLOOD RIGHT HAND  Result Value Ref Range Status   Specimen Description BLOOD RIGHT HAND  Final   Special Requests   Final    BOTTLES DRAWN AEROBIC AND ANAEROBIC Blood Culture adequate volume   Culture   Final    NO GROWTH 3 DAYS Performed at [facility], [place] [address] [place], [place] [address]    Report Status PENDING  Incomplete  Blood culture (routine x 2)     Status: None (Preliminary result)   Collection Time: 04/28/22  12:45 AM   Specimen: BLOOD LEFT HAND  Result Value Ref Range Status   Specimen Description BLOOD LEFT HAND  Final   Special Requests   Final    BOTTLES DRAWN AEROBIC AND ANAEROBIC Blood Culture adequate volume   Culture   Final    NO GROWTH 3 DAYS Performed at [facility], [place] [address] [place], [place] [address]    Report Status PENDING  Incomplete  CSF culture w Gram Stain     Status: None (Preliminary  result)   Collection Time: 04/28/22  1:57 PM   Specimen: CSF; Cerebrospinal Fluid  Result Value Ref Range Status   Specimen Description CSF  Final   Special Requests Normal  Final   Gram Stain   Final    WBC PRESENT,BOTH PMN AND MONONUCLEAR NO ORGANISMS SEEN CYTOSPIN SMEAR    Culture   Final    NO GROWTH 3 DAYS Performed at [facility], [address] [place], [place] [address]    Report Status PENDING  Incomplete  Surgical pcr screen     Status: None   Collection Time: 04/29/22 11:12 AM   Specimen: Nasal Mucosa; Nasal Swab  Result Value Ref Range Status   MRSA, PCR NEGATIVE NEGATIVE Final   Staphylococcus aureus NEGATIVE NEGATIVE Final    Comment: (NOTE) The Xpert SA Assay (FDA approved for NASAL specimens in patients 93 years of age and older), is one component of a comprehensive surveillance program. It is not intended to diagnose infection nor to guide or monitor treatment. Performed at [facility], [place] [address] [place], [place] [address]      Time coordinating discharge: Over 30 minutes  SIGNED:   Staisha Winiarski J [place] DO  [facility] 05/01/2022, 8:52 AM

## 2022-04-30 NOTE — Progress Notes (Signed)
Subjective: No new complaints   Antibiotics:  Anti-infectives (From admission, onward)    Start     Dose/Rate Route Frequency Ordered Stop   04/29/22 1930  vancomycin (VANCOCIN) IVPB 1000 mg/200 mL premix        1,000 mg 200 mL/hr over 60 Minutes Intravenous Every 8 hours 04/29/22 0954     04/29/22 1045  ceFEPIme (MAXIPIME) 2 g in sodium chloride 0.9 % 100 mL IVPB        2 g 200 mL/hr over 30 Minutes Intravenous Every 8 hours 04/29/22 0952     04/29/22 1045  vancomycin (VANCOCIN) 2,250 mg in sodium chloride 0.9 % 500 mL IVPB        2,250 mg 261.3 mL/hr over 120 Minutes Intravenous  Once 04/29/22 0952 04/29/22 1324   04/28/22 1615  cefTRIAXone (ROCEPHIN) 2 g in sodium chloride 0.9 % 100 mL IVPB  Status:  Discontinued        2 g 200 mL/hr over 30 Minutes Intravenous Every 12 hours 04/28/22 1524 04/28/22 1530       Medications: Scheduled Meds:  traZODone  50 mg Oral QHS   zonisamide  100 mg Oral BID   Continuous Infusions:  ceFEPime (MAXIPIME) IV 2 g (04/30/22 0922)   vancomycin 1,000 mg (04/30/22 1116)   PRN Meds:.acetaminophen **OR** acetaminophen, HYDROmorphone (DILAUDID) injection, ondansetron **OR** ondansetron (ZOFRAN) IV, oxyCODONE    Objective: Weight change: -1.059 kg  Intake/Output Summary (Last 24 hours) at 04/30/2022 1312 Last data filed at 04/30/2022 0321 Gross per 24 hour  Intake 2706.91 ml  Output 1370 ml  Net 1336.91 ml   Blood pressure 138/76, pulse 91, temperature 98.1 F (36.7 C), temperature source Oral, resp. rate 16, height 5\' 6"  (1.676 m), weight 93.4 kg, last menstrual period 04/08/2022, SpO2 97 %. Temp:  [97.4 F (36.3 C)-98.7 F (37.1 C)] 98.1 F (36.7 C) (05/28 1239) Pulse Rate:  [69-106] 91 (05/28 1239) Resp:  [14-18] 16 (05/28 1239) BP: (113-138)/(69-87) 138/76 (05/28 1239) SpO2:  [92 %-97 %] 97 % (05/28 1239) Weight:  [93.4 kg] 93.4 kg (05/27 1440)  Physical Exam: Physical Exam Constitutional:      General: She is  not in acute distress.    Appearance: She is well-developed. She is not diaphoretic.  HENT:     Head: Normocephalic and atraumatic.     Right Ear: External ear normal.     Left Ear: External ear normal.     Mouth/Throat:     Pharynx: No oropharyngeal exudate.  Eyes:     General: No scleral icterus.    Conjunctiva/sclera: Conjunctivae normal.     Pupils: Pupils are equal, round, and reactive to light.  Cardiovascular:     Rate and Rhythm: Normal rate and regular rhythm.  Pulmonary:     Effort: Pulmonary effort is normal. No respiratory distress.     Breath sounds: No wheezing.  Abdominal:     General: Bowel sounds are normal. There is no distension.     Palpations: Abdomen is soft.     Tenderness: There is abdominal tenderness. There is rebound.  Musculoskeletal:        General: No tenderness. Normal range of motion.  Lymphadenopathy:     Cervical: No cervical adenopathy.  Skin:    General: Skin is warm and dry.     Coloration: Skin is not pale.     Findings: No erythema or rash.  Neurological:     General: No focal  deficit present.     Mental Status: She is alert and oriented to person, place, and time.     Motor: No abnormal muscle tone.     Coordination: Coordination normal.  Psychiatric:        Mood and Affect: Mood normal.        Behavior: Behavior normal.        Thought Content: Thought content normal.        Judgment: Judgment normal.     CBC:    BMET Recent Labs    04/27/22 1830 04/29/22 1002  NA 138 136  K 3.7 3.7  CL 107 107  CO2 23 22  GLUCOSE 112* 118*  BUN 9 10  CREATININE 0.73 0.75  CALCIUM 9.0 8.7*     Liver Panel  No results for input(s): PROT, ALBUMIN, AST, ALT, ALKPHOS, BILITOT, BILIDIR, IBILI in the last 72 hours.     Sedimentation Rate Recent Labs    04/27/22 1830  ESRSEDRATE 9   C-Reactive Protein Recent Labs    04/27/22 2239  CRP 1.2*    Micro Results: Recent Results (from the past 720 hour(s))  MRSA Next Gen by  PCR, Nasal     Status: None   Collection Time: 04/03/22 11:38 AM   Specimen: Nasal Mucosa; Nasal Swab  Result Value Ref Range Status   MRSA by PCR Next Gen NOT DETECTED NOT DETECTED Final    Comment: (NOTE) The GeneXpert MRSA Assay (FDA approved for NASAL specimens only), is one component of a comprehensive MRSA colonization surveillance program. It is not intended to diagnose MRSA infection nor to guide or monitor treatment for MRSA infections. Test performance is not FDA approved in patients less than 10 years old. Performed at [facility], [place] [address], [place], [place] [address]   Blood culture (routine x 2)     Status: None (Preliminary result)   Collection Time: 04/28/22 12:45 AM   Specimen: BLOOD RIGHT HAND  Result Value Ref Range Status   Specimen Description BLOOD RIGHT HAND  Final   Special Requests   Final    BOTTLES DRAWN AEROBIC AND ANAEROBIC Blood Culture adequate volume   Culture   Final    NO GROWTH 1 DAY Performed at [facility], [place] [address], [place], [place] [address]    Report Status PENDING  Incomplete  Blood culture (routine x 2)     Status: None (Preliminary result)   Collection Time: 04/28/22 12:45 AM   Specimen: BLOOD LEFT HAND  Result Value Ref Range Status   Specimen Description BLOOD LEFT HAND  Final   Special Requests   Final    BOTTLES DRAWN AEROBIC AND ANAEROBIC Blood Culture adequate volume   Culture   Final    NO GROWTH 1 DAY Performed at [facility], [place] [address] [place], [place] [address]    Report Status PENDING  Incomplete  CSF culture w Gram Stain     Status: None (Preliminary result)   Collection Time: 04/28/22  1:57 PM   Specimen: CSF; Cerebrospinal Fluid  Result Value Ref Range Status   Specimen Description CSF  Final   Special Requests Normal  Final   Gram Stain   Final    WBC PRESENT,BOTH PMN AND MONONUCLEAR NO ORGANISMS SEEN CYTOSPIN SMEAR    Culture   Final    NO GROWTH 2 DAYS Performed at  [facility], [place] [address] [place], [place] [address]  Report Status PENDING  Incomplete  Surgical pcr screen     Status: None   Collection Time: 04/29/22 11:12 AM   Specimen: Nasal Mucosa; Nasal Swab  Result Value Ref Range Status   MRSA, PCR NEGATIVE NEGATIVE Final   Staphylococcus aureus NEGATIVE NEGATIVE Final    Comment: (NOTE) The Xpert SA Assay (FDA approved for NASAL specimens in patients 49 years of age and older), is one component of a comprehensive surveillance program. It is not intended to diagnose infection nor to guide or monitor treatment. Performed at [facility], [place] [address] [place], [place] [address]     Studies/Results: DG Lumbar Spine 2-3 Views  Result Date: 04/29/2022 CLINICAL DATA:  Portable lateral view of the lumbar spine for lumbo peritoneal shunt placement. EXAM: LUMBAR SPINE - 2-3 VIEW Fluoro time: 25 seconds.  Dose: 16.27 mGy.  Views: 2. COMPARISON:  Lumbar MRI, 10/19/2021. FINDINGS: Lateral view shows placement of a catheter or dilator with its tip projecting posterior to the upper endplate of L4. IMPRESSION: Portable imaging provided for lumbo peritoneal shunt placement. Electronically Signed   By: Lajean Manes M.D.   On: 04/29/2022 18:40   DG Abd 2 Views  Result Date: 04/30/2022 CLINICAL DATA:  Postop.  History of Chiari malformation. EXAM: ABDOMEN - 2 VIEW COMPARISON:  None Available. FINDINGS: There is a lumbar shunt catheter which enters the canal at the left L3-4 neuro foramina. The shunt catheter terminates at the approximate T12-L1 level. The distal portion of the shunt catheter terminates within the ventral abdomen at the approximate L3-4 level. Bowel gas pattern appears nonobstructed. Moderate stool burden noted within the colon. IMPRESSION: Anterior lumbar shunt catheter is identified which enters the canal the of the left L3-4 neural foramina. Electronically Signed   By: Kerby Moors M.D.   On: 04/30/2022 10:25   DG C-Arm  1-60 Min-No Report  Result Date: 04/29/2022 Fluoroscopy was utilized by the requesting physician.  No radiographic interpretation.   DG C-Arm 1-60 Min-No Report  Result Date: 04/29/2022 Fluoroscopy was utilized by the requesting physician.  No radiographic interpretation.   DG C-Arm 1-60 Min-No Report  Result Date: 04/29/2022 Fluoroscopy was utilized by the requesting physician.  No radiographic interpretation.   DG FL GUIDED LUMBAR PUNCTURE  Result Date: 04/28/2022 CLINICAL DATA:  Recent history of Chiari decompression. Pseudomeningocele, headaches. Request for diagnostic lumbar puncture. EXAM: DIAGNOSTIC LUMBAR PUNCTURE UNDER FLUOROSCOPIC GUIDANCE COMPARISON:  None Available. FLUOROSCOPY: Radiation Exposure Index (as provided by the fluoroscopic device): 1.50 mGy Kerma PROCEDURE: Informed consent was obtained from the patient prior to the procedure, including potential complications of headache, allergy, and pain. With the patient prone, the lower back was prepped with Betadine. 1% Lidocaine was used for local anesthesia. Lumbar puncture was performed at the L2-L3 level using a 20 gauge needle with return of clear CSF with an opening pressure of 29 cm water. Approximately 10 ml of CSF were obtained for laboratory studies. Closing pressure measured at 20 cm water. The patient tolerated the procedure well and there were no apparent complications. IMPRESSION: Successful image guided lumbar puncture at L2-L3 level as described above. Performed by: Ascencion Dike PA-C, supervised by Dr. Macy Mis Electronically Signed   By: Macy Mis M.D.   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 female with  with history of Arnold-Chiari  malformation with recurrent headaches who underwent posterior occipital decompression on Apr 03, 2022.  She had been doing well postoperatively on Medrol Dosepak due to some headaches.  As the Medrol dose pack was completed her headaches worsened abruptly.  The scan and shown a fluid collection in the suboccipital space that was 39 x 50 mm increase in size was a prior CT.  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  Gram stain and cultures were negative with her not being on antibiotics.  She ultimately went for VP shunt placement yesterday.  Placed on vancomycin and cefepime initially due to concerns that this could represent an infection but the cultures remain negative and I think that Dr Zada Finders is correct that CSF pleocytosis is due to recent surgical intervention and not due to active infection.  I will continue to have her on my list of follow-up cultures should they turn positive though I would be very surprised I am comfortable having her antibiotics stopped at discharge.  I spent 36  minutes with the patient including than 50% of the time in face to face counseling of the patient regarding her CSF Lisa ptosis  along with review of medical records in preparation for the visit and during the visit and in coordination of her care with Dr. [place]    LOS: 2 days   Alcide Evener 04/30/2022, 1:12 PM

## 2022-05-01 ENCOUNTER — Encounter: Payer: Self-pay | Admitting: Psychiatry

## 2022-05-01 DIAGNOSIS — G919 Hydrocephalus, unspecified: Secondary | ICD-10-CM

## 2022-05-01 MED ORDER — POLYETHYLENE GLYCOL 3350 17 G PO PACK: 17.0000 g | PACK | Freq: Every day | ORAL | 0 refills | Status: DC | PRN

## 2022-05-01 MED ORDER — METHOCARBAMOL 500 MG PO TABS: 500.0000 mg | ORAL_TABLET | Freq: Four times a day (QID) | ORAL | Status: DC | PRN

## 2022-05-01 MED ORDER — METHOCARBAMOL 500 MG PO TABS
500.0000 mg | ORAL_TABLET | Freq: Four times a day (QID) | ORAL | 0 refills | Status: DC | PRN
Start: 2022-05-01 — End: 2022-05-01

## 2022-05-01 MED ORDER — METHOCARBAMOL 500 MG PO TABS: 500.0000 mg | ORAL_TABLET | Freq: Four times a day (QID) | ORAL | 0 refills | Status: AC | PRN

## 2022-05-01 MED ORDER — SENNOSIDES-DOCUSATE SODIUM 8.6-50 MG PO TABS: 1.0000 | ORAL_TABLET | Freq: Two times a day (BID) | ORAL | 0 refills | Status: DC

## 2022-05-01 MED ORDER — OXYCODONE HCL 5 MG PO TABS: 5.0000 mg | ORAL_TABLET | ORAL | 0 refills | Status: AC | PRN

## 2022-05-01 MED ORDER — OXYCODONE HCL 5 MG PO TABS: 5.0000 mg | ORAL_TABLET | ORAL | 0 refills | Status: DC | PRN

## 2022-05-01 MED ORDER — METHOCARBAMOL 500 MG PO TABS
500.0000 mg | ORAL_TABLET | Freq: Four times a day (QID) | ORAL | Status: DC
  Administered 2022-05-01 (×2): 500 mg via ORAL
  Filled 2022-05-01 (×2): qty 1

## 2022-05-01 MED ORDER — SENNOSIDES-DOCUSATE SODIUM 8.6-50 MG PO TABS: 1.0000 | ORAL_TABLET | Freq: Two times a day (BID) | ORAL | 0 refills | Status: AC

## 2022-05-01 NOTE — Evaluation (Signed)
Physical Therapy Evaluation Patient Details Name: Sabrina Diaz MRN: [id number] DOB: [birth year] Today's Date: 05/01/2022  History of Present Illness  Pt is a 26 y/o female admitted secondary to worsening headache following chiari malformation decompression. Pt is s/p  Lumboperitoneal shunt placement. PMH includes chiari malformation.  Clinical Impression  Pt admitted secondary to problem above with deficits below. Pt requiring min A for bed mobility and min guard to supervision assist with transfers and gait. Educated about movement techniques (log roll, bracing) to help with pain management at home and walking program to perform at home. Pt with excellent family support at home. No further skilled PT needs at this time as all education completed. Will sign off. If needs change, please re-consult.        Recommendations for follow up therapy are one component of a multi-disciplinary discharge planning process, led by the attending physician.  Recommendations may be updated based on patient status, additional functional criteria and insurance authorization.  Follow Up Recommendations No PT follow up    Assistance Recommended at Discharge Intermittent Supervision/Assistance  Patient can return home with the following  Assistance with cooking/housework;A little help with bathing/dressing/bathroom;Help with stairs or ramp for entrance    Equipment Recommendations None recommended by PT  Recommendations for Other Services       Functional Status Assessment Patient has had a recent decline in their functional status and demonstrates the ability to make significant improvements in function in a reasonable and predictable amount of time.     Precautions / Restrictions Precautions Precautions: Fall Restrictions Weight Bearing Restrictions: No      Mobility  Bed Mobility Overal bed mobility: Needs Assistance Bed Mobility: Rolling, Sidelying to Sit Rolling: Min guard Sidelying to sit: Min  assist       General bed mobility comments: Educated about using log roll technique to help with pain management. ASsist for trunk elevation    Transfers Overall transfer level: Needs assistance Equipment used: 1 person hand held assist Transfers: Sit to/from Stand Sit to Stand: Min guard           General transfer comment: Min guard for safety    Ambulation/Gait Ambulation/Gait assistance: Supervision Gait Distance (Feet): 250 Feet Assistive device: None Gait Pattern/deviations: Step-through pattern, Decreased stride length Gait velocity: Decreased     General Gait Details: Slow, guarded gait. No LOB noted. Educated about walking program to perform at home.  Stairs            Wheelchair Mobility    Modified Rankin (Stroke Patients Only)       Balance Overall balance assessment: Mild deficits observed, not formally tested                                           Pertinent Vitals/Pain Pain Assessment Pain Assessment: Faces Faces Pain Scale: Hurts little more Pain Location: abdomen Pain Descriptors / Indicators: Grimacing, Guarding Pain Intervention(s): Limited activity within patient's tolerance, Monitored during session, Repositioned    Home Living Family/patient expects to be discharged to:: Private residence Living Arrangements: Spouse/significant other;Parent Available Help at Discharge: Family Type of Home: House Home Access: Stairs to enter Entrance Stairs-Rails: Right;Left;Can reach both Secretary/administrator of Steps: 3   Home Layout: One level Home Equipment: Grab bars - tub/shower      Prior Function Prior Level of Function : Needs assist  Mobility Comments: independent ADLs Comments: Family assisted with LE washing and dressing.     Hand Dominance        Extremity/Trunk Assessment   Upper Extremity Assessment Upper Extremity Assessment: Overall WFL for tasks assessed    Lower Extremity  Assessment Lower Extremity Assessment: Overall WFL for tasks assessed    Cervical / Trunk Assessment Cervical / Trunk Assessment: Other exceptions Cervical / Trunk Exceptions: abdominal pain from procedure  Communication   Communication: No difficulties  Cognition Arousal/Alertness: Awake/alert Behavior During Therapy: WFL for tasks assessed/performed Overall Cognitive Status: Within Functional Limits for tasks assessed                                          General Comments      Exercises     Assessment/Plan    PT Assessment Patient does not need any further PT services  PT Problem List         PT Treatment Interventions      PT Goals (Current goals can be found in the Care Plan section)  Acute Rehab PT Goals Patient Stated Goal: to go home PT Goal Formulation: With patient/family Time For Goal Achievement: 05/01/22 Potential to Achieve Goals: Good    Frequency       Co-evaluation               AM-PAC PT "6 Clicks" Mobility  Outcome Measure Help needed turning from your back to your side while in a flat bed without using bedrails?: A Little Help needed moving from lying on your back to sitting on the side of a flat bed without using bedrails?: A Little Help needed moving to and from a bed to a chair (including a wheelchair)?: A Little Help needed standing up from a chair using your arms (e.g., wheelchair or bedside chair)?: A Little Help needed to walk in hospital room?: A Little Help needed climbing 3-5 steps with a railing? : A Little 6 Click Score: 18    End of Session Equipment Utilized During Treatment: Gait belt Activity Tolerance: Patient tolerated treatment well Patient left: Other (comment);with family/visitor present (in bathroom with family present) Nurse Communication: Mobility status PT Visit Diagnosis: Other abnormalities of gait and mobility (R26.89)    Time: 0086-7619 PT Time Calculation (min) (ACUTE ONLY): 15  min   Charges:   PT Evaluation $PT Eval Low Complexity: 1 Low          Cindee Salt, DPT  Acute Rehabilitation Services  Pager: [phone] Office: [phone]   Lehman Prom 05/01/2022, 12:40 PM

## 2022-05-01 NOTE — Progress Notes (Signed)
Subjective: Patient reports doing well and is pleased with her postoperative status. She has appropriate incisional pain. No acute events overnight.   Objective: Vital signs in last 24 hours: Temp:  [98.1 F (36.7 C)-98.8 F (37.1 C)] 98.8 F (37.1 C) (05/29 0346) Pulse Rate:  [88-110] 110 (05/29 0346) Resp:  [15-16] 16 (05/28 1631) BP: (115-138)/(74-81) 123/76 (05/29 0346) SpO2:  [93 %-98 %] 95 % (05/29 0346) Weight:  [94.6 kg] 94.6 kg (05/29 0500)  Intake/Output from previous day: 05/28 0701 - 05/29 0700 In: 779.4 [I.V.:279.4; IV Piggyback:500] Out: -  Intake/Output this shift: No intake/output data recorded.  Physical Exam: Patient is awake, A/O X 4, conversant, and in good spirits. Eyes open spontaneously. They are in NAD and VSS. Doing well. Speech is fluent and appropriate. MAEW with good strength that is symmetric bilaterally.  BUE 5/5 throughout, BLE 5/5 throughout. Sensation to light touch is intact. PERLA, EOMI. CNs grossly intact. Posterior cervical incision with no clear evidence of drainage, no erythema / induration. Anterior and left lateral incisions are well approximated with no drainage, erythema, or edema.  Lab Results: No results for input(s): WBC, HGB, HCT, PLT in the last 72 hours. BMET Recent Labs    04/29/22 1002  NA 136  K 3.7  CL 107  CO2 22  GLUCOSE 118*  BUN 10  CREATININE 0.75  CALCIUM 8.7*    Studies/Results: DG Lumbar Spine 2-3 Views  Result Date: 04/29/2022 CLINICAL DATA:  Portable lateral view of the lumbar spine for lumbo peritoneal shunt placement. EXAM: LUMBAR SPINE - 2-3 VIEW Fluoro time: 25 seconds.  Dose: 16.27 mGy.  Views: 2. COMPARISON:  Lumbar MRI, 10/19/2021. FINDINGS: Lateral view shows placement of a catheter or dilator with its tip projecting posterior to the upper endplate of L4. IMPRESSION: Portable imaging provided for lumbo peritoneal shunt placement. Electronically Signed   By: Amie Portland M.D.   On: 04/29/2022 18:40    DG Abd 2 Views  Result Date: 04/30/2022 CLINICAL DATA:  Postop.  History of Chiari malformation. EXAM: ABDOMEN - 2 VIEW COMPARISON:  None Available. FINDINGS: There is a lumbar shunt catheter which enters the canal at the left L3-4 neuro foramina. The shunt catheter terminates at the approximate T12-L1 level. The distal portion of the shunt catheter terminates within the ventral abdomen at the approximate L3-4 level. Bowel gas pattern appears nonobstructed. Moderate stool burden noted within the colon. IMPRESSION: Anterior lumbar shunt catheter is identified which enters the canal the of the left L3-4 neural foramina. Electronically Signed   By: Signa Kell M.D.   On: 04/30/2022 10:25   DG C-Arm 1-60 Min-No Report  Result Date: 04/29/2022 Fluoroscopy was utilized by the requesting physician.  No radiographic interpretation.   DG C-Arm 1-60 Min-No Report  Result Date: 04/29/2022 Fluoroscopy was utilized by the requesting physician.  No radiographic interpretation.   DG C-Arm 1-60 Min-No Report  Result Date: 04/29/2022 Fluoroscopy was utilized by the requesting physician.  No radiographic interpretation.    Assessment/Plan: 26 y.o. female who is POD#2 s/p lumboperitoneal shunt placement. She is continuing to recover well. Her neurological examination is stable. No complaints of N/V. Headaches have nearly resolved. She had a moderate headache overnight but was of a different character and was less severe than what she has been experiencing. Posterior cervical incision is well healed without any fluctuance or concerning features. She has appropriate anterior and left lateral incisional discomfort. Doing well. She is appropriate for discharge from a neurosurgical perspective.  LOS: 3 days     Council Mechanic, DNP, AGNP-C Neurosurgery Nurse Practitioner  [facility] [address], [place], [place] [address] P: [phone]    F:  [phone]  05/01/2022 7:14 AM

## 2022-05-02 LAB — CSF CULTURE W GRAM STAIN
Culture: NO GROWTH
Special Requests: NORMAL

## 2022-05-02 MED ORDER — TRAZODONE HCL 100 MG PO TABS: 100.0000 mg | ORAL_TABLET | Freq: Every day | ORAL | 6 refills | Status: DC

## 2022-05-03 ENCOUNTER — Other Ambulatory Visit: Payer: Self-pay

## 2022-05-03 ENCOUNTER — Encounter (HOSPITAL_COMMUNITY): Payer: Self-pay | Admitting: Neurological Surgery

## 2022-05-03 ENCOUNTER — Inpatient Hospital Stay (HOSPITAL_COMMUNITY)
Admission: EM | Admit: 2022-05-03 | Discharge: 2022-05-06 | DRG: 103 | Disposition: A | Discharge status: Home / Self Care | Payer: 59 | Attending: Neurological Surgery | Admitting: Neurological Surgery

## 2022-05-03 ENCOUNTER — Emergency Department (HOSPITAL_COMMUNITY): Payer: 59

## 2022-05-03 DIAGNOSIS — Z982 Presence of cerebrospinal fluid drainage device: Secondary | ICD-10-CM

## 2022-05-03 DIAGNOSIS — R519 Headache, unspecified: Principal | ICD-10-CM | POA: Diagnosis present

## 2022-05-03 DIAGNOSIS — R51 Headache with orthostatic component, not elsewhere classified: Secondary | ICD-10-CM

## 2022-05-03 DIAGNOSIS — Z79891 Long term (current) use of opiate analgesic: Secondary | ICD-10-CM

## 2022-05-03 DIAGNOSIS — Z79899 Other long term (current) drug therapy: Secondary | ICD-10-CM

## 2022-05-03 DIAGNOSIS — Z87891 Personal history of nicotine dependence: Secondary | ICD-10-CM

## 2022-05-03 LAB — URINALYSIS, ROUTINE W REFLEX MICROSCOPIC
Bilirubin Urine: NEGATIVE
Glucose, UA: NEGATIVE mg/dL
Ketones, ur: 5 mg/dL — AB
Leukocytes,Ua: NEGATIVE
Nitrite: NEGATIVE
Protein, ur: NEGATIVE mg/dL
Specific Gravity, Urine: 1.018 (ref 1.005–1.030)
pH: 6 (ref 5.0–8.0)

## 2022-05-03 LAB — CBC WITH DIFFERENTIAL/PLATELET
Abs Immature Granulocytes: 0.07 10*3/uL (ref 0.00–0.07)
Basophils Absolute: 0 10*3/uL (ref 0.0–0.1)
Basophils Relative: 0 %
Eosinophils Absolute: 0.2 10*3/uL (ref 0.0–0.5)
Eosinophils Relative: 1 %
HCT: 36 % (ref 36.0–46.0)
Hemoglobin: 11.9 g/dL — ABNORMAL LOW (ref 12.0–15.0)
Immature Granulocytes: 0 %
Lymphocytes Relative: 12 %
Lymphs Abs: 1.8 10*3/uL (ref 0.7–4.0)
MCH: 29.9 pg (ref 26.0–34.0)
MCHC: 33.1 g/dL (ref 30.0–36.0)
MCV: 90.5 fL (ref 80.0–100.0)
Monocytes Absolute: 1 10*3/uL (ref 0.1–1.0)
Monocytes Relative: 6 %
Neutro Abs: 12.6 10*3/uL — ABNORMAL HIGH (ref 1.7–7.7)
Neutrophils Relative %: 81 %
Platelets: 308 10*3/uL (ref 150–400)
RBC: 3.98 MIL/uL (ref 3.87–5.11)
RDW: 13.3 % (ref 11.5–15.5)
WBC: 15.7 10*3/uL — ABNORMAL HIGH (ref 4.0–10.5)
nRBC: 0 % (ref 0.0–0.2)

## 2022-05-03 LAB — CULTURE, BLOOD (ROUTINE X 2)
Culture: NO GROWTH
Culture: NO GROWTH
Special Requests: ADEQUATE
Special Requests: ADEQUATE

## 2022-05-03 LAB — COMPREHENSIVE METABOLIC PANEL
ALT: 31 U/L (ref 0–44)
AST: 14 U/L — ABNORMAL LOW (ref 15–41)
Albumin: 3.5 g/dL (ref 3.5–5.0)
Alkaline Phosphatase: 74 U/L (ref 38–126)
Anion gap: 11 (ref 5–15)
BUN: 8 mg/dL (ref 6–20)
CO2: 24 mmol/L (ref 22–32)
Calcium: 8.9 mg/dL (ref 8.9–10.3)
Chloride: 102 mmol/L (ref 98–111)
Creatinine, Ser: 0.8 mg/dL (ref 0.44–1.00)
GFR, Estimated: >60 mL/min (ref >60)
Glucose, Bld: 123 mg/dL — ABNORMAL HIGH (ref 70–99)
Potassium: 3.4 mmol/L — ABNORMAL LOW (ref 3.5–5.1)
Sodium: 137 mmol/L (ref 135–145)
Total Bilirubin: 0.5 mg/dL (ref 0.3–1.2)
Total Protein: 6.9 g/dL (ref 6.5–8.1)

## 2022-05-03 LAB — LACTIC ACID, PLASMA
Lactic Acid, Venous: 0.7 mmol/L (ref 0.5–1.9)
Lactic Acid, Venous: 1.2 mmol/L (ref 0.5–1.9)

## 2022-05-03 LAB — PATHOLOGIST SMEAR REVIEW

## 2022-05-03 LAB — I-STAT BETA HCG BLOOD, ED (MC, WL, AP ONLY): I-stat hCG, quantitative: 5.5 m[IU]/mL — ABNORMAL HIGH (ref <5)

## 2022-05-03 MED ORDER — SODIUM CHLORIDE 0.9 % IV BOLUS
500.0000 mL | Freq: Once | INTRAVENOUS | Status: AC
  Administered 2022-05-03: 500 mL via INTRAVENOUS

## 2022-05-03 MED ORDER — ONDANSETRON HCL 4 MG/2ML IJ SOLN
4.0000 mg | Freq: Once | INTRAMUSCULAR | Status: AC
Start: 2022-05-03 — End: 2022-05-03
  Administered 2022-05-03: 4 mg via INTRAVENOUS
  Filled 2022-05-03: qty 2

## 2022-05-03 MED ORDER — HYDROMORPHONE HCL 1 MG/ML IJ SOLN
0.5000 mg | Freq: Once | INTRAMUSCULAR | Status: AC
  Administered 2022-05-03: 0.5 mg via INTRAVENOUS
  Filled 2022-05-03: qty 1

## 2022-05-03 NOTE — ED Notes (Signed)
Patient ambulates to the restroom without assist

## 2022-05-03 NOTE — H&P (Signed)
Sabrina Diaz is an 26 y.o. female.   HPI:  26 year old patient presented to the ED tonight with one day of low grade fever, headaches, NV, some swelling over her incision. Denies any drainage from her incision. She had a chiari decompression at the beginning of May and then an LP shunt placed 4 days ago. She was just discharged Monday. States that she feels like she felt before her shunt was placed.   Past Medical History:  Diagnosis Date   Chiari malformation type I ([place])    Chronic headaches     Past Surgical History:  Procedure Laterality Date   SUBOCCIPITAL CRANIECTOMY CERVICAL LAMINECTOMY N/A 04/03/2022   Procedure: Chiari decompression;  Surgeon: Judith Part, MD;  Location: [place];  Service: Neurosurgery;  Laterality: N/A;  RM 21   VENTRICULOPERITONEAL SHUNT N/A 04/29/2022   Procedure: LUMBOPERITONEAL SHUNT PLACEMENT;  Surgeon: Judith Part, MD;  Location: [place];  Service: Neurosurgery;  Laterality: N/A;    No Known Allergies  Social History   Tobacco Use   Smoking status: Former    Types: Cigarettes    Quit date: 07/2021    Years since quitting: 0.8   Smokeless tobacco: Never  Substance Use Topics   Alcohol use: Yes    Comment: socially    History reviewed. No pertinent family history.   Review of Systems  Positive ROS: as above  All other systems have been reviewed and were otherwise negative with the exception of those mentioned in the HPI and as above.  Objective: Vital signs in last 24 hours: Temp:  [99.1 F (37.3 C)] 99.1 F (37.3 C) (05/31 1550) Pulse Rate:  [75-98] 85 (05/31 1745) Resp:  [18-20] 18 (05/31 1745) BP: (117-127)/(80-102) 123/80 (05/31 1745) SpO2:  [95 %-97 %] 95 % (05/31 1745) Weight:  [94.6 kg] 94.6 kg (05/31 1551)  General Appearance: Alert, cooperative, no distress, appears stated age Head: Normocephalic, without obvious abnormality, atraumatic Eyes: PERRL, conjunctiva/corneas clear, EOM's intact, fundi benign, both eyes       Back: Symmetric, no curvature, ROM normal, no CVA tenderness,  Lungs: respirations unlabored Heart: Regular rate and rhythm Extremities: Extremities normal, atraumatic, no cyanosis or edema Pulses: 2+ and symmetric all extremities Skin: Skin color, texture, turgor normal, no rashes or lesions  NEUROLOGIC:   Mental status: A&O x4, no aphasia, good attention span, Memory and fund of knowledge Motor Exam - grossly normal, normal tone and bulk Sensory Exam - grossly normal Reflexes: symmetric, no pathologic reflexes, No Hoffman's, No clonus Coordination - grossly normal Cranial Nerves: I: smell Not tested  II: visual acuity  OS: na    OD: na  II: visual fields Full to confrontation  II: pupils Equal, round, reactive to light  III,VII: ptosis None  III,IV,VI: extraocular muscles  Full ROM  V: mastication   V: facial light touch sensation    V,VII: corneal reflex    VII: facial muscle function - upper    VII: facial muscle function - lower   VIII: hearing   IX: soft palate elevation    IX,X: gag reflex   XI: trapezius strength    XI: sternocleidomastoid strength   XI: neck flexion strength    XII: tongue strength      Data Review Lab Results  Component Value Date   WBC 15.7 (H) 05/03/2022   HGB 11.9 (L) 05/03/2022   HCT 36.0 05/03/2022   MCV 90.5 05/03/2022   PLT 308 05/03/2022   Lab Results  Component Value Date   NA 137 05/03/2022   K 3.4 (L) 05/03/2022   CL 102 05/03/2022   CO2 24 05/03/2022   BUN 8 05/03/2022   CREATININE 0.80 05/03/2022   GLUCOSE 123 (H) 05/03/2022   No results found for: INR, PROTIME  Radiology: No results found.   Assessment/Plan: 26 year old female presented to the ED tonight with progressive symptoms of NV and HA after an LP shunt placement last Saturday. CT head shows some enlargement of her ventricles compared to previous scan. Her incision is not draining but patient feels it has been swelling more. I do think she is probably being  underdrained as the small left sided SDH is gone and her ventricle size has enlarged some. Therefore we will adjust her shunt and admit her for observation. Will also place her on steroids and diamox to help with her symptoms.   [name] [facility] 05/03/2022 6:47 PM

## 2022-05-03 NOTE — ED Triage Notes (Signed)
Recently had VP shunt placed after chiari malformation decompression surgery.  Patient complains of headache, vomiting and neck stiffness along with fever and generalized body aches.

## 2022-05-03 NOTE — ED Provider Triage Note (Signed)
Emergency Medicine Provider Triage Evaluation Note  Sabrina Diaz , a 26 y.o. female  was evaluated in triage.  Pt complains of nausea, vomiting, fever, stiff neck and back that began yesterday and progressed today.  Patient had a LP shunt placed on Saturday.  Patient was okay on Monday.  Yesterday began to have nausea and vomiting.  Today noticed a low-grade fever.  Patient called her surgeon, Dr. Venetia Constable, who recommended she return to the emergency department for further imaging and work-up  Review of Systems  Positive: Nausea, vomiting, fever Negative: Chest pain, shortness of breath  Physical Exam  BP 127/86 (BP Location: Right Arm)   Pulse 98   Temp 99.1 F (37.3 C) (Oral)   Resp 20   Ht 5\' 6"  (1.676 m)   Wt 94.6 kg   LMP 04/08/2022 (Approximate)   SpO2 95%   BMI 33.66 kg/m  Gen:   Awake, no distress   Resp:  Normal effort  MSK:   Moves extremities without difficulty  Other:    Medical Decision Making  Medically screening exam initiated at 4:02 PM.  Appropriate orders placed.  Aletha Halim Bottari was informed that the remainder of the evaluation will be completed by another provider, this initial triage assessment does not replace that evaluation, and the importance of remaining in the ED until their evaluation is complete.     Dorothyann Peng, PA-C 05/03/22 1603

## 2022-05-03 NOTE — ED Provider Notes (Signed)
[facility] Provider Note   CSN: [id number] Arrival date & time: 05/03/22  1532     History  Chief Complaint  Patient presents with   Fever    Sabrina Diaz is a 26 y.o. female.  Patient with history of Chiari malformation, status post decompression on 04/03/2022 by Dr. Zada Finders, lumboperitoneal shunt placement on 5/27 after recurrent headache and head pressure, evidence of hydrocephalus on CT --presents to the emergency department today for evaluation of worsening headache.  Headache described as a throbbing pain.  She has had associated nausea and vomiting as well.  Symptoms feel the same as prior to having this shunt placed.  She is also had mildly increased temperature, Tmax 99.7 F.  She has noticed some redness around the incision site on her left flank which has gotten slightly larger and more warm.  Pain radiates down the back of her spine.  She is unable to lie flat because this makes her headache and back pain worse.  CSF studies performed on previous hospitalization showed white blood cell count, no culture growth.  She was briefly on antibiotics but discontinued at discharge.      Home Medications Prior to Admission medications   Medication Sig Start Date End Date Taking? Authorizing Provider  acetaminophen (TYLENOL) 500 MG tablet Take 1,000 mg by mouth every 6 (six) hours as needed for moderate pain or headache.    [provider]  ibuprofen (ADVIL) 200 MG tablet Take 800 mg by mouth every 6 (six) hours as needed for moderate pain or headache.    [provider]  methocarbamol (ROBAXIN) 500 MG tablet Take 1 tablet (500 mg total) by mouth every 6 (six) hours as needed for up to 14 days for muscle spasms. 05/01/22 05/15/22  [place] Eric J, DO  oxyCODONE (OXY IR/ROXICODONE) 5 MG immediate release tablet Take 1 tablet (5 mg total) by mouth every 4 (four) hours as needed for up to 7 days for moderate pain. 05/01/22 05/08/22   [place] Donnamarie Poag, DO  polyethylene glycol (MIRALAX / GLYCOLAX) 17 g packet Take 17 g by mouth daily as needed for up to 14 days for mild constipation. 05/01/22 05/15/22  [place] Donnamarie Poag, DO  rizatriptan (MAXALT) 10 MG tablet Take 1 tablet (10 mg total) by mouth as needed for migraine. May repeat in 2 hours if needed 04/26/22   Genia Harold, MD  senna-docusate (SENOKOT-S) 8.6-50 MG tablet Take 1 tablet by mouth 2 (two) times daily for 14 days. 05/01/22 05/15/22  [place] Donnamarie Poag, DO  traZODone (DESYREL) 100 MG tablet Take 1 tablet (100 mg total) by mouth at bedtime. 05/02/22   Genia Harold, MD  zonisamide (ZONEGRAN) 100 MG capsule Take 2 capsules (200 mg total) by mouth daily. Take 100 mg (1 pill) daily for two weeks, then increase to 200 mg (2 pills) daily. Patient taking differently: Take 100 mg by mouth 2 (two) times daily. 04/26/22   Genia Harold, MD      Allergies    Patient has no known allergies.    Review of Systems   Review of Systems  Physical Exam Updated Vital Signs BP 127/86 (BP Location: Right Arm)   Pulse 98   Temp 99.1 F (37.3 C) (Oral)   Resp 20   Ht 5\' 6"  (1.676 m)   Wt 94.6 kg   LMP 04/08/2022 (Approximate)   SpO2 95%   BMI 33.66 kg/m   Physical Exam Vitals and nursing note reviewed.  Constitutional:      General: She is not in acute distress.    Appearance: She is well-developed.  HENT:     Head: Normocephalic and atraumatic.     Right Ear: Tympanic membrane, ear canal and external ear normal.     Left Ear: Tympanic membrane, ear canal and external ear normal.     Nose: Nose normal.     Mouth/Throat:     Pharynx: Uvula midline.  Eyes:     General: Lids are normal.     Extraocular Movements:     Right eye: No nystagmus.     Left eye: No nystagmus.     Conjunctiva/sclera: Conjunctivae normal.     Pupils: Pupils are equal, round, and reactive to light.  Cardiovascular:     Rate and Rhythm: Normal rate and regular rhythm.     Heart sounds: No murmur  heard. Pulmonary:     Effort: Pulmonary effort is normal. No respiratory distress.     Breath sounds: Normal breath sounds. No wheezing, rhonchi or rales.  Abdominal:     Palpations: Abdomen is soft.     Tenderness: There is no abdominal tenderness. There is no guarding or rebound.  Musculoskeletal:     Cervical back: Normal range of motion and neck supple. No tenderness or bony tenderness.     Right lower leg: No edema.     Left lower leg: No edema.  Skin:    General: Skin is warm and dry.     Findings: No rash.     Comments: Evaluated previous occipital decompression surgical site: There is some mild erythema and fullness especially over the superior aspect of this location.  Evaluated most recent surgical incision sites on lower back, left abdomen, left flank.  There is erythema spreading inferiorly from the left flank incision with mild warmth.  No drainage from any of the incisions.  Lumbar incision is clean intact.  Only mild erythema around the abdominal incision.  Neurological:     General: No focal deficit present.     Mental Status: She is alert and oriented to person, place, and time. Mental status is at baseline.     GCS: GCS eye subscore is 4. GCS verbal subscore is 5. GCS motor subscore is 6.     Cranial Nerves: No cranial nerve deficit.     Sensory: No sensory deficit.     Motor: No weakness.     Coordination: Coordination normal.     Comments: Patient prefers to sit up.  Psychiatric:        Mood and Affect: Mood normal.    ED Results / Procedures / Treatments   Labs (all labs ordered are listed, but only abnormal results are displayed) Labs Reviewed  COMPREHENSIVE METABOLIC PANEL - Abnormal; Notable for the following components:      Result Value   Potassium 3.4 (*)    Glucose, Bld 123 (*)    AST 14 (*)    All other components within normal limits  CBC WITH DIFFERENTIAL/PLATELET - Abnormal; Notable for the following components:   WBC 15.7 (*)    Hemoglobin  11.9 (*)    Neutro Abs 12.6 (*)    All other components within normal limits  URINALYSIS, ROUTINE W REFLEX MICROSCOPIC - Abnormal; Notable for the following components:   APPearance HAZY (*)    Hgb urine dipstick LARGE (*)    Ketones, ur 5 (*)    Bacteria, UA RARE (*)    All other  components within normal limits  I-STAT BETA HCG BLOOD, ED ([facility], [facility], [facility] ONLY) - Abnormal; Notable for the following components:   I-stat hCG, quantitative 5.5 (*)    All other components within normal limits  LACTIC ACID, PLASMA  LACTIC ACID, PLASMA  PREGNANCY, URINE    EKG None  Radiology CT HEAD WO CONTRAST (5MM)  Result Date: 05/03/2022 CLINICAL DATA:  Worsening headache, recent occipital craniotomy for treatment of Chiari malformation EXAM: CT HEAD WITHOUT CONTRAST TECHNIQUE: Contiguous axial images were obtained from the base of the skull through the vertex without intravenous contrast. RADIATION DOSE REDUCTION: This exam was performed according to the departmental dose-optimization program which includes automated exposure control, adjustment of the mA and/or kV according to patient size and/or use of iterative reconstruction technique. COMPARISON:  04/27/2022 FINDINGS: Brain: No acute intracranial findings are seen. There is interval resolution of small CSF collection in the periphery of left frontal and parietal lobes. There is interval clearing of CSF collection anterior to the right cerebellum. Basilar cisterns are better visualized in the current study possibly suggesting improvement in intracranial hypotension. There is midline occipital craniotomy. Low-density seen posterior to the craniotomy site has not changed significantly. Vascular: Unremarkable. Skull: No recent fracture is seen. There is previous occipital craniotomy. Sinuses/Orbits: Unremarkable. Other: None. IMPRESSION: No acute intracranial findings are seen. Postsurgical changes are noted in the posterior cranial fossa. There is interval  resolution of small amounts CSF collections in the periphery of left frontal and parietal lobes and anterior to the right cerebellum. Basilar cistern is better visualized. Findings suggest possible improvement in the intracranial hypotension. Electronically Signed   By: Elmer Picker M.D.   On: 05/03/2022 18:54    Procedures Procedures    Medications Ordered in ED Medications  sodium chloride 0.9 % bolus 500 mL (has no administration in time range)  HYDROmorphone (DILAUDID) injection 0.5 mg (0.5 mg Intravenous Given 05/03/22 1825)  ondansetron (ZOFRAN) injection 4 mg (4 mg Intravenous Given 05/03/22 1824)    ED Course/ Medical Decision Making/ A&P    Patient seen and examined. History obtained directly from patient. Work-up including labs, imaging, EKG ordered in triage, if performed, were reviewed.    Labs/EKG: Independently reviewed and interpreted.  This included: CBC with elevated white blood cell count of 15.7, minimal anemia 11.9 hemoglobin; CMP with mildly elevated glucose at 123, mildly low potassium at 3.4; pregnancy beta i-STAT hCG 5.5, likely false positive, urine pregnancy requested; UA with some blood otherwise no compelling signs of infection.  Lactate 1.2 >> 0.7.   Imaging: Independently visualized and interpreted.  This included: CT head, radiology suggest findings of improvement in intracranial hypertension.  Medications/Fluids: Dilaudid, Zofran, fluid bolus.  Most recent vital signs reviewed and are as follows: BP 123/80   Pulse 85   Temp 99.1 F (37.3 C) (Oral)   Resp 18   Ht 5\' 6"  (1.676 m)   Wt 94.6 kg   LMP 04/08/2022 (Approximate)   SpO2 95%   BMI 33.66 kg/m   Initial impression: Headache, recent surgery and shunt placement, previous work-up was not suggestive of meningitis and she appears non-toxic today.   I did consult with neurosurgery APP by telephone.  They are aware that patient is in the emergency department, awaiting results of head CT.  It  appears they will be admitting the patient.  Medical Decision Making Amount and/or Complexity of Data Reviewed Labs: ordered. Radiology: ordered.  Risk Prescription drug management.  In regards to the patient's headache, critical differentials were considered including subarachnoid hemorrhage, intracerebral hemorrhage, epidural/subdural hematoma, pituitary apoplexy, vertebral/carotid artery dissection, giant cell arteritis, central venous thrombosis, reversible cerebral vasoconstriction, acute angle closure glaucoma, idiopathic intracranial hypertension, bacterial meningitis, viral encephalitis, carbon monoxide poisoning, posterior reversible encephalopathy syndrome, pre-eclampsia.   Reg flag symptoms related to these causes were considered including systemic symptoms (fever, weight loss), neurologic symptoms (confusion, mental status change, vision change, associated seizure), acute or sudden "thunderclap" onset, patient age 91 or older with new or progressive headache, patient of any age with first headache or change in headache pattern, pregnant or postpartum status, history of HIV or other immunocompromise, history of cancer, headache occurring with exertion, associated neck or shoulder pain, associated traumatic injury, concurrent use of anticoagulation, family history of spontaneous SAH, and concurrent drug use.    Other benign, more common causes of headache were considered including migraine, tension-type headache, cluster headache, referred pain from other cause such as sinus infection, dental pain, trigeminal neuralgia.          Final Clinical Impression(s) / ED Diagnoses Final diagnoses:  Positional headache    Rx / DC Orders ED Discharge Orders     None         Carlisle Cater, PA-C 05/03/22 1911    Regan Lemming, MD 05/03/22 2055

## 2022-05-04 MED ORDER — HYDROCODONE-ACETAMINOPHEN 5-325 MG PO TABS
1.0000 | ORAL_TABLET | Freq: Four times a day (QID) | ORAL | Status: DC | PRN
  Administered 2022-05-04 – 2022-05-06 (×8): 1 via ORAL
  Filled 2022-05-04 (×9): qty 1

## 2022-05-04 MED ORDER — ACETAMINOPHEN 325 MG PO TABS
650.0000 mg | ORAL_TABLET | ORAL | Status: DC | PRN
  Administered 2022-05-04: 650 mg via ORAL
  Filled 2022-05-04: qty 2

## 2022-05-04 MED ORDER — ONDANSETRON HCL 4 MG/2ML IJ SOLN
4.0000 mg | Freq: Four times a day (QID) | INTRAMUSCULAR | Status: DC | PRN
  Administered 2022-05-06: 4 mg via INTRAVENOUS
  Filled 2022-05-04: qty 2

## 2022-05-04 MED ORDER — POTASSIUM CHLORIDE IN NACL 20-0.9 MEQ/L-% IV SOLN
INTRAVENOUS | Status: DC
  Filled 2022-05-04 (×4): qty 1000

## 2022-05-04 MED ORDER — ACETAZOLAMIDE 250 MG PO TABS
250.0000 mg | ORAL_TABLET | Freq: Two times a day (BID) | ORAL | Status: AC
Start: 2022-05-04 — End: 2022-05-04
  Administered 2022-05-04 (×2): 250 mg via ORAL
  Filled 2022-05-04 (×2): qty 1

## 2022-05-04 MED ORDER — SODIUM CHLORIDE 0.9 % IV SOLN
12.5000 mg | Freq: Once | INTRAVENOUS | Status: AC
  Administered 2022-05-04: 12.5 mg via INTRAVENOUS
  Filled 2022-05-04: qty 0.5

## 2022-05-04 MED ORDER — SODIUM CHLORIDE 0.9 % IV SOLN: INTRAVENOUS | Status: DC

## 2022-05-04 MED ORDER — POLYETHYLENE GLYCOL 3350 17 G PO PACK
17.0000 g | PACK | Freq: Once | ORAL | Status: AC
  Administered 2022-05-04: 17 g via ORAL
  Filled 2022-05-04: qty 1

## 2022-05-04 MED ORDER — ONDANSETRON HCL 4 MG PO TABS
4.0000 mg | ORAL_TABLET | Freq: Four times a day (QID) | ORAL | Status: DC | PRN
  Administered 2022-05-04 – 2022-05-06 (×4): 4 mg via ORAL
  Filled 2022-05-04 (×4): qty 1

## 2022-05-04 MED ORDER — DEXAMETHASONE SODIUM PHOSPHATE 4 MG/ML IJ SOLN
4.0000 mg | Freq: Four times a day (QID) | INTRAMUSCULAR | Status: AC
  Administered 2022-05-04 (×4): 4 mg via INTRAVENOUS
  Filled 2022-05-04 (×4): qty 1

## 2022-05-04 NOTE — ED Notes (Signed)
Patient appears to be resting comfortably at this time.

## 2022-05-04 NOTE — ED Notes (Signed)
Secretary asked to page provider regarding the patient's complaint of a HA and head pressure

## 2022-05-04 NOTE — ED Notes (Signed)
Neurosurgery paged at this time due to the patient's need for pain and nausea management

## 2022-05-04 NOTE — Progress Notes (Signed)
Subjective: Patient reports continued headache with some nausea, pressure behind the eyes and ears.  Objective: Vital signs in last 24 hours: Temp:  [98.5 F (36.9 C)-99.1 F (37.3 C)] 98.5 F (36.9 C) (06/01 0306) Pulse Rate:  [57-98] 65 (06/01 0600) Resp:  [13-22] 16 (06/01 0600) BP: (113-140)/(60-102) 116/60 (06/01 0600) SpO2:  [92 %-97 %] 94 % (06/01 0600) Weight:  [94.6 kg] 94.6 kg (05/31 1551)  Intake/Output from previous day: 05/31 0701 - 06/01 0700 In: 600 [IV Piggyback:600] Out: -  Intake/Output this shift: No intake/output data recorded.  Neurologic: Grossly normal, she is awake and alert and conversant and moving everything.  Incisions are clean dry and intact  Lab Results: Lab Results  Component Value Date   WBC 15.7 (H) 05/03/2022   HGB 11.9 (L) 05/03/2022   HCT 36.0 05/03/2022   MCV 90.5 05/03/2022   PLT 308 05/03/2022   No results found for: INR, PROTIME BMET Lab Results  Component Value Date   NA 137 05/03/2022   K 3.4 (L) 05/03/2022   CL 102 05/03/2022   CO2 24 05/03/2022   GLUCOSE 123 (H) 05/03/2022   BUN 8 05/03/2022   CREATININE 0.80 05/03/2022   CALCIUM 8.9 05/03/2022    Studies/Results: CT HEAD WO CONTRAST ( )  Result Date: 05/03/2022 CLINICAL DATA:  Worsening headache, recent occipital craniotomy for treatment of Chiari malformation EXAM: CT HEAD WITHOUT CONTRAST TECHNIQUE: Contiguous axial images were obtained from the base of the skull through the vertex without intravenous contrast. RADIATION DOSE REDUCTION: This exam was performed according to the departmental dose-optimization program which includes automated exposure control, adjustment of the mA and/or kV according to patient size and/or use of iterative reconstruction technique. COMPARISON:  04/27/2022 FINDINGS: Brain: No acute intracranial findings are seen. There is interval resolution of small CSF collection in the periphery of left frontal and parietal lobes. There is interval  clearing of CSF collection anterior to the right cerebellum. Basilar cisterns are better visualized in the current study possibly suggesting improvement in intracranial hypotension. There is midline occipital craniotomy. Low-density seen posterior to the craniotomy site has not changed significantly. Vascular: Unremarkable. Skull: No recent fracture is seen. There is previous occipital craniotomy. Sinuses/Orbits: Unremarkable. Other: None. IMPRESSION: No acute intracranial findings are seen. Postsurgical changes are noted in the posterior cranial fossa. There is interval resolution of small amounts CSF collections in the periphery of left frontal and parietal lobes and anterior to the right cerebellum. Basilar cistern is better visualized. Findings suggest possible improvement in the intracranial hypotension. Electronically Signed   By: Ernie Avena M.D.   On: 05/03/2022 18:54    Assessment/Plan: Continued headache.  I suspect based on her CT scan she is under drained at this point and we lowered her valve pressure to 1.5.  She is on Decadron and Diamox.  Cannot rule out other etiologies such as meningitis or postop infection or even over drainage, but I think these are less likely  Continue symptomatic treatment.  She may feel better when she is mobilized and up and around versus lying flat  Estimated body mass index is 33.66 kg/m as calculated from the following:   Height as of this encounter: 5\' 6"  (1.676 m).   Weight as of this encounter: 94.6 kg.    LOS: 0 days    05/04/2022, 7:48 AM

## 2022-05-04 NOTE — ED Notes (Signed)
Pt ambulated to bathroom without complications.

## 2022-05-04 NOTE — Plan of Care (Signed)

## 2022-05-04 NOTE — ED Notes (Signed)
Neurosurgeon states via phone call with this RN that she understands the patient is still having a Headache and nausea and reports to give the ordered tylenol and Zofran.

## 2022-05-04 NOTE — TOC Initial Note (Signed)
Transition of Care [facility]) - Initial/Assessment Note    Patient Details  Name: Sabrina Diaz MRN: [id number] Date of Birth: 31-Oct-1996  Transition of [facility]) CM/SW Contact:    Durenda Guthrie, RN Phone Number: 05/04/2022, 2:37 PM  Clinical Narrative:                 Transition of Care Screening note:  Transition of Care Department [facility]) has reviewed patient and no TOC needs have been identified at this time. We will continue to monitor patient advancement through Interdisciplinary progressions. If new patient transition needs arise, please place a consult.          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) 04/30/2022   CSF pleocytosis    Headache 04/28/2022   Chiari malformation type I (HCC) 04/03/2022   Chiari I malformation (HCC) 09/29/2021   PCP:  Nino Glow, PA-C Pharmacy:   [facility] [address], [place] - [address] [place] [address] Phone: [phone] Fax: [phone]  [id number] [place], [place] - [address] [place] [address] Phone: [phone] Fax: [phone]     Social Determinants of Health (SDOH) Interventions    Readmission Risk Interventions     View : No data to display.

## 2022-05-05 ENCOUNTER — Inpatient Hospital Stay (HOSPITAL_COMMUNITY): Payer: 59

## 2022-05-05 DIAGNOSIS — Z79891 Long term (current) use of opiate analgesic: Secondary | ICD-10-CM | POA: Diagnosis not present

## 2022-05-05 DIAGNOSIS — Z982 Presence of cerebrospinal fluid drainage device: Secondary | ICD-10-CM | POA: Diagnosis not present

## 2022-05-05 DIAGNOSIS — Z87891 Personal history of nicotine dependence: Secondary | ICD-10-CM | POA: Diagnosis not present

## 2022-05-05 DIAGNOSIS — R519 Headache, unspecified: Secondary | ICD-10-CM | POA: Diagnosis present

## 2022-05-05 DIAGNOSIS — Z79899 Other long term (current) drug therapy: Secondary | ICD-10-CM | POA: Diagnosis not present

## 2022-05-05 LAB — CBC
HCT: 37.1 % (ref 36.0–46.0)
Hemoglobin: 12.1 g/dL (ref 12.0–15.0)
MCH: 28.9 pg (ref 26.0–34.0)
MCHC: 32.6 g/dL (ref 30.0–36.0)
MCV: 88.8 fL (ref 80.0–100.0)
Platelets: 409 10*3/uL — ABNORMAL HIGH (ref 150–400)
RBC: 4.18 MIL/uL (ref 3.87–5.11)
RDW: 13.3 % (ref 11.5–15.5)
WBC: 19.5 10*3/uL — ABNORMAL HIGH (ref 4.0–10.5)
nRBC: 0 % (ref 0.0–0.2)

## 2022-05-05 LAB — HCG, QUANTITATIVE, PREGNANCY: hCG, Beta Chain, Quant, S: <1 m[IU]/mL (ref <5)

## 2022-05-05 LAB — OCCULT BLOOD X 1 CARD TO LAB, STOOL: Fecal Occult Bld: NEGATIVE

## 2022-05-05 MED ORDER — IOHEXOL 9 MG/ML PO SOLN
500.0000 mL | ORAL | Status: AC
  Administered 2022-05-05 (×2): 500 mL via ORAL

## 2022-05-05 NOTE — Progress Notes (Signed)
Subjective: Patient reports bloody stool overnight that was bright red and right lower quadrant abdominal pain. Headaches are actually better  Objective: Vital signs in last 24 hours: Temp:  [97.5 F (36.4 C)-98.6 F (37 C)] 97.9 F (36.6 C) (06/02 0735) Pulse Rate:  [56-86] 72 (06/02 0735) Resp:  [16-18] 16 (06/02 0735) BP: (99-126)/(62-89) 125/89 (06/02 0735) SpO2:  [94 %-99 %] 99 % (06/02 0735)  Intake/Output from previous day: 06/01 0701 - 06/02 0700 In: 2029.8 [I.V.:2029.8] Out: 1 [Urine:1] Intake/Output this shift: No intake/output data recorded.  Neurologic: Grossly normal  Lab Results: Lab Results  Component Value Date   WBC 15.7 (H) 05/03/2022   HGB 11.9 (L) 05/03/2022   HCT 36.0 05/03/2022   MCV 90.5 05/03/2022   PLT 308 05/03/2022   No results found for: INR, PROTIME BMET Lab Results  Component Value Date   NA 137 05/03/2022   K 3.4 (L) 05/03/2022   CL 102 05/03/2022   CO2 24 05/03/2022   GLUCOSE 123 (H) 05/03/2022   BUN 8 05/03/2022   CREATININE 0.80 05/03/2022   CALCIUM 8.9 05/03/2022    Studies/Results: CT HEAD WO CONTRAST ( )  Result Date: 05/03/2022 CLINICAL DATA:  Worsening headache, recent occipital craniotomy for treatment of Chiari malformation EXAM: CT HEAD WITHOUT CONTRAST TECHNIQUE: Contiguous axial images were obtained from the base of the skull through the vertex without intravenous contrast. RADIATION DOSE REDUCTION: This exam was performed according to the departmental dose-optimization program which includes automated exposure control, adjustment of the mA and/or kV according to patient size and/or use of iterative reconstruction technique. COMPARISON:  04/27/2022 FINDINGS: Brain: No acute intracranial findings are seen. There is interval resolution of small CSF collection in the periphery of left frontal and parietal lobes. There is interval clearing of CSF collection anterior to the right cerebellum. Basilar cisterns are better  visualized in the current study possibly suggesting improvement in intracranial hypotension. There is midline occipital craniotomy. Low-density seen posterior to the craniotomy site has not changed significantly. Vascular: Unremarkable. Skull: No recent fracture is seen. There is previous occipital craniotomy. Sinuses/Orbits: Unremarkable. Other: None. IMPRESSION: No acute intracranial findings are seen. Postsurgical changes are noted in the posterior cranial fossa. There is interval resolution of small amounts CSF collections in the periphery of left frontal and parietal lobes and anterior to the right cerebellum. Basilar cistern is better visualized. Findings suggest possible improvement in the intracranial hypotension. Electronically Signed   By: Ernie Avena M.D.   On: 05/03/2022 18:54    Assessment/Plan: S/p LP shunt and chiari decompression. HA's are improving but complaining of abdominal pain and blood in stool. Will get Ct abd/pelvis and hemoccult stool sample. Pregnancy test was positive but last menstrual period was 4 days ago. Will repeat this as well.    LOS: 0 days    Sabrina Diaz 05/05/2022, 9:03 AM

## 2022-05-05 NOTE — Progress Notes (Addendum)
Patient reports bright red blood in her stool.  Upon assessment, noted small amount of bright red substance in top hat.  Patient assures it is from the rectum.  Patient continues to c/o constipation.

## 2022-05-06 MED ORDER — OXYCODONE-ACETAMINOPHEN 5-325 MG PO TABS: 1.0000 | ORAL_TABLET | ORAL | 0 refills | Status: DC | PRN

## 2022-05-06 NOTE — Progress Notes (Signed)
Discharge instructions given. Patient verbalized understanding and all questions were answered.  ?

## 2022-05-06 NOTE — Discharge Summary (Signed)
Physician Discharge Summary  Patient ID: Sabrina Diaz MRN: [id number] DOB/AGE: 1996-01-12 26 y.o.  Admit date: 05/03/2022 Discharge date: 05/06/2022  Admission Diagnoses: s/p LP shunt with uncontrolled headaches    Discharge Diagnoses: same   Discharged Condition: good  Hospital Course: The patient was admitted on 05/03/2022 for pain control.  The hospital course was routine. There were no complications. The wound remained clean dry and intact. Pt had appropriate head soreness. Her shunt was reprogrammed to 1.5 as opposed to 2.0. Her ct abdomen and pelvis were unremarkable as was her hemoccult stool. The patient remained afebrile with stable vital signs, and tolerated a regular diet. The patient continued to increase activities, and pain was well controlled with oral pain medications.   Consults: None  Significant Diagnostic Studies:  Results for orders placed or performed during the hospital encounter of 05/03/22  Lactic acid, plasma  Result Value Ref Range   Lactic Acid, Venous 0.7 0.5 - 1.9 mmol/L  Lactic acid, plasma  Result Value Ref Range   Lactic Acid, Venous 1.2 0.5 - 1.9 mmol/L  Comprehensive metabolic panel  Result Value Ref Range   Sodium 137 135 - 145 mmol/L   Potassium 3.4 (L) 3.5 - 5.1 mmol/L   Chloride 102 98 - 111 mmol/L   CO2 24 22 - 32 mmol/L   Glucose, Bld 123 (H) 70 - 99 mg/dL   BUN 8 6 - 20 mg/dL   Creatinine, Ser 0.80 0.44 - 1.00 mg/dL   Calcium 8.9 8.9 - 10.3 mg/dL   Total Protein 6.9 6.5 - 8.1 g/dL   Albumin 3.5 3.5 - 5.0 g/dL   AST 14 (L) 15 - 41 U/L   ALT 31 0 - 44 U/L   Alkaline Phosphatase 74 38 - 126 U/L   Total Bilirubin 0.5 0.3 - 1.2 mg/dL   GFR, Estimated >60 >60 mL/min   Anion gap 11 5 - 15  CBC with Differential  Result Value Ref Range   WBC 15.7 (H) 4.0 - 10.5 K/uL   RBC 3.98 3.87 - 5.11 MIL/uL   Hemoglobin 11.9 (L) 12.0 - 15.0 g/dL   HCT 36.0 36.0 - 46.0 %   MCV 90.5 80.0 - 100.0 fL   MCH 29.9 26.0 - 34.0 pg   MCHC 33.1 30.0 - 36.0  g/dL   RDW 13.3 11.5 - 15.5 %   Platelets 308 150 - 400 K/uL   nRBC 0.0 0.0 - 0.2 %   Neutrophils Relative % 81 %   Neutro Abs 12.6 (H) 1.7 - 7.7 K/uL   Lymphocytes Relative 12 %   Lymphs Abs 1.8 0.7 - 4.0 K/uL   Monocytes Relative 6 %   Monocytes Absolute 1.0 0.1 - 1.0 K/uL   Eosinophils Relative 1 %   Eosinophils Absolute 0.2 0.0 - 0.5 K/uL   Basophils Relative 0 %   Basophils Absolute 0.0 0.0 - 0.1 K/uL   Immature Granulocytes 0 %   Abs Immature Granulocytes 0.07 0.00 - 0.07 K/uL  Urinalysis, Routine w reflex microscopic  Result Value Ref Range   Color, Urine YELLOW YELLOW   APPearance HAZY (A) CLEAR   Specific Gravity, Urine 1.018 1.005 - 1.030   pH 6.0 5.0 - 8.0   Glucose, UA NEGATIVE NEGATIVE mg/dL   Hgb urine dipstick LARGE (A) NEGATIVE   Bilirubin Urine NEGATIVE NEGATIVE   Ketones, ur 5 (A) NEGATIVE mg/dL   Protein, ur NEGATIVE NEGATIVE mg/dL   Nitrite NEGATIVE NEGATIVE   Leukocytes,Ua NEGATIVE NEGATIVE  RBC / HPF 11-20 0 - 5 RBC/hpf   WBC, UA 0-5 0 - 5 WBC/hpf   Bacteria, UA RARE (A) NONE SEEN   Squamous Epithelial / LPF 0-5 0 - 5   Mucus PRESENT   hCG, quantitative, pregnancy  Result Value Ref Range   hCG, Beta Chain, Quant, S <1 <5 mIU/mL  Occult blood card to lab, stool  Result Value Ref Range   Fecal Occult Bld NEGATIVE NEGATIVE  CBC  Result Value Ref Range   WBC 19.5 (H) 4.0 - 10.5 K/uL   RBC 4.18 3.87 - 5.11 MIL/uL   Hemoglobin 12.1 12.0 - 15.0 g/dL   HCT 37.1 36.0 - 46.0 %   MCV 88.8 80.0 - 100.0 fL   MCH 28.9 26.0 - 34.0 pg   MCHC 32.6 30.0 - 36.0 g/dL   RDW 13.3 11.5 - 15.5 %   Platelets 409 (H) 150 - 400 K/uL   nRBC 0.0 0.0 - 0.2 %  I-Stat beta hCG blood, ED  Result Value Ref Range   I-stat hCG, quantitative 5.5 (H) <5 mIU/mL   Comment 3            CT ABDOMEN PELVIS WO CONTRAST  Result Date: 05/05/2022 CLINICAL DATA:  Acute generalized abdominal pain. EXAM: CT ABDOMEN AND PELVIS WITHOUT CONTRAST TECHNIQUE: Multidetector CT imaging of the  abdomen and pelvis was performed following the standard protocol without IV contrast. RADIATION DOSE REDUCTION: This exam was performed according to the departmental dose-optimization program which includes automated exposure control, adjustment of the mA and/or kV according to patient size and/or use of iterative reconstruction technique. COMPARISON:  None Available. FINDINGS: Lower chest: No acute abnormality. Hepatobiliary: No focal liver abnormality is seen. No gallstones, gallbladder wall thickening, or biliary dilatation. Pancreas: Unremarkable. No pancreatic ductal dilatation or surrounding inflammatory changes. Spleen: Normal in size without focal abnormality. Adrenals/Urinary Tract: Adrenal glands are unremarkable. Small nonobstructive left renal calculus. No hydronephrosis or renal obstruction is noted. Bladder is unremarkable. Stomach/Bowel: Stomach is within normal limits. Appendix appears normal. No evidence of bowel wall thickening, distention, or inflammatory changes. Vascular/Lymphatic: No significant vascular findings are present. No enlarged abdominal or pelvic lymph nodes. Reproductive: Uterus and bilateral adnexa are unremarkable. Other: Spinal shunt is noted entering the lower abdomen with distal tip seen in the right-sided the abdomen. No free fluid is noted. No hernia is noted. Musculoskeletal: No acute or significant osseous findings. IMPRESSION: Small nonobstructive left renal calculus. No hydronephrosis or renal obstruction is noted. Distal tip of spinal peritoneal shunt is noted in the right side of the abdomen. Electronically Signed   By: Marijo Conception M.D.   On: 05/05/2022 14:50   DG Lumbar Spine 2-3 Views  Result Date: 04/29/2022 CLINICAL DATA:  Portable lateral view of the lumbar spine for lumbo peritoneal shunt placement. EXAM: LUMBAR SPINE - 2-3 VIEW Fluoro time: 25 seconds.  Dose: 16.27 mGy.  Views: 2. COMPARISON:  Lumbar MRI, 10/19/2021. FINDINGS: Lateral view shows placement  of a catheter or dilator with its tip projecting posterior to the upper endplate of L4. IMPRESSION: Portable imaging provided for lumbo peritoneal shunt placement. Electronically Signed   By: Lajean Manes M.D.   On: 04/29/2022 18:40   CT HEAD WO CONTRAST (5MM)  Result Date: 05/03/2022 CLINICAL DATA:  Worsening headache, recent occipital craniotomy for treatment of Chiari malformation EXAM: CT HEAD WITHOUT CONTRAST TECHNIQUE: Contiguous axial images were obtained from the base of the skull through the vertex without intravenous contrast. RADIATION DOSE  REDUCTION: This exam was performed according to the departmental dose-optimization program which includes automated exposure control, adjustment of the mA and/or kV according to patient size and/or use of iterative reconstruction technique. COMPARISON:  04/27/2022 FINDINGS: Brain: No acute intracranial findings are seen. There is interval resolution of small CSF collection in the periphery of left frontal and parietal lobes. There is interval clearing of CSF collection anterior to the right cerebellum. Basilar cisterns are better visualized in the current study possibly suggesting improvement in intracranial hypotension. There is midline occipital craniotomy. Low-density seen posterior to the craniotomy site has not changed significantly. Vascular: Unremarkable. Skull: No recent fracture is seen. There is previous occipital craniotomy. Sinuses/Orbits: Unremarkable. Other: None. IMPRESSION: No acute intracranial findings are seen. Postsurgical changes are noted in the posterior cranial fossa. There is interval resolution of small amounts CSF collections in the periphery of left frontal and parietal lobes and anterior to the right cerebellum. Basilar cistern is better visualized. Findings suggest possible improvement in the intracranial hypotension. Electronically Signed   By: Elmer Picker M.D.   On: 05/03/2022 18:54   CT Head Wo Contrast  Result Date:  04/27/2022 CLINICAL DATA:  Chiari decompression 04/03/2022. Headache. Dizziness EXAM: CT HEAD WITHOUT CONTRAST CT CERVICAL SPINE WITHOUT CONTRAST TECHNIQUE: Multidetector CT imaging of the head and cervical spine was performed following the standard protocol without intravenous contrast. Multiplanar CT image reconstructions of the cervical spine were also generated. RADIATION DOSE REDUCTION: This exam was performed according to the departmental dose-optimization program which includes automated exposure control, adjustment of the mA and/or kV according to patient size and/or use of iterative reconstruction technique. COMPARISON:  CT head 04/05/2022 FINDINGS: CT HEAD FINDINGS Brain: Postop suboccipital craniectomy for Chiari decompression. Suboccipital fluid collection has increased in size now measuring 39 x 50 mm on axial images. Low lying cerebellar tonsils unchanged. Ventricles are small in size, similar to the prior study. Interval development of low-density subdural fluid collection in the left hemisphere anteriorly and along the right tentorium. Small low-density fluid collection around the cerebellum bilaterally right greater than left. These are new findings since the prior study. No acute hemorrhage. There is crowding of the structures in the posterior fossa which has progressed from the preoperative MRI. This suggests intracranial hypotension from CSF leak. No acute infarct or mass. Vascular: Negative for hyperdense vessel Skull: Suboccipital craniectomy. Sinuses/Orbits: Paranasal sinuses clear. Small effusion right mastoid tip. Left mastoid clear. Negative orbit Other: None CT CERVICAL SPINE FINDINGS Alignment: Normal Skull base and vertebrae: Suboccipital craniectomy. Incomplete posterior arch of C1, probably postsurgical. No cervical spine fracture. Soft tissues and spinal canal: Suboccipital fluid collection measuring 52 x 41 x 59 mm. This extends to the C4 level. Disc levels: Disc space height normal.  No disc protrusion or spinal stenosis. Upper chest: Lung apices clear bilaterally. Other: None IMPRESSION: 1. Postop suboccipital craniectomy for Chiari decompression. Enlarging suboccipital fluid collection which may be due to CSF leak. 2. Interval development of low-density subdural fluid collections left frontal region, right tentorium and bilateral posterior fossa, likely due to intracranial hypotension. Basal cisterns are effaced due to intracranial hypotension. 3. No acute infarct or hemorrhage 4. No acute abnormality in the cervical spine. Electronically Signed   By: Franchot Gallo M.D.   On: 04/27/2022 20:09   CT Cervical Spine Wo Contrast  Result Date: 04/27/2022 CLINICAL DATA:  Chiari decompression 04/03/2022. Headache. Dizziness EXAM: CT HEAD WITHOUT CONTRAST CT CERVICAL SPINE WITHOUT CONTRAST TECHNIQUE: Multidetector CT imaging of the head and cervical  spine was performed following the standard protocol without intravenous contrast. Multiplanar CT image reconstructions of the cervical spine were also generated. RADIATION DOSE REDUCTION: This exam was performed according to the departmental dose-optimization program which includes automated exposure control, adjustment of the mA and/or kV according to patient size and/or use of iterative reconstruction technique. COMPARISON:  CT head 04/05/2022 FINDINGS: CT HEAD FINDINGS Brain: Postop suboccipital craniectomy for Chiari decompression. Suboccipital fluid collection has increased in size now measuring 39 x 50 mm on axial images. Low lying cerebellar tonsils unchanged. Ventricles are small in size, similar to the prior study. Interval development of low-density subdural fluid collection in the left hemisphere anteriorly and along the right tentorium. Small low-density fluid collection around the cerebellum bilaterally right greater than left. These are new findings since the prior study. No acute hemorrhage. There is crowding of the structures in the  posterior fossa which has progressed from the preoperative MRI. This suggests intracranial hypotension from CSF leak. No acute infarct or mass. Vascular: Negative for hyperdense vessel Skull: Suboccipital craniectomy. Sinuses/Orbits: Paranasal sinuses clear. Small effusion right mastoid tip. Left mastoid clear. Negative orbit Other: None CT CERVICAL SPINE FINDINGS Alignment: Normal Skull base and vertebrae: Suboccipital craniectomy. Incomplete posterior arch of C1, probably postsurgical. No cervical spine fracture. Soft tissues and spinal canal: Suboccipital fluid collection measuring 52 x 41 x 59 mm. This extends to the C4 level. Disc levels: Disc space height normal. No disc protrusion or spinal stenosis. Upper chest: Lung apices clear bilaterally. Other: None IMPRESSION: 1. Postop suboccipital craniectomy for Chiari decompression. Enlarging suboccipital fluid collection which may be due to CSF leak. 2. Interval development of low-density subdural fluid collections left frontal region, right tentorium and bilateral posterior fossa, likely due to intracranial hypotension. Basal cisterns are effaced due to intracranial hypotension. 3. No acute infarct or hemorrhage 4. No acute abnormality in the cervical spine. Electronically Signed   By: Franchot Gallo M.D.   On: 04/27/2022 20:09   DG Abd 2 Views  Result Date: 04/30/2022 CLINICAL DATA:  Postop.  History of Chiari malformation. EXAM: ABDOMEN - 2 VIEW COMPARISON:  None Available. FINDINGS: There is a lumbar shunt catheter which enters the canal at the left L3-4 neuro foramina. The shunt catheter terminates at the approximate T12-L1 level. The distal portion of the shunt catheter terminates within the ventral abdomen at the approximate L3-4 level. Bowel gas pattern appears nonobstructed. Moderate stool burden noted within the colon. IMPRESSION: Anterior lumbar shunt catheter is identified which enters the canal the of the left L3-4 neural foramina. Electronically  Signed   By: Kerby Moors M.D.   On: 04/30/2022 10:25   DG C-Arm 1-60 Min-No Report  Result Date: 04/29/2022 Fluoroscopy was utilized by the requesting physician.  No radiographic interpretation.   DG C-Arm 1-60 Min-No Report  Result Date: 04/29/2022 Fluoroscopy was utilized by the requesting physician.  No radiographic interpretation.   DG C-Arm 1-60 Min-No Report  Result Date: 04/29/2022 Fluoroscopy was utilized by the requesting physician.  No radiographic interpretation.   DG FL GUIDED LUMBAR PUNCTURE  Result Date: 04/28/2022 CLINICAL DATA:  Recent history of Chiari decompression. Pseudomeningocele, headaches. Request for diagnostic lumbar puncture. EXAM: DIAGNOSTIC LUMBAR PUNCTURE UNDER FLUOROSCOPIC GUIDANCE COMPARISON:  None Available. FLUOROSCOPY: Radiation Exposure Index (as provided by the fluoroscopic device): 1.50 mGy Kerma PROCEDURE: Informed consent was obtained from the patient prior to the procedure, including potential complications of headache, allergy, and pain. With the patient prone, the lower back was prepped with Betadine.  1% Lidocaine was used for local anesthesia. Lumbar puncture was performed at the L2-L3 level using a 20 gauge needle with return of clear CSF with an opening pressure of 29 cm water. Approximately 10 ml of CSF were obtained for laboratory studies. Closing pressure measured at 20 cm water. The patient tolerated the procedure well and there were no apparent complications. IMPRESSION: Successful image guided lumbar puncture at L2-L3 level as described above. Performed by: Ascencion Dike PA-C, supervised by Dr. Macy Mis Electronically Signed   By: Macy Mis M.D.   On: 04/28/2022 14:43    Antibiotics:  Anti-infectives (From admission, onward)    None       Discharge Exam: Blood pressure 107/69, pulse 60, temperature 98.9 F (37.2 C), temperature source Oral, resp. rate 17, height 5\' 6"  (1.676 m), weight 94.6 kg, last menstrual period  04/08/2022, SpO2 97 %. Neurologic: Grossly normal Ambulating and voiding well controlled.   Discharge Medications:   Allergies as of 05/06/2022   No Known Allergies      Medication List     STOP taking these medications    polyethylene glycol 17 g packet Commonly known as: MIRALAX / GLYCOLAX   rizatriptan 10 MG tablet Commonly known as: Maxalt       TAKE these medications    acetaminophen 500 MG tablet Commonly known as: TYLENOL Take 1,000 mg by mouth every 6 (six) hours as needed for moderate pain or headache.   ibuprofen 200 MG tablet Commonly known as: ADVIL Take 800 mg by mouth every 6 (six) hours as needed for moderate pain or headache.   methocarbamol 500 MG tablet Commonly known as: ROBAXIN Take 1 tablet (500 mg total) by mouth every 6 (six) hours as needed for up to 14 days for muscle spasms.   oxyCODONE 5 MG immediate release tablet Commonly known as: Oxy IR/ROXICODONE Take 1 tablet (5 mg total) by mouth every 4 (four) hours as needed for up to 7 days for moderate pain.   oxyCODONE-acetaminophen 5-325 MG tablet Commonly known as: Percocet Take 1 tablet by mouth every 4 (four) hours as needed for severe pain.   senna-docusate 8.6-50 MG tablet Commonly known as: Senokot-S Take 1 tablet by mouth 2 (two) times daily for 14 days.   traZODone 100 MG tablet Commonly known as: DESYREL Take 1 tablet (100 mg total) by mouth at bedtime.   zonisamide 100 MG capsule Commonly known as: ZONEGRAN Take 2 capsules (200 mg total) by mouth daily. Take 100 mg (1 pill) daily for two weeks, then increase to 200 mg (2 pills) daily. What changed:  how much to take when to take this additional instructions        Disposition: home  Final Dx: headaches after LP shutn   Discharge Instructions     Call MD for:  difficulty breathing, headache or visual disturbances   Complete by: As directed    Call MD for:  hives   Complete by: As directed    Call MD for:   persistant dizziness or light-headedness   Complete by: As directed    Call MD for:  persistant nausea and vomiting   Complete by: As directed    Call MD for:  redness, tenderness, or signs of infection (pain, swelling, redness, odor or green/yellow discharge around incision site)   Complete by: As directed    Call MD for:  severe uncontrolled pain   Complete by: As directed    Call MD for:  temperature >100.4  Complete by: As directed    Diet - low sodium heart healthy   Complete by: As directed    Increase activity slowly   Complete by: As directed    No wound care   Complete by: As directed           Signed: [name] Graceyn Fodor 05/06/2022, 8:37 AM

## 2022-06-05 ENCOUNTER — Inpatient Hospital Stay (HOSPITAL_COMMUNITY)
Admission: EM | Admit: 2022-06-05 | Discharge: 2022-06-16 | DRG: 030 | Disposition: A | Discharge status: Home / Self Care | Payer: 59 | Attending: Neurological Surgery | Admitting: Neurological Surgery

## 2022-06-05 ENCOUNTER — Inpatient Hospital Stay: Admit: 2022-06-05 | Payer: 59 | Admitting: Neurological Surgery

## 2022-06-05 ENCOUNTER — Encounter (HOSPITAL_COMMUNITY): Payer: Self-pay

## 2022-06-05 DIAGNOSIS — G932 Benign intracranial hypertension: Secondary | ICD-10-CM | POA: Diagnosis present

## 2022-06-05 DIAGNOSIS — R000 Tachycardia, unspecified: Secondary | ICD-10-CM | POA: Diagnosis not present

## 2022-06-05 DIAGNOSIS — D72829 Elevated white blood cell count, unspecified: Secondary | ICD-10-CM | POA: Diagnosis present

## 2022-06-05 DIAGNOSIS — H547 Unspecified visual loss: Secondary | ICD-10-CM | POA: Diagnosis present

## 2022-06-05 DIAGNOSIS — Z982 Presence of cerebrospinal fluid drainage device: Secondary | ICD-10-CM | POA: Diagnosis not present

## 2022-06-05 DIAGNOSIS — R404 Transient alteration of awareness: Secondary | ICD-10-CM

## 2022-06-05 DIAGNOSIS — H3562 Retinal hemorrhage, left eye: Secondary | ICD-10-CM | POA: Diagnosis present

## 2022-06-05 DIAGNOSIS — R519 Headache, unspecified: Secondary | ICD-10-CM

## 2022-06-05 DIAGNOSIS — H4312 Vitreous hemorrhage, left eye: Secondary | ICD-10-CM | POA: Diagnosis present

## 2022-06-05 DIAGNOSIS — R4189 Other symptoms and signs involving cognitive functions and awareness: Secondary | ICD-10-CM

## 2022-06-05 DIAGNOSIS — Z79899 Other long term (current) drug therapy: Secondary | ICD-10-CM

## 2022-06-05 DIAGNOSIS — Z87891 Personal history of nicotine dependence: Secondary | ICD-10-CM | POA: Diagnosis not present

## 2022-06-05 DIAGNOSIS — G9783 Intracranial hypotension following lumbar cerebrospinal fluid shunting: Principal | ICD-10-CM

## 2022-06-05 DIAGNOSIS — G96198 Other disorders of meninges, not elsewhere classified: Secondary | ICD-10-CM | POA: Diagnosis present

## 2022-06-05 DIAGNOSIS — G919 Hydrocephalus, unspecified: Principal | ICD-10-CM | POA: Diagnosis present

## 2022-06-05 HISTORY — DX: Anxiety disorder, unspecified: F41.9

## 2022-06-05 LAB — CBC
HCT: 36.5 % (ref 36.0–46.0)
Hemoglobin: 12.3 g/dL (ref 12.0–15.0)
MCH: 29.8 pg (ref 26.0–34.0)
MCHC: 33.7 g/dL (ref 30.0–36.0)
MCV: 88.4 fL (ref 80.0–100.0)
Platelets: 271 10*3/uL (ref 150–400)
RBC: 4.13 MIL/uL (ref 3.87–5.11)
RDW: 13.2 % (ref 11.5–15.5)
WBC: 15.7 10*3/uL — ABNORMAL HIGH (ref 4.0–10.5)
nRBC: 0 % (ref 0.0–0.2)

## 2022-06-05 LAB — BASIC METABOLIC PANEL
Anion gap: 11 (ref 5–15)
BUN: 9 mg/dL (ref 6–20)
CO2: 22 mmol/L (ref 22–32)
Calcium: 8.7 mg/dL — ABNORMAL LOW (ref 8.9–10.3)
Chloride: 107 mmol/L (ref 98–111)
Creatinine, Ser: 0.7 mg/dL (ref 0.44–1.00)
GFR, Estimated: >60 mL/min (ref >60)
Glucose, Bld: 135 mg/dL — ABNORMAL HIGH (ref 70–99)
Potassium: 3.8 mmol/L (ref 3.5–5.1)
Sodium: 140 mmol/L (ref 135–145)

## 2022-06-05 MED ORDER — ZONISAMIDE 100 MG PO CAPS
100.0000 mg | ORAL_CAPSULE | Freq: Two times a day (BID) | ORAL | Status: DC
  Administered 2022-06-05 – 2022-06-15 (×21): 100 mg via ORAL
  Filled 2022-06-05 (×23): qty 1

## 2022-06-05 MED ORDER — OXYCODONE-ACETAMINOPHEN 5-325 MG PO TABS
1.0000 | ORAL_TABLET | ORAL | Status: DC | PRN
  Administered 2022-06-05 – 2022-06-16 (×39): 1 via ORAL
  Filled 2022-06-05 (×38): qty 1

## 2022-06-05 MED ORDER — DEXAMETHASONE 4 MG PO TABS
4.0000 mg | ORAL_TABLET | Freq: Four times a day (QID) | ORAL | Status: DC
  Administered 2022-06-06 (×3): 4 mg via ORAL
  Filled 2022-06-05 (×5): qty 1

## 2022-06-05 MED ORDER — PANTOPRAZOLE SODIUM 40 MG IV SOLR
40.0000 mg | Freq: Every day | INTRAVENOUS | Status: DC
  Administered 2022-06-05: 40 mg via INTRAVENOUS
  Filled 2022-06-05: qty 10

## 2022-06-05 MED ORDER — ONDANSETRON HCL 4 MG PO TABS
4.0000 mg | ORAL_TABLET | Freq: Four times a day (QID) | ORAL | Status: DC | PRN
Start: 2022-06-05 — End: 2022-06-16
  Administered 2022-06-12 – 2022-06-15 (×2): 4 mg via ORAL
  Filled 2022-06-05 (×2): qty 1

## 2022-06-05 MED ORDER — SENNA 8.6 MG PO TABS
1.0000 | ORAL_TABLET | Freq: Two times a day (BID) | ORAL | Status: DC
  Administered 2022-06-05 – 2022-06-15 (×20): 8.6 mg via ORAL
  Filled 2022-06-05 (×20): qty 1

## 2022-06-05 MED ORDER — KETOROLAC TROMETHAMINE 15 MG/ML IJ SOLN
15.0000 mg | Freq: Four times a day (QID) | INTRAMUSCULAR | Status: AC
  Administered 2022-06-06 (×3): 15 mg via INTRAVENOUS
  Filled 2022-06-05 (×4): qty 1

## 2022-06-05 MED ORDER — ACETAMINOPHEN 500 MG PO TABS
1000.0000 mg | ORAL_TABLET | Freq: Four times a day (QID) | ORAL | Status: AC
  Administered 2022-06-06 (×3): 1000 mg via ORAL
  Filled 2022-06-05 (×4): qty 2

## 2022-06-05 MED ORDER — ALUM & MAG HYDROXIDE-SIMETH 200-200-20 MG/5ML PO SUSP
30.0000 mL | Freq: Four times a day (QID) | ORAL | Status: DC | PRN
  Administered 2022-06-07 – 2022-06-10 (×2): 30 mL via ORAL
  Filled 2022-06-05 (×2): qty 30

## 2022-06-05 MED ORDER — ONDANSETRON HCL 4 MG/2ML IJ SOLN
4.0000 mg | Freq: Four times a day (QID) | INTRAMUSCULAR | Status: DC | PRN
  Administered 2022-06-11 – 2022-06-15 (×6): 4 mg via INTRAVENOUS
  Filled 2022-06-05 (×6): qty 2

## 2022-06-05 MED ORDER — POTASSIUM CHLORIDE IN NACL 20-0.9 MEQ/L-% IV SOLN
INTRAVENOUS | Status: DC
  Filled 2022-06-05 (×9): qty 1000

## 2022-06-05 MED ORDER — DEXAMETHASONE SODIUM PHOSPHATE 4 MG/ML IJ SOLN
4.0000 mg | Freq: Four times a day (QID) | INTRAMUSCULAR | Status: DC
  Administered 2022-06-06 – 2022-06-07 (×3): 4 mg via INTRAVENOUS
  Filled 2022-06-05 (×5): qty 1

## 2022-06-05 MED ORDER — TRAZODONE HCL 100 MG PO TABS
100.0000 mg | ORAL_TABLET | Freq: Every day | ORAL | Status: DC
  Administered 2022-06-05 – 2022-06-15 (×11): 100 mg via ORAL
  Filled 2022-06-05 (×11): qty 1

## 2022-06-05 MED ORDER — SODIUM CHLORIDE 0.9% FLUSH
3.0000 mL | Freq: Two times a day (BID) | INTRAVENOUS | Status: DC
  Administered 2022-06-05 – 2022-06-14 (×9): 3 mL via INTRAVENOUS

## 2022-06-05 MED ORDER — SODIUM CHLORIDE 0.9 % IV SOLN
250.0000 mL | INTRAVENOUS | Status: DC
  Administered 2022-06-13: 1000 mL via INTRAVENOUS

## 2022-06-05 MED ORDER — SODIUM CHLORIDE 0.9% FLUSH
3.0000 mL | INTRAVENOUS | Status: DC | PRN
Start: 2022-06-05 — End: 2022-06-16

## 2022-06-05 NOTE — ED Triage Notes (Signed)
Pt BIB as transfer from [facility]. Pt w/ hx of chiari malformation, had shunt placed on 5/21- has been having shunt adjusted for high/low pressures. Husband found pt borderline unresponsive this morning and was brought to ED in [place]. Per EMS, pt w/ low pressures via shunt this morning. Pt rec'd fentanyl in transit. Pt w/ 40fr foley in place.

## 2022-06-05 NOTE — H&P (Addendum)
Sabrina Diaz is an 26 y.o. female.   HPI:  26 year old female presented to [facility] after she states her husband found her down, not breathing, lips blue, and unresponsive. Unknown for how long this happened. She was taken to [facility] where they did a head CT. She had a chiari decompression by Dr. Dolphus Jenny and then had an LP shunt placement in May. She was admitted in May by Dr. Yetta Barre and I with severe headaches and we adjusted her shunt to 1.5. She states that she saw Dr. Jake Samples in the office this last week and her shunt was placed at 1.5. Today she complains of severe headaches especially with movement. Denies any NV dizziness or vision changes. She was transferred to [facility] via helicopter.   Past Medical History:  Diagnosis Date   Chiari malformation type I (HCC)    Chronic headaches     Past Surgical History:  Procedure Laterality Date   SUBOCCIPITAL CRANIECTOMY CERVICAL LAMINECTOMY N/A 04/03/2022   Procedure: Chiari decompression;  Surgeon: Jadene Pierini, MD;  Location: [facility] OR;  Service: Neurosurgery;  Laterality: N/A;  RM 21   VENTRICULOPERITONEAL SHUNT N/A 04/29/2022   Procedure: LUMBOPERITONEAL SHUNT PLACEMENT;  Surgeon: Jadene Pierini, MD;  Location: [facility] OR;  Service: Neurosurgery;  Laterality: N/A;    No Known Allergies  Social History   Tobacco Use   Smoking status: Former    Types: Cigarettes    Quit date: 07/2021    Years since quitting: 0.9   Smokeless tobacco: Never  Substance Use Topics   Alcohol use: Yes    Comment: socially    History reviewed. No pertinent family history.   Review of Systems  Positive ROS: as above  All other systems have been reviewed and were otherwise negative with the exception of those mentioned in the HPI and as above.  Objective: Vital signs in last 24 hours: Temp:  [98.4 F (36.9 C)] 98.4 F (36.9 C) (07/03 1533) Pulse Rate:  [82] 82 (07/03 1533) Resp:  [16] 16 (07/03 1533) BP:  (110)/(71) 110/71 (07/03 1533) SpO2:  [95 %] 95 % (07/03 1533)  General Appearance: Alert, cooperative, no distress, appears stated age Head: Normocephalic, without obvious abnormality, atraumatic Eyes: PERRL, conjunctiva/corneas clear, EOM's intact, fundi benign, both eyes      Lungs:  respirations unlabored Heart: Regular rate and rhythm Extremities: Extremities normal, atraumatic, no cyanosis or edema Pulses: 2+ and symmetric all extremities Skin: Skin color, texture, turgor normal, no rashes or lesions  NEUROLOGIC:   Mental status: A&O x4, no aphasia, good attention span, Memory and fund of knowledge Motor Exam - grossly normal, normal tone and bulk Sensory Exam - grossly normal Reflexes: symmetric, no pathologic reflexes, No Hoffman's, No clonus Coordination - grossly normal Gait - not tested Balance - not tested Cranial Nerves: I: smell Not tested  II: visual acuity  OS: na    OD: na  II: visual fields Full to confrontation  II: pupils Equal, round, reactive to light  III,VII: ptosis None  III,IV,VI: extraocular muscles  Full ROM  V: mastication Normal  V: facial light touch sensation  Normal  V,VII: corneal reflex  Present  VII: facial muscle function - upper  Normal  VII: facial muscle function - lower Normal  VIII: hearing Not tested  IX: soft palate elevation  Normal  IX,X: gag reflex Present  XI: trapezius strength  5/5  XI: sternocleidomastoid strength 5/5  XI: neck flexion strength  5/5  XII: tongue strength  Normal    Data Review Lab Results  Component Value Date   WBC 19.5 (H) 05/05/2022   HGB 12.1 05/05/2022   HCT 37.1 05/05/2022   MCV 88.8 05/05/2022   PLT 409 (H) 05/05/2022   Lab Results  Component Value Date   NA 137 05/03/2022   K 3.4 (L) 05/03/2022   CL 102 05/03/2022   CO2 24 05/03/2022   BUN 8 05/03/2022   CREATININE 0.80 05/03/2022   GLUCOSE 123 (H) 05/03/2022   No results found for: "INR", "PROTIME"  Radiology: No results  found.   Assessment/Plan: 26 year old female presented to the hospital for unresponsiveness. Ct of her head was sent to [place] via phone and we reviewed it. It showed a small left sided SDH with no mass effect or midline shift. She seems to be over draining based on her symptoms and the small SDH that is evident on CT so we adjusted her shunt to 2.5 today to see if this helps with her headaches. Will admit for observation and get another head CT in the morning.     Sabrina Diaz Sabrina Diaz 06/05/2022 5:06 PM

## 2022-06-05 NOTE — ED Provider Notes (Signed)
[facility] EMERGENCY DEPARTMENT Provider Note   CSN: [id number] Arrival date & time: 06/05/22  1501     History {Add pertinent medical, surgical, social history, OB history to HPI:1} Chief Complaint  Patient presents with   Headache    Sabrina Diaz is a 26 y.o. female.  Patient s/p suboccipital decompression for Chiari malformation 5/1, and subsequent lumbar peritoneal shunt 5/27, who presents from outside facility where patient presented earlier today with episode of decrease responsiveness.  Arrangements made from that facility to be transferred here to see neurosurgery.  Patient reports daily headaches since having her surgery - no acute/abrupt change today. No vomiting. No change in speech or vision. No numbness/weakness. No fever or chills. No neck pain or stiffness.  Denies change in meds or new meds.   The history is provided by the patient, medical records and the EMS personnel.  Headache Associated symptoms: no abdominal pain, no back pain, no diarrhea, no eye pain, no fever, no neck pain, no sore throat and no vomiting        Home Medications Prior to Admission medications   Medication Sig Start Date End Date Taking? Authorizing Provider  acetaminophen (TYLENOL) 500 MG tablet Take 1,000 mg by mouth every 6 (six) hours as needed for moderate pain or headache.    [provider]  ibuprofen (ADVIL) 200 MG tablet Take 800 mg by mouth every 6 (six) hours as needed for moderate pain or headache.    [provider]  oxyCODONE-acetaminophen (PERCOCET) 5-325 MG tablet Take 1 tablet by mouth every 4 (four) hours as needed for severe pain. 05/06/22 05/06/23  Meyran, Tiana Loft, NP  traZODone (DESYREL) 100 MG tablet Take 1 tablet (100 mg total) by mouth at bedtime. 05/02/22   Ocie Doyne, MD  zonisamide (ZONEGRAN) 100 MG capsule Take 2 capsules (200 mg total) by mouth daily. Take 100 mg (1 pill) daily for two weeks, then increase to 200 mg (2  pills) daily. Patient taking differently: Take 100 mg by mouth 2 (two) times daily. 04/26/22   Ocie Doyne, MD      Allergies    Patient has no known allergies.    Review of Systems   Review of Systems  Constitutional:  Negative for chills and fever.  HENT:  Negative for sore throat.   Eyes:  Negative for pain and visual disturbance.  Respiratory:  Negative for shortness of breath.   Cardiovascular:  Negative for chest pain and leg swelling.  Gastrointestinal:  Negative for abdominal pain, diarrhea and vomiting.  Genitourinary:  Negative for dysuria and flank pain.  Musculoskeletal:  Negative for back pain and neck pain.  Skin:  Negative for rash.  Neurological:  Positive for headaches.  Hematological:  Does not bruise/bleed easily.  Psychiatric/Behavioral:  Negative for confusion.     Physical Exam Updated Vital Signs There were no vitals taken for this visit. Physical Exam Vitals and nursing note reviewed.  Constitutional:      Appearance: Normal appearance. She is well-developed.  HENT:     Head: Atraumatic.     Comments: Occipital surgical site without sign of infection.     Nose: Nose normal.     Mouth/Throat:     Mouth: Mucous membranes are moist.  Eyes:     General: No scleral icterus.    Conjunctiva/sclera: Conjunctivae normal.     Pupils: Pupils are equal, round, and reactive to light.  Neck:     Trachea: No tracheal deviation.  Comments: No stiffness or rigidity.  Cardiovascular:     Rate and Rhythm: Normal rate and regular rhythm.     Pulses: Normal pulses.     Heart sounds: Normal heart sounds. No murmur heard.    No friction rub. No gallop.  Pulmonary:     Effort: Pulmonary effort is normal. No respiratory distress.     Breath sounds: Normal breath sounds.  Abdominal:     General: Bowel sounds are normal. There is no distension.     Palpations: Abdomen is soft.     Tenderness: There is no abdominal tenderness. There is no guarding.      Comments: LP shunt port left flank without sign of infection to area.   Genitourinary:    Comments: No cva tenderness.  Musculoskeletal:        General: No swelling or tenderness.     Cervical back: Normal range of motion and neck supple. No rigidity. No muscular tenderness.  Skin:    General: Skin is warm and dry.     Findings: No rash.  Neurological:     Mental Status: She is alert.     Comments: Alert, speech normal. Motor/sens grossly intact bil.   Psychiatric:        Mood and Affect: Mood normal.     ED Results / Procedures / Treatments   Labs (all labs ordered are listed, but only abnormal results are displayed) Labs Reviewed - No data to display  EKG None  Radiology No results found.  Procedures Procedures  {Document cardiac monitor, telemetry assessment procedure when appropriate:1}  Medications Ordered in ED Medications - No data to display  ED Course/ Medical Decision Making/ A&P                           Medical Decision Making Amount and/or Complexity of Data Reviewed Labs: ordered. ECG/medicine tests: ordered.   Iv ns. Continuous pulse ox and cardiac monitoring. Labs ordered/sent. Imaging ordered.   Reviewed nursing notes and prior charts for additional history. External reports reviewed. Outside facility labs largely unremarkable. ECG w sinus rhythm. Additional history from: EMS.   Cardiac monitor: sinus rhythm, rate 80.  Recent labs from earlier today at outside facility reviewed/interpreted by me - chem normal w hco3 borderline low at 21. Hct 35.  UDS +THC, opiate/ultram.   Neurosurgery consulted. Discussed pt with Dr Yetta Barre - will see in ED.     {Document critical care time when appropriate:1} {Document review of labs and clinical decision tools ie heart score, Chads2Vasc2 etc:1}  {Document your independent review of radiology images, and any outside records:1} {Document your discussion with family members, caretakers, and with  consultants:1} {Document social determinants of health affecting pt's care:1} {Document your decision making why or why not admission, treatments were needed:1} Final Clinical Impression(s) / ED Diagnoses Final diagnoses:  None    Rx / DC Orders ED Discharge Orders     None

## 2022-06-05 NOTE — ED Notes (Signed)
ED TO INPATIENT HANDOFF REPORT  ED Nurse Name and Phone #: Chiyo Fay RN [phone]  S Name/Age/Gender Lawana Chambers 26 y.o. female Room/Bed: H015C/H015C  Code Status   Code Status: Full Code  Home/SNF/Other Home Patient oriented to: self, place, time, and situation Is this baseline? Yes   Triage Complete: Triage complete  Chief Complaint Headache [R51.9]  Triage Note Pt BIB as transfer from [facility]. Pt w/ hx of chiari malformation, had shunt placed on 5/21- has been having shunt adjusted for high/low pressures. Husband found pt borderline unresponsive this morning and was brought to ED in [place]. Per EMS, pt w/ low pressures via shunt this morning. Pt rec'd fentanyl in transit. Pt w/ 32fr foley in place.    Allergies No Known Allergies  Level of Care/Admitting Diagnosis ED Disposition     ED Disposition  Admit   Condition  --   Comment  Hospital Area: [facility] [[id number]]  Level of Care: Med-Surg [16]  May admit patient to [facility] or [facility] if equivalent level of care is available:: No  Covid Evaluation: Asymptomatic - no recent exposure (last 10 days) testing not required  Diagnosis: Headache [[id number]]  Admitting Physician: Dallie Piles  Attending Physician: Tia Alert [[id number]]  Estimated length of stay: 3 - 4 days  Certification:: I certify there are rare and unusual circumstances requiring inpatient admission          B Medical/Surgery History Past Medical History:  Diagnosis Date   Chiari malformation type I (HCC)    Chronic headaches    Past Surgical History:  Procedure Laterality Date   SUBOCCIPITAL CRANIECTOMY CERVICAL LAMINECTOMY N/A 04/03/2022   Procedure: Chiari decompression;  Surgeon: Jadene Pierini, MD;  Location: [facility] OR;  Service: Neurosurgery;  Laterality: N/A;  RM 21   VENTRICULOPERITONEAL SHUNT N/A 04/29/2022   Procedure: LUMBOPERITONEAL SHUNT PLACEMENT;  Surgeon: Jadene Pierini, MD;  Location: [facility] OR;  Service: Neurosurgery;  Laterality: N/A;     A IV Location/Drains/Wounds Patient Lines/Drains/Airways Status     Active Line/Drains/Airways     Name Placement date Placement time Site Days   Incision (Closed) 04/03/22 Neck 04/03/22  0951  -- 63   Incision (Closed) 04/29/22 Abdomen 04/29/22  1857  -- 37   Incision (Closed) 05/04/22 Vertebral column Lower;Mid 05/04/22  --  -- 32   Incision (Closed) 05/04/22 Ischial tuberosity Left 05/04/22  --  -- 32            Intake/Output Last 24 hours No intake or output data in the 24 hours ending 06/05/22 2100  Labs/Imaging No results found for this or any previous visit (from the past 48 hour(s)). No results found.  Pending Labs Unresulted Labs (From admission, onward)     Start     Ordered   06/05/22 1551  Basic metabolic panel  Once,   STAT        06/05/22 1550   06/05/22 1551  CBC  Once,   STAT        06/05/22 1550            Vitals/Pain Today's Vitals   06/05/22 1529 06/05/22 1533 06/05/22 1715 06/05/22 1954  BP:  110/71 98/75 106/85  Pulse:  82 82 75  Resp:  16 16 16   Temp:  98.4 F (36.9 C) 98.7 F (37.1 C) 98.4 F (36.9 C)  TempSrc:  Oral    SpO2:  95% 100% 100%  PainSc: 3        Isolation Precautions No active isolations  Medications Medications  0.9 % NaCl with KCl 20 mEq/ L  infusion (has no administration in time range)  sodium chloride flush (NS) 0.9 % injection 3 mL (has no administration in time range)  sodium chloride flush (NS) 0.9 % injection 3 mL (has no administration in time range)  0.9 %  sodium chloride infusion (has no administration in time range)  senna (SENOKOT) tablet 8.6 mg (has no administration in time range)  ondansetron (ZOFRAN) tablet 4 mg (has no administration in time range)    Or  ondansetron (ZOFRAN) injection 4 mg (has no administration in time range)  acetaminophen (TYLENOL) tablet 1,000 mg (has no administration in time range)   ketorolac (TORADOL) 15 MG/ML injection 15 mg (has no administration in time range)  dexamethasone (DECADRON) injection 4 mg (has no administration in time range)    Or  dexamethasone (DECADRON) tablet 4 mg (has no administration in time range)  alum & mag hydroxide-simeth (MAALOX/MYLANTA) 200-200-20 MG/5ML suspension 30 mL (has no administration in time range)  pantoprazole (PROTONIX) injection 40 mg (has no administration in time range)  oxyCODONE-acetaminophen (PERCOCET/ROXICET) 5-325 MG per tablet 1 tablet (1 tablet Oral Given 06/05/22 1816)  zonisamide (ZONEGRAN) capsule 100 mg (has no administration in time range)  traZODone (DESYREL) tablet 100 mg (has no administration in time range)    Mobility walks with person assist  R Recommendations: See Admitting Provider Note  Report given to: Argentina Ponder RN  Additional Notes:

## 2022-06-06 ENCOUNTER — Inpatient Hospital Stay (HOSPITAL_COMMUNITY): Payer: 59

## 2022-06-06 ENCOUNTER — Other Ambulatory Visit: Payer: Self-pay

## 2022-06-06 DIAGNOSIS — R4189 Other symptoms and signs involving cognitive functions and awareness: Secondary | ICD-10-CM

## 2022-06-06 LAB — GLUCOSE, CAPILLARY: Glucose-Capillary: 133 mg/dL — ABNORMAL HIGH (ref 70–99)

## 2022-06-06 MED ORDER — ACETAZOLAMIDE 250 MG PO TABS
250.0000 mg | ORAL_TABLET | Freq: Two times a day (BID) | ORAL | Status: DC
  Administered 2022-06-06 – 2022-06-12 (×13): 250 mg via ORAL
  Filled 2022-06-06 (×13): qty 1

## 2022-06-06 MED ORDER — PANTOPRAZOLE SODIUM 40 MG PO TBEC
40.0000 mg | DELAYED_RELEASE_TABLET | Freq: Every day | ORAL | Status: DC
  Administered 2022-06-06 – 2022-06-15 (×10): 40 mg via ORAL
  Filled 2022-06-06 (×10): qty 1

## 2022-06-06 NOTE — Progress Notes (Signed)
LTM maint complete - no skin breakdown, serviced C3, placed study on server [facility] monitored, Event button test confirmed by [facility].

## 2022-06-06 NOTE — Significant Event (Addendum)
Rapid Response Event Note   Reason for Call :  Unresponsiveness  Per RN, pt began to scream and then went unresponsive.  Initial Focused Assessment:  Pt lying in bed with eyes open, with snoring respirations. Pt is completely unresponsive,  Her pupils are 6s, equal, and unreactive. She does not blink to threat. Pt has no gag reflex. Per husband at bedside, pt had an similar episode prior to admission and became responsive about 15 min later.   HR-112, BP-173/113, RR-16, SpO2-99% on 2L [place].   Pt taken to CT scan with RN and RT. Per RT, pt began to awaken just prior to transport to CT. At this time, pt began c/o SOB. RT placed pt on a NRB. CT scan was completed. Per RT, pt began to awaken even more on the way back to her room.  On my assessment once back in room, pt awake, moaning. She will answer simple questions, follow simple commands, and move all extremities. She is c/o headache. Pupils are 5s, equal, and reactive.   HR-115, BP-102/, RR-16, SpO2-97% on 2L [place]  Interventions:  CBG-133 STAT CT head-1. 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 2. Additionally, there is mild lateral and 3rd ventriculomegaly. But no definite transependymal edema. Fourth ventricle size not significantly changed from preoperative MRI. 3. No associated acute intracranial hemorrhage or infarct.  4. 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 Tx to PCU Plan of Care:  Pt now alert, but confused. Episode suspicious for a seizure. Place pt on seizure precautions. Tx pt to 4NP03 once bed available. Continue to monitor closely. Call RRT if further assistance need.   Event Summary:   MD Notified: Meyran notified prior to CT by bedside RN. Dr. Conchita Paris notified after CT by  RRT Call Time:0608 Arrival [id number] End [id number] Terrilyn Saver, RN

## 2022-06-06 NOTE — Progress Notes (Signed)
LTM EEG hooked up and running - no initial skin breakdown - push button tested - neuro notified. [facility] monitoring.  

## 2022-06-06 NOTE — Progress Notes (Signed)
Subjective: Patient reports severe headaches and ear pain   Objective: Vital signs in last 24 hours: Temp:  [97.6 F (36.4 C)-98.8 F (37.1 C)] 98.8 F (37.1 C) (07/04 0810) Pulse Rate:  [62-143] 91 (07/04 0810) Resp:  [16-20] 17 (07/04 0810) BP: (93-173)/(63-113) 102/69 (07/04 0810) SpO2:  [95 %-100 %] 96 % (07/04 0810)  Intake/Output from previous day: 07/03 0701 - 07/04 0700 In: 405.2 [I.V.:405.2] Out: 1000 [Urine:1000] Intake/Output this shift: No intake/output data recorded.  Neurologic: Grossly normal  Lab Results: Lab Results  Component Value Date   WBC 15.7 (H) 06/05/2022   HGB 12.3 06/05/2022   HCT 36.5 06/05/2022   MCV 88.4 06/05/2022   PLT 271 06/05/2022   No results found for: "INR", "PROTIME" BMET Lab Results  Component Value Date   NA 140 06/05/2022   K 3.8 06/05/2022   CL 107 06/05/2022   CO2 22 06/05/2022   GLUCOSE 135 (H) 06/05/2022   BUN 9 06/05/2022   CREATININE 0.70 06/05/2022   CALCIUM 8.7 (L) 06/05/2022    Studies/Results: CT HEAD WO CONTRAST ( )  Result Date: 06/06/2022 CLINICAL DATA:  26 year old female with recent headache, possible seizure. Recent suboccipital craniotomy. EXAM: CT HEAD WITHOUT CONTRAST TECHNIQUE: Contiguous axial images were obtained from the base of the skull through the vertex without intravenous contrast. RADIATION DOSE REDUCTION: This exam was performed according to the departmental dose-optimization program which includes automated exposure control, adjustment of the mA and/or kV according to patient size and/or use of iterative reconstruction technique. COMPARISON:  Preoperative brain MRI 10/12/2021. Postoperative head CT 05/03/2022. FINDINGS: Brain: Worsening basilar cistern patency compared to prior exams, with effaced pre medullary cistern and cisterna magna as seen on sagittal image 27 today. Compared to series 13, image 13 of brain MRI 10/12/2021. And there appears to be associated mild enlargement of the lateral  and 3rd ventricles compared to the MRI last year (series 4, image 14). Fourth ventricle size not significantly changed. No definite transependymal edema. Additionally, there is a new low-density left hemisphere extra-axial fluid collection (fairly CSF density series 4, image 13) measuring about 3 mm in thickness. Similar new CSF collection subjacent to the tentorium (coronal image 44). These collections appear similar to the head CT on 04/27/2022, but seemed resolved on 05/03/2022. Trace associated rightward midline shift is new. No intracranial hemorrhage identified. No cortically based acute infarct identified. Gray-white matter differentiation appears preserved throughout. Vascular: No suspicious intracranial vascular hyperdensity. Skull: Suboccipital decompression redemonstrated. No new osseous abnormality. Sinuses/Orbits: Visualized paranasal sinuses and mastoids are stable and well aerated. Other: Visualized orbit soft tissues are within normal limits. Partially visible, at least 33 mL simple fluid density suboccipital pseudomeningocele, appears slightly larger compared to 05/03/2022 (series 4, image 3). IMPRESSION: 1. 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 2. Additionally, there is mild lateral and 3rd ventriculomegaly. But no definite transependymal edema. Fourth ventricle size not significantly changed from preoperative MRI. 3. No associated acute intracranial hemorrhage or infarct. 4. 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 Electronically Signed   By: Odessa Fleming M.D.   On: 06/06/2022 07:01    Assessment/Plan: We did readjust her shunt today to 1.0. Will see if this helps with her headaches. Will likely repeat head CT in 48 hours.    LOS: 1 day    Tiana Loft [facility] 06/06/2022,  9:16 AM

## 2022-06-06 NOTE — Progress Notes (Signed)
I have spent considerable time going through her office notes, her previous notes here in the hospital, and her imaging.  She had a CT scan on 04/27/2022 which showed a left-sided extra-axial fluid collection and increased ventricular size much like she has right now, and she had a lumbar shunt placed 2 days later which improved her symptoms and her imaging.  She is having headaches and pain in her ears, and right now it is difficult to know if these are low pressure headaches or high pressure headaches.  Certainly the crowding at the foramen magnum and the fluid above the cerebellum in the left extra-axial fluid collection suggest over drainage, but I wonder if she is actually draining into the pseudomeningocele instead of through the lumbar shunt.  Dr. Conchita Paris and I have spent considerable time going over imaging and talking about her case.  We went and saw her together.  We actually adjusted her shunt from 2.5-1.0 to increase drainage to the shunt to see if this will stop drainage to the pseudomeningocele and help her symptoms.  Each time she has gotten better in the past has been after reducing the pressure on the shunt.  I have also started Diamox.  This seemed to help last time she was in the hospital.  Cannot rule out shunt infection.  Cannot rule out shunt malfunction.  Talked about CT of the abdomen to rule out pseudocyst, but we can hold off and see if the Diamox and the reduction in the shunt pressure helps first.  We have spent considerable time talking to the family about this and they have demonstrated understanding.  We have talked about doing an MRI, but once again we will give this a little time with Diamox and reduction in shunt pressure to see if it helps.  We will also get a 24-hour EEG because of her 2 episodes of unresponsiveness to make sure there is no seizure activity.  She is on Zonegran as prescribed by her neurologist.

## 2022-06-06 NOTE — Procedures (Signed)
Patient Name: CARIAH SALATINO  MRN: [id number]  Epilepsy Attending: Charlsie Quest  Referring Physician/Provider: Tia Alert, MD  Date: 06/06/2022 Duration: 23.50 mins  Patient history: 26yo F with episode of unresponsiveness. EEG to evaluate for seizure.   Level of alertness: Awake  AEDs during EEG study: ZNS  Technical aspects: This EEG study was done with scalp electrodes positioned according to the 10-20 International system of electrode placement. Electrical activity was acquired at a sampling rate of 500Hz  and reviewed with a high frequency filter of 70Hz  and a low frequency filter of 1Hz . EEG data were recorded continuously and digitally stored.   Description: No clear posterior dominant rhythm was seen. EEG showed continuous generalized 5-9Hz  theta-alpha activity. There is also 2-3Hz  polymorphic sharply contoured delta slowing which at times appear rhythmic but without definite evolution. Hyperventilation and photic stimulation were not performed.     ABNORMALITY - Continuous slow, generalized  IMPRESSION: This study is suggestive of moderate diffuse encephalopathy, nonspecific etiology. No seizures or epileptiform discharges were seen throughout the recording.  Isley Weisheit 

## 2022-06-06 NOTE — Progress Notes (Signed)
EEG complete - results pending 

## 2022-06-06 NOTE — Plan of Care (Signed)

## 2022-06-07 DIAGNOSIS — R4189 Other symptoms and signs involving cognitive functions and awareness: Secondary | ICD-10-CM | POA: Diagnosis not present

## 2022-06-07 LAB — CBC WITH DIFFERENTIAL/PLATELET
Abs Immature Granulocytes: 0.15 10*3/uL — ABNORMAL HIGH (ref 0.00–0.07)
Basophils Absolute: 0 10*3/uL (ref 0.0–0.1)
Basophils Relative: 0 %
Eosinophils Absolute: 0 10*3/uL (ref 0.0–0.5)
Eosinophils Relative: 0 %
HCT: 37.3 % (ref 36.0–46.0)
Hemoglobin: 12.4 g/dL (ref 12.0–15.0)
Immature Granulocytes: 1 %
Lymphocytes Relative: 10 %
Lymphs Abs: 1.9 10*3/uL (ref 0.7–4.0)
MCH: 30 pg (ref 26.0–34.0)
MCHC: 33.2 g/dL (ref 30.0–36.0)
MCV: 90.3 fL (ref 80.0–100.0)
Monocytes Absolute: 0.3 10*3/uL (ref 0.1–1.0)
Monocytes Relative: 2 %
Neutro Abs: 16.6 10*3/uL — ABNORMAL HIGH (ref 1.7–7.7)
Neutrophils Relative %: 87 %
Platelets: 286 10*3/uL (ref 150–400)
RBC: 4.13 MIL/uL (ref 3.87–5.11)
RDW: 13.4 % (ref 11.5–15.5)
WBC: 18.9 10*3/uL — ABNORMAL HIGH (ref 4.0–10.5)
nRBC: 0 % (ref 0.0–0.2)

## 2022-06-07 MED ORDER — DEXAMETHASONE SODIUM PHOSPHATE 4 MG/ML IJ SOLN: 4.0000 mg | Freq: Three times a day (TID) | INTRAMUSCULAR | Status: DC

## 2022-06-07 MED ORDER — DEXAMETHASONE 4 MG PO TABS
4.0000 mg | ORAL_TABLET | Freq: Three times a day (TID) | ORAL | Status: DC
  Administered 2022-06-07 – 2022-06-08 (×3): 4 mg via ORAL
  Filled 2022-06-07 (×3): qty 1

## 2022-06-07 NOTE — Progress Notes (Signed)
vLTM maintenance  No skin breakdown noted at all skin sites.  All impedances below 10kohms 

## 2022-06-07 NOTE — Progress Notes (Signed)
Mobility Specialist Progress Note   06/07/22 1007  Mobility  Activity Turned to back - supine  Range of Motion/Exercises All extremities  Level of Assistance Independent  Assistive Device None  Activity Response Tolerated well  $Mobility charge 1 Mobility   Pre Mobility: 89 HR, 96% SpO2 During Mobility: 97 HR, 94% SpO2 Post Mobility: 90 HR, 108/63 BP, 94% SpO2  RN requesting to keep pt supine until EEG finishes, limiting pt to bed mobility. Pt having mild HA prior to mobility but performing all exercises w/o difficulty. No symptoms post bed exercises but HA still present and mild. Left w/ call bell in reach and needs met.  Holland Falling Mobility Specialist MS [facility] #:  [phone] Acute Rehab Office:  [phone]

## 2022-06-07 NOTE — Progress Notes (Signed)
Subjective: Patient reports some headache when she starts to set up, but overall she is feeling much better.  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  Otherwise no visual changes.  No nausea and vomiting.  No numbness tingling or weakness.  Objective: Vital signs in last 24 hours: Temp:  [97.7 F (36.5 C)-99 F (37.2 C)] 97.8 F (36.6 C) (07/05 0740) Pulse Rate:  [70-97] 81 (07/05 0804) Resp:  [15-21] 19 (07/05 0804) BP: (99-109)/(54-65) 103/64 (07/05 0740) SpO2:  [92 %-96 %] 96 % (07/05 0804)  Intake/Output from previous day: 07/04 0701 - 07/05 0700 In: 385.3 [P.O.:240; I.V.:145.3] Out: 2800 [Urine:2800] Intake/Output this shift: No intake/output data recorded.  Neurologic: Grossly normal no obvious cranial nerve abnormality, no motor or sensory deficit.  Gait not tested.  Lab Results: Lab Results  Component Value Date   WBC 18.9 (H) 06/07/2022   HGB 12.4 06/07/2022   HCT 37.3 06/07/2022   MCV 90.3 06/07/2022   PLT 286 06/07/2022   No results found for: "INR", "PROTIME" BMET Lab Results  Component Value Date   NA 140 06/05/2022   K 3.8 06/05/2022   CL 107 06/05/2022   CO2 22 06/05/2022   GLUCOSE 135 (H) 06/05/2022   BUN 9 06/05/2022   CREATININE 0.70 06/05/2022   CALCIUM 8.7 (L) 06/05/2022    Studies/Results: Overnight EEG with video  Result Date: 06/07/2022 Charlsie Quest, MD     06/07/2022  9:09 AM Patient Name: HAGAN MALTZ MRN: [id number] Epilepsy Attending: Charlsie Quest Referring Physician/Provider: Tia Alert, MD Duration: 06/06/2022 1136 to 06/07/2022 0910  Patient history: 26yo F with episode of unresponsiveness. EEG to evaluate for seizure.  Level of alertness: Awake, asleep  AEDs during EEG study: ZNS  Technical aspects: This EEG study was done with scalp electrodes positioned according to the 10-20 International system of electrode placement. Electrical activity was acquired at a sampling rate of 500Hz  and reviewed with a high frequency filter of  70Hz  and a low frequency filter of 1Hz . EEG data were recorded continuously and digitally stored.  Description: No clear posterior dominant rhythm was seen.  Sleep was characterized by vertex waves, sleep spindles (12 to 14 Hz), maximal frontocentral region.  EEG showed continuous generalized 5-9Hz  theta-alpha activity. There is also 2-3Hz  polymorphic sharply contoured delta slowing which at times appear rhythmic but without definite evolution. Hyperventilation and photic stimulation were not performed.    ABNORMALITY - Continuous slow, generalized  IMPRESSION: This study is suggestive of moderate diffuse encephalopathy, nonspecific etiology. No seizures or epileptiform discharges were seen throughout the recording.     EEG adult  Result Date: 06/06/2022 , MD     06/06/2022  1:18 PM Patient Name: EMYA PICADO MRN: Charlsie Quest Epilepsy Attending: 08/07/2022 Referring Physician/Provider: Lawana Chambers, MD Date: 06/06/2022 Duration: 23.50 mins Patient history: 26yo F with episode of unresponsiveness. EEG to evaluate for seizure. Level of alertness: Awake AEDs during EEG study: ZNS Technical aspects: This EEG study was done with scalp electrodes positioned according to the 10-20 International system of electrode placement. Electrical activity was acquired at a sampling rate of 500Hz  and reviewed with a high frequency filter of 70Hz  and a low frequency filter of 1Hz . EEG data were recorded continuously and digitally stored. Description: No clear posterior dominant rhythm was seen. EEG showed continuous generalized 5-9Hz  theta-alpha activity. There is also 2-3Hz  polymorphic sharply contoured delta slowing which at times appear rhythmic  but without definite evolution. Hyperventilation and photic stimulation were not performed.   ABNORMALITY - Continuous slow, generalized IMPRESSION: This study is suggestive of moderate diffuse encephalopathy, nonspecific etiology. No seizures or  epileptiform discharges were seen throughout the recording. Priyanka Annabelle Harman   CT HEAD WO CONTRAST ( )  Result Date: 06/06/2022 CLINICAL DATA:  26 year old female with recent headache, possible seizure. Recent suboccipital craniotomy. EXAM: CT HEAD WITHOUT CONTRAST TECHNIQUE: Contiguous axial images were obtained from the base of the skull through the vertex without intravenous contrast. RADIATION DOSE REDUCTION: This exam was performed according to the departmental dose-optimization program which includes automated exposure control, adjustment of the mA and/or kV according to patient size and/or use of iterative reconstruction technique. COMPARISON:  Preoperative brain MRI 10/12/2021. Postoperative head CT 05/03/2022. FINDINGS: Brain: Worsening basilar cistern patency compared to prior exams, with effaced pre medullary cistern and cisterna magna as seen on sagittal image 27 today. Compared to series 13, image 13 of brain MRI 10/12/2021. And there appears to be associated mild enlargement of the lateral and 3rd ventricles compared to the MRI last year (series 4, image 14). Fourth ventricle size not significantly changed. No definite transependymal edema. Additionally, there is a new low-density left hemisphere extra-axial fluid collection (fairly CSF density series 4, image 13) measuring about 3 mm in thickness. Similar new CSF collection subjacent to the tentorium (coronal image 44). These collections appear similar to the head CT on 04/27/2022, but seemed resolved on 05/03/2022. Trace associated rightward midline shift is new. No intracranial hemorrhage identified. No cortically based acute infarct identified. Gray-white matter differentiation appears preserved throughout. Vascular: No suspicious intracranial vascular hyperdensity. Skull: Suboccipital decompression redemonstrated. No new osseous abnormality. Sinuses/Orbits: Visualized paranasal sinuses and mastoids are stable and well aerated. Other:  Visualized orbit soft tissues are within normal limits. Partially visible, at least 33 mL simple fluid density suboccipital pseudomeningocele, appears slightly larger compared to 05/03/2022 (series 4, image 3). IMPRESSION: 1. 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 2. Additionally, there is mild lateral and 3rd ventriculomegaly. But no definite transependymal edema. Fourth ventricle size not significantly changed from preoperative MRI. 3. No associated acute intracranial hemorrhage or infarct. 4. 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 Electronically Signed   By: Odessa Fleming M.D.   On: 06/06/2022 07:01    Assessment/Plan: She looks a lot better today.  I am not sure if it is the Decadron and the Diamox or lowering the pressure on the shunt, but she feels better and looks better.  She still gets headache if she tries to sit up.  Hopefully the leukocytosis is from the steroids.  We will repeat CT scan of the head tomorrow  Estimated body mass index is 33.66 kg/m as calculated from the following:   Height as of 05/03/22: 5\' 6"  (1.676 m).   Weight as of 05/03/22: 94.6 kg.    LOS: 2 days    05/05/22 06/07/2022, 10:23 AM

## 2022-06-07 NOTE — Procedures (Signed)
Patient Name: Sabrina Diaz  MRN: [id number]  Epilepsy Attending: Charlsie Quest  Referring Physician/Provider: Tia Alert, MD  Duration: 06/06/2022 1136 to 06/07/2022 0910   Patient history: 26yo F with episode of unresponsiveness. EEG to evaluate for seizure.    Level of alertness: Awake, asleep   AEDs during EEG study: ZNS   Technical aspects: This EEG study was done with scalp electrodes positioned according to the 10-20 International system of electrode placement. Electrical activity was acquired at a sampling rate of 500Hz  and reviewed with a high frequency filter of 70Hz  and a low frequency filter of 1Hz . EEG data were recorded continuously and digitally stored.    Description: No clear posterior dominant rhythm was seen.  Sleep was characterized by vertex waves, sleep spindles (12 to 14 Hz), maximal frontocentral region.  EEG showed continuous generalized 5-9Hz  theta-alpha activity. There is also 2-3Hz  polymorphic sharply contoured delta slowing which at times appear rhythmic but without definite evolution. Hyperventilation and photic stimulation were not performed.      ABNORMALITY - Continuous slow, generalized   IMPRESSION: This study is suggestive of moderate diffuse encephalopathy, nonspecific etiology. No seizures or epileptiform discharges were seen throughout the recording.   Hommer Cunliffe 

## 2022-06-08 ENCOUNTER — Inpatient Hospital Stay (HOSPITAL_COMMUNITY): Payer: 59

## 2022-06-08 MED ORDER — BISACODYL 5 MG PO TBEC
5.0000 mg | DELAYED_RELEASE_TABLET | Freq: Every day | ORAL | Status: DC | PRN
  Administered 2022-06-08 – 2022-06-11 (×4): 5 mg via ORAL
  Filled 2022-06-08 (×4): qty 1

## 2022-06-08 MED ORDER — DEXAMETHASONE 4 MG PO TABS
2.0000 mg | ORAL_TABLET | Freq: Three times a day (TID) | ORAL | Status: DC
  Administered 2022-06-08 – 2022-06-09 (×3): 2 mg via ORAL
  Filled 2022-06-08 (×4): qty 1

## 2022-06-08 MED ORDER — DEXAMETHASONE SODIUM PHOSPHATE 4 MG/ML IJ SOLN
2.0000 mg | Freq: Three times a day (TID) | INTRAMUSCULAR | Status: DC
  Filled 2022-06-08: qty 1

## 2022-06-08 NOTE — Progress Notes (Signed)
LTM maint complete - no skin breakdown Serviced Cz Pz F4 P3 T7 F8 C3 P7   [facility] monitored, Event button test confirmed by [facility].

## 2022-06-08 NOTE — Evaluation (Signed)
Physical Therapy Evaluation Patient Details Name: ZAKYIA GAGAN MRN: [id number] DOB: 04/17/1996 Today's Date: 06/08/2022  History of Present Illness  26 y/o female presented to ED on 06/05/22 for transfer from [facility] for unresponsive episode. 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 EEG negative. CT head concerning for mild hydrocephalus. 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 PTA, patient lives with husband and reports independence. Patient currently presenting with impaired balance and generalized weakness. Patient required minA to stand initially due to not being OOB for 3 or more days. Ambulated in hallway with min guard due to initial unsteadiness but improved with time. Encouraged continued mobility in hallway with nursing staff and to continue to self monitor symptoms, patient verbalized understanding. Patient will benefit from skilled PT services during acute stay to address listed deficits. No PT follow up recommended at this time.        Recommendations for follow up therapy are one component of a multi-disciplinary discharge planning process, led by the attending physician.  Recommendations may be updated based on patient status, additional functional criteria and insurance authorization.  Follow Up Recommendations No PT follow up      Assistance Recommended at Discharge Intermittent Supervision/Assistance  Patient can return home with the following  Assistance with cooking/housework;A little help with bathing/dressing/bathroom;Help with stairs or ramp for entrance    Equipment Recommendations None recommended by PT  Recommendations for Other Services       Functional Status Assessment Patient has had a recent decline in their functional status and demonstrates the ability to make significant improvements in function in a  reasonable and predictable amount of time.     Precautions / Restrictions Precautions Precautions: Fall Restrictions Weight Bearing Restrictions: No      Mobility  Bed Mobility Overal bed mobility: Modified Independent             General bed mobility comments: increased time to complete but no assist needed    Transfers Overall transfer level: Needs assistance Equipment used: None Transfers: Sit to/from Stand Sit to Stand: Min assist           General transfer comment: minA initially to steady as patient has not been OOB x 3 or more days.    Ambulation/Gait Ambulation/Gait assistance: Min guard Gait Distance (Feet): 200 Feet Assistive device: None Gait Pattern/deviations: Step-through pattern, Decreased stride length Gait velocity: Decreased     General Gait Details: slow guarded gait. Unsteady initially but progressing to more fluid gait pattern with time.  Stairs            Wheelchair Mobility    Modified Rankin (Stroke Patients Only)       Balance Overall balance assessment: Mild deficits observed, not formally tested                                           Pertinent Vitals/Pain Pain Assessment Pain Assessment: Faces Faces Pain Scale: Hurts a little bit Pain Location: head Pain Descriptors / Indicators: Grimacing, Guarding Pain Intervention(s): Monitored during session, Repositioned    Home Living Family/patient expects to be discharged to:: Private residence Living Arrangements: Spouse/significant other;Parent Available Help at Discharge: Family Type of Home: House Home Access: Stairs to enter Entrance Stairs-Rails: Right;Left;Can reach both Entrance Stairs-Number of Steps: 3  Home Layout: One level Home Equipment: Grab bars - tub/shower      Prior Function Prior Level of Function : Independent/Modified Independent                     Hand Dominance        Extremity/Trunk Assessment   Upper  Extremity Assessment Upper Extremity Assessment: Defer to OT evaluation    Lower Extremity Assessment Lower Extremity Assessment: Generalized weakness    Cervical / Trunk Assessment Cervical / Trunk Assessment: Other exceptions  Communication   Communication: No difficulties  Cognition Arousal/Alertness: Awake/alert Behavior During Therapy: WFL for tasks assessed/performed Overall Cognitive Status: Within Functional Limits for tasks assessed                                          General Comments General comments (skin integrity, edema, etc.): mother and grandmother present    Exercises     Assessment/Plan    PT Assessment Patient needs continued PT services  PT Problem List Decreased strength;Decreased activity tolerance;Decreased balance       PT Treatment Interventions DME instruction;Gait training;Stair training;Functional mobility training;Therapeutic activities;Therapeutic exercise;Balance training    PT Goals (Current goals can be found in the Care Plan section)  Acute Rehab PT Goals Patient Stated Goal: to get better PT Goal Formulation: With patient Time For Goal Achievement: 06/22/22 Potential to Achieve Goals: Good    Frequency Min 3X/week     Co-evaluation               AM-PAC PT "6 Clicks" Mobility  Outcome Measure Help needed turning from your back to your side while in a flat bed without using bedrails?: None Help needed moving from lying on your back to sitting on the side of a flat bed without using bedrails?: None Help needed moving to and from a bed to a chair (including a wheelchair)?: A Little Help needed standing up from a chair using your arms (e.g., wheelchair or bedside chair)?: A Little Help needed to walk in hospital room?: A Little Help needed climbing 3-5 steps with a railing? : A Little 6 Click Score: 20    End of Session Equipment Utilized During Treatment: Gait belt (for patient comfort) Activity  Tolerance: Patient tolerated treatment well Patient left: in bed;with call bell/phone within reach;with family/visitor present Nurse Communication: Mobility status PT Visit Diagnosis: Unsteadiness on feet (R26.81)    Time: 9937-1696 PT Time Calculation (min) (ACUTE ONLY): 28 min   Charges:   PT Evaluation $PT Eval Moderate Complexity: 1 Mod PT Treatments $Therapeutic Activity: 8-22 mins        Skylynn Burkley A. Dan Humphreys PT, DPT Acute Rehabilitation Services Office [phone]   Viviann Spare 06/08/2022, 5:04 PM

## 2022-06-08 NOTE — Progress Notes (Signed)
EEG tech notified by nursing staff patient will be going to CT, requesting assistance to disconnect patient head box form cpu-cart. Disconnect performed.

## 2022-06-08 NOTE — Progress Notes (Signed)
Pt had foley that was placed at previous facility. Per pt, it was because she was unable to get up and can't mentally use a bed pan. Pt feels ready to remove at this time. Verbal order from Dr. Danielle Dess to remove. Foley removed at this time.   Robina Ade, RN

## 2022-06-08 NOTE — Progress Notes (Signed)
LTM EEG discontinued - no skin breakdown at unhook.   

## 2022-06-08 NOTE — Progress Notes (Signed)
Subjective: Patient reports doing ok, no acute events overnight. Some headaches still but improving   Objective: Vital signs in last 24 hours: Temp:  [97.4 F (36.3 C)-98.7 F (37.1 C)] 97.7 F (36.5 C) (07/06 0700) Pulse Rate:  [63-97] 63 (07/06 0700) Resp:  [14-23] 16 (07/06 0700) BP: (99-111)/(60-74) 105/74 (07/06 0700) SpO2:  [95 %-97 %] 96 % (07/06 0700)  Intake/Output from previous day: 07/05 0701 - 07/06 0700 In: 840 [P.O.:840] Out: 1700 [Urine:1700] Intake/Output this shift: Total I/O In: -  Out: 250 [Urine:250]  Neurologic: Grossly normal  Lab Results: Lab Results  Component Value Date   WBC 18.9 (H) 06/07/2022   HGB 12.4 06/07/2022   HCT 37.3 06/07/2022   MCV 90.3 06/07/2022   PLT 286 06/07/2022   No results found for: "INR", "PROTIME" BMET Lab Results  Component Value Date   NA 140 06/05/2022   K 3.8 06/05/2022   CL 107 06/05/2022   CO2 22 06/05/2022   GLUCOSE 135 (H) 06/05/2022   BUN 9 06/05/2022   CREATININE 0.70 06/05/2022   CALCIUM 8.7 (L) 06/05/2022    Studies/Results: Overnight EEG with video  Result Date: 06/07/2022 Charlsie Quest, MD     06/07/2022  9:09 AM Patient Name: PRISILA DLOUHY MRN: [id number] Epilepsy Attending: Charlsie Quest Referring Physician/Provider: Tia Alert, MD Duration: 06/06/2022 1136 to 06/07/2022 0910  Patient history: 26yo F with episode of unresponsiveness. EEG to evaluate for seizure.  Level of alertness: Awake, asleep  AEDs during EEG study: ZNS  Technical aspects: This EEG study was done with scalp electrodes positioned according to the 10-20 International system of electrode placement. Electrical activity was acquired at a sampling rate of 500Hz  and reviewed with a high frequency filter of 70Hz  and a low frequency filter of 1Hz . EEG data were recorded continuously and digitally stored.  Description: No clear posterior dominant rhythm was seen.  Sleep was characterized by vertex waves, sleep spindles (12 to 14 Hz),  maximal frontocentral region.  EEG showed continuous generalized 5-9Hz  theta-alpha activity. There is also 2-3Hz  polymorphic sharply contoured delta slowing which at times appear rhythmic but without definite evolution. Hyperventilation and photic stimulation were not performed.    ABNORMALITY - Continuous slow, generalized  IMPRESSION: This study is suggestive of moderate diffuse encephalopathy, nonspecific etiology. No seizures or epileptiform discharges were seen throughout the recording.     EEG adult  Result Date: 06/06/2022 , MD     06/06/2022  1:18 PM Patient Name: HELEM REESOR MRN: Charlsie Quest Epilepsy Attending: 08/07/2022 Referring Physician/Provider: Lawana Chambers, MD Date: 06/06/2022 Duration: 23.50 mins Patient history: 26yo F with episode of unresponsiveness. EEG to evaluate for seizure. Level of alertness: Awake AEDs during EEG study: ZNS Technical aspects: This EEG study was done with scalp electrodes positioned according to the 10-20 International system of electrode placement. Electrical activity was acquired at a sampling rate of 500Hz  and reviewed with a high frequency filter of 70Hz  and a low frequency filter of 1Hz . EEG data were recorded continuously and digitally stored. Description: No clear posterior dominant rhythm was seen. EEG showed continuous generalized 5-9Hz  theta-alpha activity. There is also 2-3Hz  polymorphic sharply contoured delta slowing which at times appear rhythmic but without definite evolution. Hyperventilation and photic stimulation were not performed.   ABNORMALITY - Continuous slow, generalized IMPRESSION: This study is suggestive of moderate diffuse encephalopathy, nonspecific etiology. No seizures or epileptiform discharges were seen throughout the recording. Priyanka O  Yadav    Assessment/Plan: No new recommendations today. We are waiting for her head CT to be done. She seems to be doing much better since we lowered her  pressure to 1.0. Would like her to ambulate in the hallway today with therapy.   LOS: 3 days    Tiana Loft [facility] 06/08/2022, 8:23 AM

## 2022-06-09 ENCOUNTER — Inpatient Hospital Stay (HOSPITAL_COMMUNITY): Payer: 59

## 2022-06-09 LAB — URINALYSIS, ROUTINE W REFLEX MICROSCOPIC
Bacteria, UA: NONE SEEN
Bilirubin Urine: NEGATIVE
Glucose, UA: NEGATIVE mg/dL
Hgb urine dipstick: NEGATIVE
Ketones, ur: NEGATIVE mg/dL
Leukocytes,Ua: NEGATIVE
Nitrite: NEGATIVE
Protein, ur: NEGATIVE mg/dL
Specific Gravity, Urine: 1.004 — ABNORMAL LOW (ref 1.005–1.030)
pH: 7 (ref 5.0–8.0)

## 2022-06-09 LAB — CBC WITH DIFFERENTIAL/PLATELET
Abs Immature Granulocytes: 0.25 10*3/uL — ABNORMAL HIGH (ref 0.00–0.07)
Basophils Absolute: 0 10*3/uL (ref 0.0–0.1)
Basophils Relative: 0 %
Eosinophils Absolute: 0 10*3/uL (ref 0.0–0.5)
Eosinophils Relative: 0 %
HCT: 39.5 % (ref 36.0–46.0)
Hemoglobin: 13 g/dL (ref 12.0–15.0)
Immature Granulocytes: 1 %
Lymphocytes Relative: 13 %
Lymphs Abs: 2.5 10*3/uL (ref 0.7–4.0)
MCH: 29.3 pg (ref 26.0–34.0)
MCHC: 32.9 g/dL (ref 30.0–36.0)
MCV: 89 fL (ref 80.0–100.0)
Monocytes Absolute: 1.1 10*3/uL — ABNORMAL HIGH (ref 0.1–1.0)
Monocytes Relative: 6 %
Neutro Abs: 15.9 10*3/uL — ABNORMAL HIGH (ref 1.7–7.7)
Neutrophils Relative %: 80 %
Platelets: 292 10*3/uL (ref 150–400)
RBC: 4.44 MIL/uL (ref 3.87–5.11)
RDW: 13.3 % (ref 11.5–15.5)
WBC: 19.8 10*3/uL — ABNORMAL HIGH (ref 4.0–10.5)
nRBC: 0 % (ref 0.0–0.2)

## 2022-06-09 LAB — CSF CELL COUNT WITH DIFFERENTIAL
Eosinophils, CSF: 0 % (ref 0–1)
Lymphs, CSF: 88 % — ABNORMAL HIGH (ref 40–80)
Monocyte-Macrophage-Spinal Fluid: 11 % — ABNORMAL LOW (ref 15–45)
RBC Count, CSF: 2 /mm3 — ABNORMAL HIGH
Segmented Neutrophils-CSF: 1 % (ref 0–6)
Tube #: 3
WBC, CSF: 36 /mm3 (ref 0–5)

## 2022-06-09 LAB — PROTEIN, CSF: Total  Protein, CSF: 40 mg/dL (ref 15–45)

## 2022-06-09 LAB — GLUCOSE, CSF: Glucose, CSF: 65 mg/dL (ref 40–70)

## 2022-06-09 MED ORDER — LIDOCAINE HCL (PF) 1 % IJ SOLN
5.0000 mL | Freq: Once | INTRAMUSCULAR | Status: AC
  Administered 2022-06-09: 3 mL via INTRADERMAL

## 2022-06-09 MED ORDER — IOHEXOL 9 MG/ML PO SOLN
ORAL | Status: AC
  Filled 2022-06-09: qty 1000

## 2022-06-09 MED ORDER — DEXAMETHASONE SODIUM PHOSPHATE 4 MG/ML IJ SOLN
1.0000 mg | Freq: Four times a day (QID) | INTRAMUSCULAR | Status: DC
  Administered 2022-06-09 – 2022-06-12 (×3): 1 mg via INTRAVENOUS
  Filled 2022-06-09 (×4): qty 1

## 2022-06-09 MED ORDER — DEXAMETHASONE 0.5 MG PO TABS
1.0000 mg | ORAL_TABLET | Freq: Four times a day (QID) | ORAL | Status: DC
  Administered 2022-06-09 – 2022-06-11 (×9): 1 mg via ORAL
  Filled 2022-06-09 (×13): qty 2

## 2022-06-09 NOTE — Progress Notes (Signed)
Mobility Specialist Progress Note   06/09/22 1527  Mobility  Activity Stood at bedside (2x10)  Level of Assistance Standby assist, set-up cues, supervision of patient - no hands on  Assistive Device None  Activity Response Tolerated well  $Mobility charge 1 Mobility   Pre Mobility: 80 HR, 127/80 BP, 96% SpO2 During Mobility: 115 HR, 94% SpO2 Post Mobility: 88 HR, 137/67 BP, 97% SpO2  Pt received in bed and agreeable to mobility. Having no c/o pain currently and able to get EOB w/ cues for hand placement. 2x10 reps of STS w/ x1 seated rest break in between, no problem stated throughout.  Pt returned back supine w/ mild HA after session. Notified RN about HA   Frederico Hamman Mobility Specialist MS [facility] #:  [phone] Acute Rehab Office:  [phone]

## 2022-06-09 NOTE — Progress Notes (Signed)
MD paged regarding STE , on tele monitor, pt denies pain /discomfort , resting quietly with no visible sign of distress noted at this time.

## 2022-06-09 NOTE — TOC Progression Note (Signed)
Transition of Care [facility]) - Progression Note    Patient Details  Name: Sabrina Diaz MRN: [id number] Date of Birth: 27-Nov-1996  [facility] CM/SW Contact  Beckie Busing, RN Phone Number:[phone]  06/09/2022, 2:26 PM  Clinical Narrative:     TOC following patient admitted S/p Chiari decompression and LP shunt placement with continued headaches and elevated white blood cell count. Transition of Care Department [facility]) has reviewed patient and no TOC needs have been identified at this time. We will continue to monitor patient advancement through interdisciplinary progression rounds.         Expected Discharge Plan and Services                                                 Social Determinants of Health (SDOH) Interventions    Readmission Risk Interventions     No data to display

## 2022-06-09 NOTE — Procedures (Signed)
Technically successful L2/L3 lumbar puncture yielding 12 ml of clear CSF for laboratory studies. Please see full dictation under Imaging tab in Epic.   Alwyn Ren, [place] [phone] 06/09/2022, 10:41 AM

## 2022-06-09 NOTE — Consult Note (Signed)
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 Ophthalmology was consulted due to decreased vision in her left eye with a large, stationary, rose petal-like floater that started on Monday. 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 She wears glasses but does not have them with her. Last seen an eye provider a year ago but states her eye was normal before this episode. No changes to her right eye.  Objective: Vital signs in last 24 hours: Temp:  [97.5 F (36.4 C)-98.1 F (36.7 C)] 97.5 F (36.4 C) (07/07 1523) Pulse Rate:  [60-93] 93 (07/07 1523) Resp:  [17-21] 20 (07/07 1523) BP: (108-137)/(64-76) 137/67 (07/07 1523) SpO2:  [92 %-97 %] 97 % (07/07 1523) Weight change:  Last BM Date :  (PTA)  Intake/Output from previous day: 07/06 0701 - 07/07 0700 In: -  Out: 1250 [Urine:1250] Intake/Output this shift: No intake/output data recorded.   Base Eye Exam  Visual Acuity (ETDRS)   Right Left  Dist [place] 20/40   Dist ph [place] HM    Tonometry (Tonopen)   Right Left  Pressure 11 10   Pupils   APD  Right None  Left None   Visual Fields   Right Left   Full    Extraocular Movement   Right Left   Full Full   Neuro/Psych  Oriented x3: Yes  Mood/Affect: Normal         Slit Lamp and Fundus Exam   External Exam   Right Left  External Normal Normal   Slit Lamp Exam   Right Left  Lids/Lashes Normal Normal  Conjunctiva/Sclera White and quiet White and quiet  Cornea Clear Clear  Anterior Chamber Deep and quiet Deep and quiet  Iris Grossly normal Grossly normal  Lens         Fundus Exam (deferred dilation in the right eye to preserve pupil/APD exam)   Right Clear  Posterior Vitreous Clear Clear  Disc Pink, sharp margins Grade 2 papilledema, no obscuration of vessels.  C/D Ratio 0. 0.1  Macula Flat Dense pre-retinal  hemorrhage extending from optic disc to temporal macula. Obscures temporal optic disc margin and inferior arcade. Smaller pre-retinal hemorrhage inferior obscuring retinal vessels.  Vessels Normal course and caliber Normal course and caliber  Periphery Attached Attached     Recent Labs    06/07/22 0903 06/09/22 0710  WBC 18.9* 19.8*  HGB 12.4 13.0  HCT 37.3 39.5    Studies/Results: CT ABDOMEN PELVIS WO CONTRAST  Result Date: 06/09/2022 CLINICAL DATA:  Acute abdominal pain. EXAM: CT ABDOMEN AND PELVIS WITHOUT CONTRAST TECHNIQUE: Multidetector CT imaging of the abdomen and pelvis was performed following the standard protocol without IV contrast. RADIATION DOSE REDUCTION: This exam was performed according to the departmental dose-optimization program which includes automated exposure control, adjustment of the mA and/or kV according to patient size and/or use of iterative reconstruction technique. COMPARISON:  05/05/2022 FINDINGS: Lower chest: Unremarkable. Hepatobiliary: No suspicious focal abnormality in the liver on this study without intravenous contrast. There is no evidence for gallstones, gallbladder wall thickening, or pericholecystic fluid. No intrahepatic or extrahepatic biliary dilation. Pancreas: No focal mass lesion. No dilatation of the main duct. No intraparenchymal cyst. No peripancreatic edema. Spleen: No splenomegaly. No focal mass lesion. Adrenals/Urinary Tract: No adrenal nodule or mass. Right kidney and ureter are unremarkable. 2 mm nonobstructing stone identified interpolar  left kidney. No left ureteral stone. No secondary changes in the left kidney or ureter. The urinary bladder appears normal for the degree of distention. Stomach/Bowel: Stomach is unremarkable. No gastric wall thickening. No evidence of outlet obstruction. Duodenum is normally positioned as is the ligament of Treitz. No small bowel wall thickening. No small bowel dilatation. The terminal ileum is normal. The  appendix is not well visualized, but there is no edema or inflammation in the region of the cecum. No gross colonic mass. No colonic wall thickening. Vascular/Lymphatic: No abdominal aortic aneurysm. No abdominal aortic atherosclerotic calcification. There is no gastrohepatic or hepatoduodenal ligament lymphadenopathy. No retroperitoneal or mesenteric lymphadenopathy. No pelvic sidewall lymphadenopathy. Reproductive: The uterus is unremarkable.  There is no adnexal mass. Other: Shunt tubing is seen in the pelvis. No intraperitoneal free fluid. Musculoskeletal: No worrisome lytic or sclerotic osseous abnormality. IMPRESSION: 1. No acute findings in the abdomen or pelvis. Specifically, no findings to explain the patient's history of abdominal pain. 2. 2 mm nonobstructing left renal stone. Electronically Signed   By: Kennith Center M.D.   On: 06/09/2022 12:20   DG FL GUIDED LUMBAR PUNCTURE  Result Date: 06/09/2022 CLINICAL DATA:  Status post Chiari decompression and LP shunt placement, continued headaches, rising white blood cell count, decreased vision in left eye. EXAM: DIAGNOSTIC LUMBAR PUNCTURE UNDER FLUOROSCOPIC GUIDANCE COMPARISON:  Fluoroscopically-guided lumbar puncture 04/28/2022 FLUOROSCOPY: Fluoroscopy time: 48 seconds (13.60 mGy). PROCEDURE: Prior to the procedure, Alwyn Ren, NP obtained informed consent from the patient. This process included a discussion of the procedural risks. The patient was positioned prone on the fluoroscopy table. A skin entry site was determined under fluoroscopy and marked. The operator donned sterile gloves and a mask. The lower back was prepped with Betadine and draped. 1% Lidocaine was used for local anesthesia. Lumbar puncture was performed at the L2-L3 level using a 20-gauge needle with return of clear CSF with an opening pressure of 29 cm water. 12 mL of CSF were obtained for laboratory studies. Closing pressure: 18 cm water. The patient tolerated the procedure well,  and no immediate post-procedure complication was apparent. The procedure was performed by Alwyn Ren, NP, and was supervised and interpreted by Dr. Jackey Loge. IMPRESSION: 1. Technically successful fluoroscopically-guided L2-L3 lumbar puncture. 2. Opening pressure: 29 cm water. 3. 12 mL of CSF obtained for laboratory studies. 4. Closing pressure: 12 cm water. Electronically Signed   By: Jackey Loge D.O.   On: 06/09/2022 11:51   DG Chest 2 View  Result Date: 06/09/2022 CLINICAL DATA:  Shortness of breath and chest pain. EXAM: CHEST - 2 VIEW COMPARISON:  None Available. FINDINGS: The lungs are clear without focal pneumonia, edema, pneumothorax or pleural effusion. The cardiopericardial silhouette is within normal limits for size. The visualized bony structures of the thorax are unremarkable. Telemetry leads overlie the chest. IMPRESSION: No active cardiopulmonary disease. Electronically Signed   By: Kennith Center M.D.   On: 06/09/2022 10:09   CT HEAD WO CONTRAST ( )  Result Date: 06/08/2022 CLINICAL DATA:  Headache, chronic, new features or increased frequency EXAM: CT HEAD WITHOUT CONTRAST TECHNIQUE: Contiguous axial images were obtained from the base of the skull through the vertex without intravenous contrast. RADIATION DOSE REDUCTION: This exam was performed according to the departmental dose-optimization program which includes automated exposure control, adjustment of the mA and/or kV according to patient size and/or use of iterative reconstruction technique. COMPARISON:  CT head 06/06/2022. FINDINGS: Brain: Similar size/appearance of a suboccipital pseudomeningocele. Sagging of the  brainstem/corpus callosum is improved, but persist. There is persistent inferior cerebellar tonsillar descent, compatible with known Chiari malformation. No visible so supratentorial extra-axial fluid collections. Persistent small infratentorial low density subdural collections. There is mild rounding of the temporal  horns and mild outward convexity of the third ventricle, new since the prior. No evidence of acute large vascular territory infarct, mass lesion, midline shift, or acute hemorrhage. Vascular: No hyperdense vessel identified. Skull: No acute fracture. Sinuses/Orbits: Clear visualized sinuses.  No acute findings. Other: No mastoid effusions. IMPRESSION: 1. Mild rounding of the temporal horns and mild outward convexity of the third ventricle, new since the prior. Findings are concerning for mild hydrocephalus. 2. Postoperative changes of craniectomy for Chiari malformation with similar suboccipital pseudomeningocele. Findings of intracranial hypotension seen on the prior are improved, but there is still some sagging of the brainstem/corpus callosum and effacement of the suprasellar cistern that is suggestive of intracranial hypotension. Electronically Signed   By: Feliberto Harts M.D.   On: 06/08/2022 08:55     Assessment/Plan:  Pre-retinal hemorrhage OS - Obscuring vessels. Instructed patient to lay with head of the bed elevated so the hemorrhage will move out of her macula and toward the bottom and out of her vision. Patient has been laying flat since Sunday- hemorrhage probably collected over her macula causing decreased vision. If hemorrhage is nonclearing or subretinal hemorrhage is seen after hemorrhage clears will consult retinal specialist for possible submacular surgery/pneumatic displacement or vitrectomy. Will dilate both eyes tomorrow to reassess hemorrhage.  Papilledema OS -History of chiari malformation s/p LP shunt and decompression. Patient had LP today with opening IOP of 29. There is usually a delay disc edema appearance and ICP. Grade 2 edema, left eye. Will dilate OD tomorrow to eval for papilledema, right eye.   LOS: 4 days   Shraga Custard T Malli Falotico 06/09/2022

## 2022-06-09 NOTE — Progress Notes (Signed)
12 Lead EKG obtained result placed in patient's chart

## 2022-06-09 NOTE — Progress Notes (Signed)
Physical Therapy Treatment Patient Details Name: Sabrina Diaz MRN: [id number] DOB: 1996/02/20 Today's Date: 06/09/2022   History of Present Illness 26 y/o female presented to ED on 06/05/22 for transfer from [facility] for unresponsive episode. 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 EEG negative. CT head concerning for mild hydrocephalus. PMH: chiari malformation s/p suboccipital decompression with shunt placement (04/2022)    PT Comments    The pt was seen for continued progression of mobility and dynamic stability. She was able to complete sit-stand transfers with less assist and progress ambulation distance without need for UE support or assist. She continues to demo slightly slowed gait with mild instability, but tolerated balance challenges such as backwards walking, quick turns, head turns, and changes in speed and direction without needing increased assist or support. Will continue to benefit from skilled PT during admission to progress dynamic stability and facilitate return to independence, will be safe to return home when medically stable.     Recommendations for follow up therapy are one component of a multi-disciplinary discharge planning process, led by the attending physician.  Recommendations may be updated based on patient status, additional functional criteria and insurance authorization.  Follow Up Recommendations  No PT follow up     Assistance Recommended at Discharge Intermittent Supervision/Assistance  Patient can return home with the following Assistance with cooking/housework;A little help with bathing/dressing/bathroom;Help with stairs or ramp for entrance   Equipment Recommendations  None recommended by PT    Recommendations for Other Services       Precautions / Restrictions Precautions Precautions: Fall Precaution Comments: low fall Restrictions Weight Bearing Restrictions: No     Mobility  Bed  Mobility Overal bed mobility: Modified Independent             General bed mobility comments: increased time to complete but no assist needed    Transfers Overall transfer level: Needs assistance Equipment used: None Transfers: Sit to/from Stand Sit to Stand: Min guard           General transfer comment: minG for safety, no assist needed to rise or steady    Ambulation/Gait Ambulation/Gait assistance: Min guard Gait Distance (Feet): 300 Feet Assistive device: None Gait Pattern/deviations: Step-through pattern, Decreased stride length, Drifts right/left Gait velocity: decreased Gait velocity interpretation: 1.31 - 2.62 ft/sec, indicative of limited community ambulator   General Gait Details: pt with slowed gait and mild instability noted. improved with continued mobility, tolerated balance challenges well   Stairs Stairs: Yes Stairs assistance: Min guard Stair Management: One rail Right, No rails, Step to pattern, Forwards Number of Stairs: 6 General stair comments: stepping up 1 stair, x3 with LLE, x3 with RLE, 4 with single rail, x2 with no UE support      Balance Overall balance assessment: Needs assistance Sitting-balance support: No upper extremity supported, Feet supported Sitting balance-Leahy Scale: Normal     Standing balance support: No upper extremity supported, During functional activity Standing balance-Leahy Scale: Good               High level balance activites: Backward walking, Direction changes, Turns, Sudden stops, Head turns High Level Balance Comments: no UE support, minG for safety but no overt LOB            Cognition Arousal/Alertness: Awake/alert Behavior During Therapy: WFL for tasks assessed/performed Overall Cognitive Status: Within Functional Limits for tasks assessed  General Comments: pt agreeable and demos good safety insight        Exercises      General  Comments General comments (skin integrity, edema, etc.): VSS on RA      Pertinent Vitals/Pain Pain Assessment Pain Assessment: Faces Faces Pain Scale: Hurts a little bit Pain Location: head Pain Descriptors / Indicators: Grimacing, Guarding Pain Intervention(s): Monitored during session, Repositioned     PT Goals (current goals can now be found in the care plan section) Acute Rehab PT Goals Patient Stated Goal: to get better PT Goal Formulation: With patient Time For Goal Achievement: 06/22/22 Potential to Achieve Goals: Good Progress towards PT goals: Progressing toward goals    Frequency    Min 3X/week      PT Plan Current plan remains appropriate       AM-PAC PT "6 Clicks" Mobility   Outcome Measure  Help needed turning from your back to your side while in a flat bed without using bedrails?: None Help needed moving from lying on your back to sitting on the side of a flat bed without using bedrails?: None Help needed moving to and from a bed to a chair (including a wheelchair)?: None Help needed standing up from a chair using your arms (e.g., wheelchair or bedside chair)?: None Help needed to walk in hospital room?: A Little Help needed climbing 3-5 steps with a railing? : A Little 6 Click Score: 22    End of Session Equipment Utilized During Treatment: Gait belt Activity Tolerance: Patient tolerated treatment well Patient left: in bed;with call bell/phone within reach Nurse Communication: Mobility status PT Visit Diagnosis: Unsteadiness on feet (R26.81)     Time: 9371-6967 PT Time Calculation (min) (ACUTE ONLY): 16 min  Charges:  $Neuromuscular Re-education: 8-22 mins                     Vickki Muff, PT, DPT   Acute Rehabilitation Department   Ronnie Derby 06/09/2022, 5:40 PM

## 2022-06-09 NOTE — Progress Notes (Signed)
Md ordered to obtained 12 lead EKG

## 2022-06-09 NOTE — Progress Notes (Signed)
Subjective: Patient reports some more headache today.  She continues to complain of decreased vision in the left eye with a large red floater.  No nausea and vomiting.  She walked 200 feet with physical therapy yesterday.  Objective: Vital signs in last 24 hours: Temp:  [97.5 F (36.4 C)-98.1 F (36.7 C)] 97.5 F (36.4 C) (07/07 0758) Pulse Rate:  [60-91] 60 (07/07 0758) Resp:  [17-21] 18 (07/07 0758) BP: (108-122)/(64-76) 113/75 (07/07 0758) SpO2:  [92 %-97 %] 94 % (07/07 0758)  Intake/Output from previous day: 07/06 0701 - 07/07 0700 In: -  Out: 1250 [Urine:1250] Intake/Output this shift: No intake/output data recorded.  Neurologic: Grossly normal, she is awake and alert.  She is playing on her phone.  She moves all extremities.  He has trouble counting more than 1 finger with her left eye and may have some loss of her nasal field on the left  Lab Results: Lab Results  Component Value Date   WBC 19.8 (H) 06/09/2022   HGB 13.0 06/09/2022   HCT 39.5 06/09/2022   MCV 89.0 06/09/2022   PLT 292 06/09/2022   No results found for: "INR", "PROTIME" BMET Lab Results  Component Value Date   NA 140 06/05/2022   K 3.8 06/05/2022   CL 107 06/05/2022   CO2 22 06/05/2022   GLUCOSE 135 (H) 06/05/2022   BUN 9 06/05/2022   CREATININE 0.70 06/05/2022   CALCIUM 8.7 (L) 06/05/2022    Studies/Results: CT HEAD WO CONTRAST ( )  Result Date: 06/08/2022 CLINICAL DATA:  Headache, chronic, new features or increased frequency EXAM: CT HEAD WITHOUT CONTRAST TECHNIQUE: Contiguous axial images were obtained from the base of the skull through the vertex without intravenous contrast. RADIATION DOSE REDUCTION: This exam was performed according to the departmental dose-optimization program which includes automated exposure control, adjustment of the mA and/or kV according to patient size and/or use of iterative reconstruction technique. COMPARISON:  CT head 06/06/2022. FINDINGS: Brain: Similar  size/appearance of a suboccipital pseudomeningocele. Sagging of the brainstem/corpus callosum is improved, but persist. There is persistent inferior cerebellar tonsillar descent, compatible with known Chiari malformation. No visible so supratentorial extra-axial fluid collections. Persistent small infratentorial low density subdural collections. There is mild rounding of the temporal horns and mild outward convexity of the third ventricle, new since the prior. No evidence of acute large vascular territory infarct, mass lesion, midline shift, or acute hemorrhage. Vascular: No hyperdense vessel identified. Skull: No acute fracture. Sinuses/Orbits: Clear visualized sinuses.  No acute findings. Other: No mastoid effusions. IMPRESSION: 1. Mild rounding of the temporal horns and mild outward convexity of the third ventricle, new since the prior. Findings are concerning for mild hydrocephalus. 2. Postoperative changes of craniectomy for Chiari malformation with similar suboccipital pseudomeningocele. Findings of intracranial hypotension seen on the prior are improved, but there is still some sagging of the brainstem/corpus callosum and effacement of the suprasellar cistern that is suggestive of intracranial hypotension. Electronically Signed   By: Feliberto Harts M.D.   On: 06/08/2022 08:55    Assessment/Plan: S/p Chiari decompression and LP shunt placement -continued headaches and white blood cell count continues to rise despite decreasing Decadron.  Cannot rule out infectious source.  We will get CT of the abdomen and pelvis to rule out pseudocyst around the catheter.  We will check urinalysis.  Check chest x-ray as her oxygen saturations are low off of oxygen.  I have also asked diagnostic radiology to perform a lumbar puncture.  I am continuing to  wean the Decadron.  Continue Diamox.  I do think her head CT is better with no extra-axial fluid collection Red floater and decreased acuity in the left eye.  No APD.   Have asked ophthalmology to see in consultation.   Estimated body mass index is 33.66 kg/m as calculated from the following:   Height as of 05/03/22: 5\' 6"  (1.676 m).   Weight as of 05/03/22: 94.6 kg.    LOS: 4 days    05/05/22 06/09/2022, 8:53 AM

## 2022-06-09 NOTE — Progress Notes (Signed)
Patient assisted to the bathroom and ambulated with little assistance. Placed on 1L/[place] due to O2 sats at 87% prior to ambulating. O2 currently at 94%. IV fluids initiated. Patient given pain medication and no other issues at this time. Jillyn Hidden, RN

## 2022-06-09 NOTE — Progress Notes (Signed)
On camera rounds, patient's tele monitor alarming "STE II, aVF" & rhythm appears to have some STE.  Bedside RN Claris Che notified.

## 2022-06-10 NOTE — Plan of Care (Signed)
  Problem: Education: Goal: Knowledge of General Education information will improve Description Including pain rating scale, medication(s)/side effects and non-pharmacologic comfort measures Outcome: Progressing   

## 2022-06-10 NOTE — Evaluation (Signed)
Occupational Therapy Evaluation Patient Details Name: Sabrina Diaz MRN: [id number] DOB: June 13, 1996 Today's Date: 06/10/2022   History of Present Illness 26 y/o female presented to ED on 06/05/22 for transfer from [facility] for unresponsive episode. 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 EEG negative. CT head concerning for mild hydrocephalus. 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 Pt was able to completed LB dressing with set up while long sitting in bed. Ms. Maultsby reported feeling weak in the legs when ambulating and when first going from sit to stand at this time. Pt was able to ambulate with no DME and was educated on perception and navigating uneven surfaces to decrease in risk of falls with the return to home. Pt currently with functional limitations due to the deficits listed below (see OT Problem List).  Pt will benefit from skilled OT to increase their safety and independence with ADL and functional mobility for ADL to facilitate discharge to venue listed below.        Recommendations for follow up therapy are one component of a multi-disciplinary discharge planning process, led by the attending physician.  Recommendations may be updated based on patient status, additional functional criteria and insurance authorization.   Follow Up Recommendations  No OT follow up    Assistance Recommended at Discharge Set up Supervision/Assistance  Patient can return home with the following Assist for transportation    Functional Status Assessment  Patient has had a recent decline in their functional status and demonstrates the ability to make significant improvements in function in a reasonable and predictable amount of time.  Equipment Recommendations  None recommended by OT     Recommendations for Other Services       Precautions / Restrictions Precautions Precautions: Fall Precaution Comments: low fall Restrictions Weight Bearing Restrictions: No      Mobility Bed Mobility Overal bed mobility: Modified Independent             General bed mobility comments: increase in time    Transfers Overall transfer level: Needs assistance Equipment used: None Transfers: Sit to/from Stand Sit to Stand: Min guard           General transfer comment: Pt reported feeling a little shaky when first standing up      Balance Overall balance assessment: Needs assistance Sitting-balance support: No upper extremity supported, Feet supported Sitting balance-Leahy Scale: Normal     Standing balance support: No upper extremity supported, During functional activity Standing balance-Leahy Scale: Good                             ADL either performed or assessed with clinical judgement   ADL Overall ADL's : Needs assistance/impaired Eating/Feeding: Independent;Sitting   Grooming: Wash/dry hands;Wash/dry face;Supervision/safety;Standing   Upper Body Bathing: Set up;Sitting   Lower Body Bathing: Set up;Sit to/from stand   Upper Body Dressing : Set up;Sitting   Lower Body Dressing: Set up;Sit to/from stand   Toilet Transfer: Supervision/safety;Cueing for safety;Cueing for sequencing   Toileting- Clothing Manipulation and Hygiene: Supervision/safety;Sit to/from stand       Functional mobility during ADLs: Supervision/safety;Min guard       Vision Ability to See in Adequate Light:  (Pt reported they are still feeling blurry vision but also due to drops placed in  eyes) Patient Visual Report: Blurring of vision       Perception     Praxis      Pertinent Vitals/Pain Pain Assessment Pain Assessment: 0-10 Pain Score: 4  Pain Location: head Pain Descriptors / Indicators: Dull Pain Intervention(s): Limited activity within patient's  tolerance, Monitored during session, Premedicated before session     Hand Dominance Right   Extremity/Trunk Assessment Upper Extremity Assessment Upper Extremity Assessment: Overall WFL for tasks assessed   Lower Extremity Assessment Lower Extremity Assessment: Defer to PT evaluation       Communication Communication Communication: No difficulties   Cognition Arousal/Alertness: Awake/alert Behavior During Therapy: WFL for tasks assessed/performed Overall Cognitive Status: Within Functional Limits for tasks assessed                                       General Comments       Exercises     Shoulder Instructions      Home Living Family/patient expects to be discharged to:: Private residence Living Arrangements: Spouse/significant other;Parent Available Help at Discharge: Family Type of Home: House Home Access: Stairs to enter Secretary/administrator of Steps: 3 Entrance Stairs-Rails: Right;Left;Can reach both Home Layout: One level     Bathroom Shower/Tub: Chief Strategy Officer: Standard     Home Equipment: Grab bars - tub/shower          Prior Functioning/Environment Prior Level of Function : Independent/Modified Independent             Mobility Comments: independent ADLs Comments: Family assisted with LE washing and dressing.        OT Problem List: Impaired balance (sitting and/or standing);Decreased activity tolerance;Decreased knowledge of use of DME or AE;Pain      OT Treatment/Interventions: Self-care/ADL training;DME and/or AE instruction;Energy conservation;Therapeutic activities;Patient/family education;Balance training    OT Goals(Current goals can be found in the care plan section) Acute Rehab OT Goals Patient Stated Goal: to get back my strength OT Goal Formulation: With patient Time For Goal Achievement: 06/24/22 Potential to Achieve Goals: Good  OT Frequency: Min 2X/week    Co-evaluation               AM-PAC OT "6 Clicks" Daily Activity     Outcome Measure Help from another person eating meals?: None Help from another person taking care of personal grooming?: None Help from another person toileting, which includes using toliet, bedpan, or urinal?: A Little Help from another person bathing (including washing, rinsing, drying)?: A Little Help from another person to put on and taking off regular upper body clothing?: None Help from another person to put on and taking off regular lower body clothing?: None 6 Click Score: 22   End of Session Equipment Utilized During Treatment: Gait belt  Activity Tolerance: Patient tolerated treatment well Patient left: in bed;with call bell/phone within reach;with bed alarm set  OT Visit Diagnosis: Unsteadiness on feet (R26.81);Other abnormalities of gait and mobility (R26.89);Pain Pain - part of body:  (head)                Time: 8786-7672 OT Time Calculation (min): 17 min Charges:  OT General Charges $OT Visit: 1 Visit OT Evaluation $OT Eval Low Complexity: 1 Low  Alphia Moh OTR/L  Acute Rehab Services  [phone] office number [phone] pager number   Alphia Moh 06/10/2022, 10:30 AM

## 2022-06-10 NOTE — Progress Notes (Signed)
Doing some better today.  Patient reports less headache.  No other complaints.  Her vision is somewhat improved.  She is afebrile.  Her vital signs are stable.  She is awake and alert.  She is oriented and appropriate.  Her speech is fluent.  Motor and sensory function are intact.  Abdomen is soft.  LP yesterday with no evidence of bacteria on Gram stain.  There are few white cells.  Cultures no growth so far.  CT scan of the abdomen demonstrates good positioning of her LP shunt.  Overall slowly improving following shunt valve adjustment.  Begin to slowly mobilize.  Greatly appreciate ophthalmology's assistant with regard to her retinal hemorrhage.

## 2022-06-10 NOTE — Progress Notes (Signed)
OPHTHALMOLOGY PROGRESS NOTE  Subjective: Patient is a 26 year-old female with a history of chiari malformation s/p decompression and LP shunt who was found with unresponsive on Monday 07/03. Ophthalmology was consulted due to decreased vision.   This morning patient reports that she has been laying at an incline since I last saw her. Reports vision is still blurry.  Objective: Vital signs in last 24 hours: Temp:  [97.5 F (36.4 C)-98 F (36.7 C)] 98 F (36.7 C) (07/08 0316) Pulse Rate:  [67-93] 67 (07/08 0316) Resp:  [14-20] 16 (07/08 0316) BP: (110-137)/(67-75) 110/72 (07/08 0316) SpO2:  [96 %-97 %] 96 % (07/08 0316) Weight change:  Last BM Date :  (PTA)  Intake/Output from previous day: 07/07 0701 - 07/08 0700 In: 240 [P.O.:240] Out: -  Intake/Output this shift: No intake/output data recorded.  Base Eye Exam       Visual Acuity (ETDRS)     Right Left  Dist [place] 20/40    Dist ph [place] CF      Tonometry (Tonopen)     Right Left  Pressure 18 17   Pupils     APD  Right None  Left None    Visual Fields     Right Left    Full      Extraocular Movement     Right Left    Full Full    Neuro/Psych   Oriented x3: Yes  Mood/Affect: Normal              Slit Lamp and Fundus Exam     External Exam     Right Left  External Normal Normal    Slit Lamp Exam     Right Left  Lids/Lashes Normal Normal  Conjunctiva/Sclera White and quiet White and quiet  Cornea Clear Clear  Anterior Chamber Deep and quiet Deep and quiet  Iris Grossly normal Grossly normal  Lens               Fundus Exam (deferred dilation in the right eye to preserve pupil/APD exam)     Right Clear  Posterior Vitreous Clear Clear  Disc Pink, sharp margins, tilted, slightly elevated nasal Grade 1 papilledema, no obscuration of vessels.  C/D Ratio 0.1 0.1  Macula Flat, good reflex, no hemorrhages. Less dense pre-retinal hemorrhage extending from optic disc to temporal macula. Obscures  temporal optic disc margin and inferior arcade. Smaller pre-retinal hemorrhage inferior obscuring retinal vessels. Located more inferiorly.  Vessels Normal course and caliber Normal course and caliber  Periphery Attached Attached    Tropicamide/Phenylephrine drop into both eyes at 8:15 am  Recent Labs    06/07/22 0903 06/09/22 0710  WBC 18.9* 19.8*  HGB 12.4 13.0  HCT 37.3 39.5    Studies/Results: CT ABDOMEN PELVIS WO CONTRAST  Result Date: 06/09/2022 CLINICAL DATA:  Acute abdominal pain. EXAM: CT ABDOMEN AND PELVIS WITHOUT CONTRAST TECHNIQUE: Multidetector CT imaging of the abdomen and pelvis was performed following the standard protocol without IV contrast. RADIATION DOSE REDUCTION: This exam was performed according to the departmental dose-optimization program which includes automated exposure control, adjustment of the mA and/or kV according to patient size and/or use of iterative reconstruction technique. COMPARISON:  05/05/2022 FINDINGS: Lower chest: Unremarkable. Hepatobiliary: No suspicious focal abnormality in the liver on this study without intravenous contrast. There is no evidence for gallstones, gallbladder wall thickening, or pericholecystic fluid. No intrahepatic or extrahepatic biliary dilation. Pancreas: No focal mass lesion. No dilatation of the main duct. No intraparenchymal  cyst. No peripancreatic edema. Spleen: No splenomegaly. No focal mass lesion. Adrenals/Urinary Tract: No adrenal nodule or mass. Right kidney and ureter are unremarkable. 2 mm nonobstructing stone identified interpolar left kidney. No left ureteral stone. No secondary changes in the left kidney or ureter. The urinary bladder appears normal for the degree of distention. Stomach/Bowel: Stomach is unremarkable. No gastric wall thickening. No evidence of outlet obstruction. Duodenum is normally positioned as is the ligament of Treitz. No small bowel wall thickening. No small bowel dilatation. The terminal ileum  is normal. The appendix is not well visualized, but there is no edema or inflammation in the region of the cecum. No gross colonic mass. No colonic wall thickening. Vascular/Lymphatic: No abdominal aortic aneurysm. No abdominal aortic atherosclerotic calcification. There is no gastrohepatic or hepatoduodenal ligament lymphadenopathy. No retroperitoneal or mesenteric lymphadenopathy. No pelvic sidewall lymphadenopathy. Reproductive: The uterus is unremarkable.  There is no adnexal mass. Other: Shunt tubing is seen in the pelvis. No intraperitoneal free fluid. Musculoskeletal: No worrisome lytic or sclerotic osseous abnormality. IMPRESSION: 1. No acute findings in the abdomen or pelvis. Specifically, no findings to explain the patient's history of abdominal pain. 2. 2 mm nonobstructing left renal stone. Electronically Signed   By: Kennith Center M.D.   On: 06/09/2022 12:20   DG FL GUIDED LUMBAR PUNCTURE  Result Date: 06/09/2022 CLINICAL DATA:  Status post Chiari decompression and LP shunt placement, continued headaches, rising white blood cell count, decreased vision in left eye. EXAM: DIAGNOSTIC LUMBAR PUNCTURE UNDER FLUOROSCOPIC GUIDANCE COMPARISON:  Fluoroscopically-guided lumbar puncture 04/28/2022 FLUOROSCOPY: Fluoroscopy time: 48 seconds (13.60 mGy). PROCEDURE: Prior to the procedure, Alwyn Ren, NP obtained informed consent from the patient. This process included a discussion of the procedural risks. The patient was positioned prone on the fluoroscopy table. A skin entry site was determined under fluoroscopy and marked. The operator donned sterile gloves and a mask. The lower back was prepped with Betadine and draped. 1% Lidocaine was used for local anesthesia. Lumbar puncture was performed at the L2-L3 level using a 20-gauge needle with return of clear CSF with an opening pressure of 29 cm water. 12 mL of CSF were obtained for laboratory studies. Closing pressure: 18 cm water. The patient tolerated the  procedure well, and no immediate post-procedure complication was apparent. The procedure was performed by Alwyn Ren, NP, and was supervised and interpreted by Dr. Jackey Loge. IMPRESSION: 1. Technically successful fluoroscopically-guided L2-L3 lumbar puncture. 2. Opening pressure: 29 cm water. 3. 12 mL of CSF obtained for laboratory studies. 4. Closing pressure: 12 cm water. Electronically Signed   By: Jackey Loge D.O.   On: 06/09/2022 11:51   DG Chest 2 View  Result Date: 06/09/2022 CLINICAL DATA:  Shortness of breath and chest pain. EXAM: CHEST - 2 VIEW COMPARISON:  None Available. FINDINGS: The lungs are clear without focal pneumonia, edema, pneumothorax or pleural effusion. The cardiopericardial silhouette is within normal limits for size. The visualized bony structures of the thorax are unremarkable. Telemetry leads overlie the chest. IMPRESSION: No active cardiopulmonary disease. Electronically Signed   By: Kennith Center M.D.   On: 06/09/2022 10:09   CT HEAD WO CONTRAST ( )  Result Date: 06/08/2022 CLINICAL DATA:  Headache, chronic, new features or increased frequency EXAM: CT HEAD WITHOUT CONTRAST TECHNIQUE: Contiguous axial images were obtained from the base of the skull through the vertex without intravenous contrast. RADIATION DOSE REDUCTION: This exam was performed according to the departmental dose-optimization program which includes automated exposure control, adjustment of  the mA and/or kV according to patient size and/or use of iterative reconstruction technique. COMPARISON:  CT head 06/06/2022. FINDINGS: Brain: Similar size/appearance of a suboccipital pseudomeningocele. Sagging of the brainstem/corpus callosum is improved, but persist. There is persistent inferior cerebellar tonsillar descent, compatible with known Chiari malformation. No visible so supratentorial extra-axial fluid collections. Persistent small infratentorial low density subdural collections. There is mild rounding of  the temporal horns and mild outward convexity of the third ventricle, new since the prior. No evidence of acute large vascular territory infarct, mass lesion, midline shift, or acute hemorrhage. Vascular: No hyperdense vessel identified. Skull: No acute fracture. Sinuses/Orbits: Clear visualized sinuses.  No acute findings. Other: No mastoid effusions. IMPRESSION: 1. Mild rounding of the temporal horns and mild outward convexity of the third ventricle, new since the prior. Findings are concerning for mild hydrocephalus. 2. Postoperative changes of craniectomy for Chiari malformation with similar suboccipital pseudomeningocele. Findings of intracranial hypotension seen on the prior are improved, but there is still some sagging of the brainstem/corpus callosum and effacement of the suprasellar cistern that is suggestive of intracranial hypotension. Electronically Signed   By: Margaretha Sheffield M.D.   On: 06/08/2022 08:55    Assessment/Plan:  Pre-retinal hemorrhage OS - Obscuring vessels. Instructed patient to lay with head of the bed elevated so the hemorrhage will move out of her macula and toward the bottom and out of her vision. Patient has been laying flat since Sunday- hemorrhage probably collected over her macula causing decreased vision. If hemorrhage is nonclearing or subretinal hemorrhage is seen after hemorrhage clears will consult retinal specialist for possible submacular surgery/pneumatic displacement or vitrectomy. Will dilate both eyes tomorrow to reassess hemorrhage.  -Vision improved slightly. Hemorrhage tracking more inferior and less dense in the macula, most superior edge is at the fovea. Expect vision to improve significantly once fovea is cleared. Does not appear to have subretinal component. Encouraged to continue laying at an incline. Will return tomorrow or Monday to reassess.   Papilledema OS -History of chiari malformation s/p LP shunt and decompression. Patient had LP today with  opening IOP of 29. There is usually a delay disc edema appearance and ICP. Grade 2 edema, left eye. Will dilate OD tomorrow to eval for papilledema, right eye.  -Tilted and elevated optic disc nasal but margins sharp OD.  -If patient gets discharged before Monday, please call our office for follow up appointment. Follow up with Dr. Arnoldo Hooker at [facility] Address: [address], [place] [address] Phone: [phone]. Fax: [phone]     LOS: 5 days   Kathlyne Loud T Dovey Fatzinger 06/10/2022

## 2022-06-11 LAB — CSF CELL COUNT WITH DIFFERENTIAL
Eosinophils, CSF: 0 % (ref 0–1)
Lymphs, CSF: 80 % (ref 40–80)
Monocyte-Macrophage-Spinal Fluid: 13 % — ABNORMAL LOW (ref 15–45)
RBC Count, CSF: 78 /mm3 — ABNORMAL HIGH
Segmented Neutrophils-CSF: 7 % — ABNORMAL HIGH (ref 0–6)
Tube #: 1
WBC, CSF: 13 /mm3 (ref 0–5)

## 2022-06-11 MED ORDER — POLYETHYLENE GLYCOL 3350 17 G PO PACK
17.0000 g | PACK | Freq: Every day | ORAL | Status: DC | PRN
  Administered 2022-06-11: 17 g via ORAL
  Filled 2022-06-11: qty 1

## 2022-06-11 MED ORDER — BISACODYL 10 MG RE SUPP: 10.0000 mg | Freq: Every day | RECTAL | Status: DC | PRN

## 2022-06-11 MED ORDER — FLEET ENEMA 7-19 GM/118ML RE ENEM: 1.0000 | ENEMA | Freq: Every day | RECTAL | Status: DC | PRN

## 2022-06-11 NOTE — Progress Notes (Signed)
Patient with increased headache today.  She complains of swelling in the posterior aspect of her neck and head.She is having no fevers.  She is awake and alert.  She is obviously uncomfortable.  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  Her abdomen is soft.  She complains that she still has not had a bowel movement.  CSF cultures remain negative.  Her LP shunt appears to be well-positioned both within her spinal canal and abdomen however she still has symptoms of increasing intracranial pressure today.  I would like to do a follow-up lumbar puncture to confirm increased intracranial pressure and see if therapeutic drainage once again is helped with her headaches.  If this is the case the patient may benefit from LP shunt revision or possible placement of VP shunt.

## 2022-06-11 NOTE — Op Note (Signed)
Date of procedure: 06/11/2022  Date of dictation: Same  Service: Neurosurgery  Preoperative diagnosis: Intracranial hypertension/shunt malfunction  Postoperative diagnosis: Same   Procedure Name: lumbar puncture, therapeutic and diagnostic  Surgeon:Dionisia Pacholski A.Christmas Faraci, M.D.  Asst. Surgeon: None  Anesthesia: General  Indication: 26 year old female status post Chiari malformation decompression with persistent headaches and pseudomeningocele requiring lumbar peritoneal shunting.  Patient now with increased headache and worsening pseudomeningocele again.  She presents for lumbar puncture for diagnostics and hopeful relief of some of her headache.  Operative note: Patient was positioned in the left lateral decubitus position.  Lumbar region was prepped and draped sterilely.  Local lidocaine was infiltrated in the skin.  20-gauge spinal needle was then passed into the lumbar subarachnoid space.  Opening pressure was 32 cm of water.  Approximately 20 cc of clear CSF was drained.  Collections were sent to the lab for cell count and culture.  Closing pressure was 12 cm of water.  Patient tolerated the procedure well.

## 2022-06-11 NOTE — Progress Notes (Signed)
Mobility Specialist Progress Note:   06/11/22 0907  Mobility  Activity Ambulated with assistance in hallway  Level of Assistance Independent after set-up  Assistive Device None  Distance Ambulated (ft) 700 ft  Activity Response Tolerated well  $Mobility charge 1 Mobility   Pt received in bed willing to participate in mobility. Complaints of a little headache. Left in bed with call bell in reach and all needs met.   [place] Day Mobility Specialist  

## 2022-06-12 LAB — CSF CULTURE W GRAM STAIN: Culture: NO GROWTH

## 2022-06-12 LAB — CREATININE, SERUM
Creatinine, Ser: 0.78 mg/dL (ref 0.44–1.00)
GFR, Estimated: >60 mL/min (ref >60)

## 2022-06-12 LAB — PATHOLOGIST SMEAR REVIEW

## 2022-06-12 MED ORDER — DEXAMETHASONE 4 MG PO TABS
2.0000 mg | ORAL_TABLET | Freq: Four times a day (QID) | ORAL | Status: DC
  Administered 2022-06-12 (×3): 2 mg via ORAL
  Filled 2022-06-12 (×4): qty 1

## 2022-06-12 MED ORDER — DIPHENHYDRAMINE HCL 25 MG PO CAPS
25.0000 mg | ORAL_CAPSULE | Freq: Four times a day (QID) | ORAL | Status: DC | PRN
Start: 2022-06-12 — End: 2022-06-16
  Administered 2022-06-12 – 2022-06-13 (×3): 25 mg via ORAL
  Filled 2022-06-12 (×3): qty 1

## 2022-06-12 MED ORDER — ACETAZOLAMIDE 250 MG PO TABS
500.0000 mg | ORAL_TABLET | Freq: Two times a day (BID) | ORAL | Status: DC
  Administered 2022-06-12 – 2022-06-13 (×2): 500 mg via ORAL
  Filled 2022-06-12 (×4): qty 2

## 2022-06-12 MED ORDER — HEPARIN SODIUM (PORCINE) 5000 UNIT/ML IJ SOLN
5000.0000 [IU] | Freq: Three times a day (TID) | INTRAMUSCULAR | Status: DC
  Administered 2022-06-12 (×2): 5000 [IU] via SUBCUTANEOUS
  Filled 2022-06-12 (×2): qty 1

## 2022-06-12 MED ORDER — DEXAMETHASONE SODIUM PHOSPHATE 4 MG/ML IJ SOLN: 2.0000 mg | Freq: Four times a day (QID) | INTRAMUSCULAR | Status: DC

## 2022-06-12 NOTE — Progress Notes (Signed)
Ophthalmology Progress NOte  Subjective: Pt reports VA not improved. Went from red vision to darker black.   Objective: Vital signs in last 24 hours: Temp:  [97.5 F (36.4 C)-98.5 F (36.9 C)] 98.2 F (36.8 C) (07/10 1523) Pulse Rate:  [77-102] 78 (07/10 1523) Resp:  [18-20] 19 (07/10 1523) BP: (96-133)/(72-83) 128/80 (07/10 1523) SpO2:  [95 %-98 %] 97 % (07/10 1523) Weight change:  Last BM Date : 06/09/22  Intake/Output from previous day: 07/09 0701 - 07/10 0700 In: 922.9 [P.O.:600; I.V.:322.9] Out: 775 [Urine:775] Intake/Output this shift: No intake/output data recorded.  Base Eye Exam          Visual Acuity (ETDRS)     Right Left  [place] 20/20 bare CF         Tonometry (Tonopen)     Right Left  Pressure 18 18   Pupils     APD  Right None  Left None    Visual Fields     Right Left    Full      Extraocular Movement     Right Left    Full Full    Neuro/Psych   Oriented x3: Yes  Mood/Affect: Normal                Slit Lamp and Fundus Exam     External Exam     Right Left  External Normal Normal    Slit Lamp Exam     Right Left  Lids/Lashes Normal Normal  Conjunctiva/Sclera White and quiet White and quiet  Cornea Clear Clear  Anterior Chamber Deep and quiet Deep and quiet  Iris Grossly normal Grossly normal  Lens               Fundus Exam (deferred dilation in the right eye to preserve pupil/APD exam)     Right Clear  Posterior Vitreous  Clear  Disc  Grade 1 papilledema, no obscuration of vessels.  C/D Ratio  0.1  Macula  Less dense pre-retinal hemorrhage extending from optic disc to temporal macula. Obscures temporal optic disc margin and inferior arcade and now extending to superior arcade. Smaller pre-retinal hemorrhage inferior obscuring retinal vessels, moving more superiorly.  Vessels  Normal course and caliber  Periphery  Attached      Recent Labs    06/12/22 0929  CREATININE 0.78    Studies/Results: No results  found.  Assessment/Plan:   Pre-retinal hemorrhage OS - Obscuring vessels. Instructed patient to lay with head of the bed elevated so the hemorrhage will move out of her macula and toward the bottom and out of her vision. Patient has been laying flat since Sunday- hemorrhage probably collected over her macula causing decreased vision. If hemorrhage is nonclearing or subretinal hemorrhage is seen after hemorrhage clears will consult retinal specialist for possible submacular surgery/pneumatic displacement or vitrectomy. Will dilate both eyes tomorrow to reassess hemorrhage.   -Vision improved slightly. Hemorrhage tracking more inferior and less dense in the macula, most superior edge is at the fovea. Expect vision to improve significantly once fovea is cleared. Does not appear to have subretinal component. Encouraged to continue laying at an incline. Will return tomorrow or Monday to reassess.  -Vision worsened. Did report that she had to lay flat for LP. Hemorrhage spread more superior in macula. If does not improve by tomorrow (07/11), will notify retina to eval for surgery.   Papilledema OS -History of chiari malformation s/p LP shunt and decompression. Patient had LP today with  opening IOP of 29. There is usually a delay disc edema appearance and ICP. Grade 2 edema, left eye. Will dilate OD tomorrow to eval for papilledema, right eye.   -Tilted and elevated optic disc nasal but margins sharp OD.   -If patient gets discharged before Monday, please call our office for follow up appointment. Follow up with Dr. Sharyn Dross at [facility] Address: [address] [place], [place] [address] Phone: [phone]. Fax: [phone]    LOS: 7 days   An Sabrina Diaz 06/12/2022

## 2022-06-12 NOTE — Plan of Care (Signed)

## 2022-06-12 NOTE — Progress Notes (Signed)
Physical Therapy Treatment Patient Details Name: Sabrina Diaz MRN: [id number] DOB: 23-Dec-1995 Today's Date: 06/12/2022   History of Present Illness 26 y/o female presented to ED on 06/05/22 for transfer from [facility] for unresponsive episode. 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 EEG negative. CT head concerning for mild hydrocephalus. S/p lumbar puncture 7/9. 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 She continues to be mobilizing on supervision level in the room, but needed up to minA to correct LOB with hallway ambulation and balance challenges. The pt struggles most with narrow BOS and with lateral movement of her head (even with turning) at this time. Will continue to benefit from skilled PT to progress functional mobility and stability during admission.     Recommendations for follow up therapy are one component of a multi-disciplinary discharge planning process, led by the attending physician.  Recommendations may be updated based on patient status, additional functional criteria and insurance authorization.  Follow Up Recommendations  No PT follow up     Assistance Recommended at Discharge Intermittent Supervision/Assistance  Patient can return home with the following Assistance with cooking/housework;A little help with bathing/dressing/bathroom;Help with stairs or ramp for entrance   Equipment Recommendations  None recommended by PT    Recommendations for Other Services       Precautions / Restrictions Precautions Precautions: Fall Precaution Comments: low fall Restrictions Weight Bearing Restrictions: No     Mobility  Bed Mobility Overal bed mobility: Modified Independent             General bed mobility comments: increased time    Transfers Overall transfer level:  Needs assistance Equipment used: None Transfers: Sit to/from Stand Sit to Stand: Min guard           General transfer comment: minG for safety, pt feels a little shaky and keeping eyes closed at times    Ambulation/Gait Ambulation/Gait assistance: Min guard, Min assist Gait Distance (Feet): 400 Feet Assistive device: None Gait Pattern/deviations: Step-through pattern, Decreased stride length, Staggering right Gait velocity: decreased     General Gait Details: single episode of LOB to R with regular walking, minA with balance challenges maintains head in lateral flexion to L       Balance Overall balance assessment: Needs assistance Sitting-balance support: No upper extremity supported, Feet supported Sitting balance-Leahy Scale: Normal     Standing balance support: No upper extremity supported, During functional activity Standing balance-Leahy Scale: Fair Standing balance comment: minA at times with balance challenges             High level balance activites: Backward walking, Direction changes, Turns, Sudden stops, Other (comment) (tandem walking) High Level Balance Comments: minA with tandem walking, increased instability with quick turns            Cognition Arousal/Alertness: Awake/alert Behavior During Therapy: WFL for tasks assessed/performed Overall Cognitive Status: Within Functional Limits for tasks assessed                                 General Comments: pt agreeable and demos good safety insight        Exercises      General Comments General comments (skin integrity, edema, etc.): family present and supportive.reports hed throbbing, nausea. new rash on chest and back,  MD and RN aware, pt has been started on Benadryl      Pertinent Vitals/Pain Pain Assessment Pain Assessment: No/denies pain Faces Pain Scale: Hurts even more Pain Location: head Pain Descriptors / Indicators: Dull, Throbbing Pain Intervention(s): Limited  activity within patient's tolerance, Monitored during session, Repositioned, RN gave pain meds during session     PT Goals (current goals can now be found in the care plan section) Acute Rehab PT Goals Patient Stated Goal: to get better PT Goal Formulation: With patient Time For Goal Achievement: 06/22/22 Potential to Achieve Goals: Good Progress towards PT goals: Progressing toward goals    Frequency    Min 3X/week      PT Plan Current plan remains appropriate       AM-PAC PT "6 Clicks" Mobility   Outcome Measure  Help needed turning from your back to your side while in a flat bed without using bedrails?: None Help needed moving from lying on your back to sitting on the side of a flat bed without using bedrails?: None Help needed moving to and from a bed to a chair (including a wheelchair)?: None Help needed standing up from a chair using your arms (e.g., wheelchair or bedside chair)?: None Help needed to walk in hospital room?: A Little Help needed climbing 3-5 steps with a railing? : A Little 6 Click Score: 22    End of Session Equipment Utilized During Treatment: Gait belt   Patient left: in bed;with call bell/phone within reach Nurse Communication: Mobility status PT Visit Diagnosis: Unsteadiness on feet (R26.81)     Time: 3382-5053 PT Time Calculation (min) (ACUTE ONLY): 26 min  Charges:  $Therapeutic Activity: 8-22 mins $Neuromuscular Re-education: 8-22 mins                     Vickki Muff, PT, DPT   Acute Rehabilitation Department   Ronnie Derby 06/12/2022, 1:52 PM

## 2022-06-12 NOTE — Progress Notes (Signed)
She feels somewhat better today after having a spinal tap yesterday.  Left eye vision is very similar.  Ophthalmology may be back today.  No nausea and vomiting.  No numbness tingling or weakness.  She is awake and alert and pleasant and conversive with a nonfocal motor and sensory exam.  With 2 LPs showing a high opening pressure it is quite obvious now she has intracranial hypertension or hydrocephalus.  Obviously, her lumboperitoneal shunt is not working efficiently.  I have recommended ventriculoperitoneal shunt.  We will plan on a CT scan of the head tomorrow with BrainLab protocol for surgical planning.  I will discuss this with Dr. Maurice Small tonight and may be can do this for her tomorrow.  She will be n.p.o. after midnight.  I did start heparin for DVT prophylaxis today but will stop that before surgery.  Also increased her Diamox to 500 twice daily.  She does have a diffuse red rash on the chest and the back it potentially could be contact dermatitis or drug eruption.  We will start some Benadryl.

## 2022-06-13 ENCOUNTER — Inpatient Hospital Stay (HOSPITAL_COMMUNITY): Payer: 59

## 2022-06-13 ENCOUNTER — Encounter (HOSPITAL_COMMUNITY): Payer: Self-pay | Admitting: Neurological Surgery

## 2022-06-13 ENCOUNTER — Encounter (HOSPITAL_COMMUNITY)
Admission: EM | Disposition: A | Discharge status: Home / Self Care | Payer: Self-pay | Source: Home / Self Care | Attending: Neurological Surgery

## 2022-06-13 ENCOUNTER — Other Ambulatory Visit: Payer: Self-pay

## 2022-06-13 ENCOUNTER — Inpatient Hospital Stay (HOSPITAL_COMMUNITY): Payer: 59 | Admitting: Certified Registered Nurse Anesthetist

## 2022-06-13 DIAGNOSIS — G919 Hydrocephalus, unspecified: Secondary | ICD-10-CM

## 2022-06-13 HISTORY — PX: VENTRICULOPERITONEAL SHUNT: SHX204

## 2022-06-13 HISTORY — PX: APPLICATION OF CRANIAL NAVIGATION: SHX6578

## 2022-06-13 HISTORY — PX: SHUNT REMOVAL: SHX342

## 2022-06-13 LAB — POCT PREGNANCY, URINE: Preg Test, Ur: NEGATIVE

## 2022-06-13 LAB — TYPE AND SCREEN
ABO/RH(D): O POS
Antibody Screen: NEGATIVE

## 2022-06-13 SURGERY — SHUNT INSERTION VENTRICULAR-PERITONEAL
Anesthesia: General | Laterality: Right

## 2022-06-13 MED ORDER — CEFAZOLIN SODIUM-DEXTROSE 2-4 GM/100ML-% IV SOLN
2.0000 g | INTRAVENOUS | Status: AC
  Administered 2022-06-13: 2 g via INTRAVENOUS
  Filled 2022-06-13: qty 100

## 2022-06-13 MED ORDER — ONDANSETRON HCL 4 MG/2ML IJ SOLN
INTRAMUSCULAR | Status: AC
  Filled 2022-06-13: qty 2

## 2022-06-13 MED ORDER — ONDANSETRON HCL 4 MG/2ML IJ SOLN
INTRAMUSCULAR | Status: DC | PRN
  Administered 2022-06-13: 4 mg via INTRAVENOUS

## 2022-06-13 MED ORDER — ACETAMINOPHEN 10 MG/ML IV SOLN
INTRAVENOUS | Status: DC | PRN
  Administered 2022-06-13: 1000 mg via INTRAVENOUS

## 2022-06-13 MED ORDER — ROCURONIUM BROMIDE 10 MG/ML (PF) SYRINGE
PREFILLED_SYRINGE | INTRAVENOUS | Status: DC | PRN
  Administered 2022-06-13: 40 mg via INTRAVENOUS
  Administered 2022-06-13: 100 mg via INTRAVENOUS

## 2022-06-13 MED ORDER — LIDOCAINE-EPINEPHRINE 1 %-1:100000 IJ SOLN
INTRAMUSCULAR | Status: AC
  Filled 2022-06-13: qty 1

## 2022-06-13 MED ORDER — LIDOCAINE 2% (20 MG/ML) 5 ML SYRINGE
INTRAMUSCULAR | Status: AC
  Filled 2022-06-13: qty 5

## 2022-06-13 MED ORDER — MIDAZOLAM HCL 2 MG/2ML IJ SOLN
INTRAMUSCULAR | Status: DC | PRN
  Administered 2022-06-13: 2 mg via INTRAVENOUS

## 2022-06-13 MED ORDER — LIDOCAINE-EPINEPHRINE 1 %-1:100000 IJ SOLN
INTRAMUSCULAR | Status: DC | PRN
  Administered 2022-06-13: 20 mL
  Administered 2022-06-13: 10 mL

## 2022-06-13 MED ORDER — BACITRACIN ZINC 500 UNIT/GM EX OINT
TOPICAL_OINTMENT | CUTANEOUS | Status: AC
  Filled 2022-06-13: qty 28.35

## 2022-06-13 MED ORDER — THROMBIN 5000 UNITS EX SOLR
CUTANEOUS | Status: AC
  Filled 2022-06-13: qty 5000

## 2022-06-13 MED ORDER — DEXAMETHASONE SODIUM PHOSPHATE 10 MG/ML IJ SOLN
INTRAMUSCULAR | Status: AC
Start: 2022-06-13 — End: ?
  Filled 2022-06-13: qty 1

## 2022-06-13 MED ORDER — ROCURONIUM BROMIDE 10 MG/ML (PF) SYRINGE
PREFILLED_SYRINGE | INTRAVENOUS | Status: AC
Start: 2022-06-13 — End: ?
  Filled 2022-06-13: qty 20

## 2022-06-13 MED ORDER — PROPOFOL 10 MG/ML IV BOLUS
INTRAVENOUS | Status: DC | PRN
  Administered 2022-06-13: 40 mg via INTRAVENOUS
  Administered 2022-06-13: 60 mg via INTRAVENOUS
  Administered 2022-06-13: 150 mg via INTRAVENOUS
  Administered 2022-06-13 (×2): 30 mg via INTRAVENOUS
  Administered 2022-06-13: 20 mg via INTRAVENOUS
  Administered 2022-06-13: 30 mg via INTRAVENOUS
  Administered 2022-06-13 (×2): 50 mg via INTRAVENOUS

## 2022-06-13 MED ORDER — ESMOLOL HCL 100 MG/10ML IV SOLN
INTRAVENOUS | Status: DC | PRN
  Administered 2022-06-13: 10 mg via INTRAVENOUS

## 2022-06-13 MED ORDER — PROPOFOL 10 MG/ML IV BOLUS
INTRAVENOUS | Status: AC
  Filled 2022-06-13: qty 20

## 2022-06-13 MED ORDER — DEXAMETHASONE SODIUM PHOSPHATE 10 MG/ML IJ SOLN
INTRAMUSCULAR | Status: DC | PRN
  Administered 2022-06-13: 10 mg via INTRAVENOUS

## 2022-06-13 MED ORDER — ACETAMINOPHEN 10 MG/ML IV SOLN
INTRAVENOUS | Status: AC
  Filled 2022-06-13: qty 100

## 2022-06-13 MED ORDER — SODIUM CHLORIDE 0.9 % IV SOLN: INTRAVENOUS | Status: DC | PRN

## 2022-06-13 MED ORDER — FENTANYL CITRATE (PF) 100 MCG/2ML IJ SOLN
25.0000 ug | INTRAMUSCULAR | Status: DC | PRN
  Administered 2022-06-13 (×2): 50 ug via INTRAVENOUS

## 2022-06-13 MED ORDER — MIDAZOLAM HCL 2 MG/2ML IJ SOLN
INTRAMUSCULAR | Status: AC
  Filled 2022-06-13: qty 2

## 2022-06-13 MED ORDER — THROMBIN 5000 UNITS EX SOLR: OROMUCOSAL | Status: DC | PRN

## 2022-06-13 MED ORDER — CHLORHEXIDINE GLUCONATE 0.12 % MT SOLN
OROMUCOSAL | Status: AC
  Administered 2022-06-13: 15 mL
  Filled 2022-06-13: qty 15

## 2022-06-13 MED ORDER — BACITRACIN ZINC 500 UNIT/GM EX OINT
TOPICAL_OINTMENT | CUTANEOUS | Status: DC | PRN
  Administered 2022-06-13: 1 via TOPICAL

## 2022-06-13 MED ORDER — ESMOLOL HCL 100 MG/10ML IV SOLN
INTRAVENOUS | Status: AC
  Filled 2022-06-13: qty 10

## 2022-06-13 MED ORDER — ACETAMINOPHEN 10 MG/ML IV SOLN: INTRAVENOUS | Status: DC | PRN

## 2022-06-13 MED ORDER — FENTANYL CITRATE (PF) 250 MCG/5ML IJ SOLN
INTRAMUSCULAR | Status: AC
  Filled 2022-06-13: qty 5

## 2022-06-13 MED ORDER — SUGAMMADEX SODIUM 200 MG/2ML IV SOLN
INTRAVENOUS | Status: DC | PRN
  Administered 2022-06-13: 200 mg via INTRAVENOUS

## 2022-06-13 MED ORDER — 0.9 % SODIUM CHLORIDE (POUR BTL) OPTIME
TOPICAL | Status: DC | PRN
  Administered 2022-06-13: 1000 mL

## 2022-06-13 MED ORDER — FENTANYL CITRATE (PF) 100 MCG/2ML IJ SOLN
INTRAMUSCULAR | Status: AC
  Filled 2022-06-13: qty 2

## 2022-06-13 MED ORDER — LIDOCAINE 2% (20 MG/ML) 5 ML SYRINGE
INTRAMUSCULAR | Status: DC | PRN
  Administered 2022-06-13: 60 mg via INTRAVENOUS

## 2022-06-13 MED ORDER — PROPOFOL 10 MG/ML IV BOLUS
INTRAVENOUS | Status: AC
Start: 2022-06-13 — End: ?
  Filled 2022-06-13: qty 20

## 2022-06-13 MED ORDER — FENTANYL CITRATE (PF) 250 MCG/5ML IJ SOLN
INTRAMUSCULAR | Status: DC | PRN
  Administered 2022-06-13 (×2): 50 ug via INTRAVENOUS
  Administered 2022-06-13: 25 ug via INTRAVENOUS
  Administered 2022-06-13: 50 ug via INTRAVENOUS
  Administered 2022-06-13: 25 ug via INTRAVENOUS
  Administered 2022-06-13 (×2): 50 ug via INTRAVENOUS
  Administered 2022-06-13 (×2): 25 ug via INTRAVENOUS

## 2022-06-13 SURGICAL SUPPLY — 56 items
BAG COUNTER SPONGE SURGICOUNT (BAG) ×4 IMPLANT
BLADE CLIPPER SURG (BLADE) ×7 IMPLANT
BLADE SURG 11 STRL SS (BLADE) ×7 IMPLANT
BOOT SUTURE AID YELLOW STND (SUTURE) IMPLANT
BUR ACORN 9.0 PRECISION (BURR) ×4 IMPLANT
CANISTER SUCT 3000ML PPV (MISCELLANEOUS) ×4 IMPLANT
CLIP RANEY DISP (INSTRUMENTS) IMPLANT
DERMABOND ADVANCED (GAUZE/BANDAGES/DRESSINGS) ×2
DERMABOND ADVANCED .7 DNX12 (GAUZE/BANDAGES/DRESSINGS) ×3 IMPLANT
DRAPE HALF SHEET 40X57 (DRAPES) ×4 IMPLANT
DRAPE INCISE IOBAN 66X45 STRL (DRAPES) ×3 IMPLANT
DRAPE ORTHO SPLIT 77X108 STRL (DRAPES) ×2
DRAPE SURG ORHT 6 SPLT 77X108 (DRAPES) ×6 IMPLANT
DURAPREP 26ML APPLICATOR (WOUND CARE) ×9 IMPLANT
ELECT REM PT RETURN 9FT ADLT (ELECTROSURGICAL) ×4
ELECTRODE REM PT RTRN 9FT ADLT (ELECTROSURGICAL) ×3 IMPLANT
GAUZE 4X4 16PLY ~~LOC~~+RFID DBL (SPONGE) IMPLANT
GLOVE ECLIPSE 7.5 STRL STRAW (GLOVE) ×8 IMPLANT
GLOVE EXAM NITRILE XL STR (GLOVE) IMPLANT
GLOVE INDICATOR 7.5 STRL GRN (GLOVE) ×4 IMPLANT
GOWN STRL REUS W/ TWL LRG LVL3 (GOWN DISPOSABLE) ×6 IMPLANT
GOWN STRL REUS W/ TWL XL LVL3 (GOWN DISPOSABLE) IMPLANT
GOWN STRL REUS W/TWL 2XL LVL3 (GOWN DISPOSABLE) IMPLANT
GOWN STRL REUS W/TWL LRG LVL3 (GOWN DISPOSABLE) ×2
GOWN STRL REUS W/TWL XL LVL3 (GOWN DISPOSABLE)
HEMOSTAT POWDER KIT SURGIFOAM (HEMOSTASIS) ×4 IMPLANT
HEMOSTAT SURGICEL 2X14 (HEMOSTASIS) IMPLANT
KIT BASIN OR (CUSTOM PROCEDURE TRAY) ×4 IMPLANT
KIT TURNOVER KIT B (KITS) ×4 IMPLANT
MARKER SKIN DUAL TIP RULER LAB (MISCELLANEOUS) IMPLANT
MARKER SPHERE PSV REFLC 13MM (MARKER) ×11 IMPLANT
NEEDLE HYPO 22GX1.5 SAFETY (NEEDLE) ×4 IMPLANT
NS IRRIG 1000ML POUR BTL (IV SOLUTION) ×4 IMPLANT
PACK LAMINECTOMY NEURO (CUSTOM PROCEDURE TRAY) ×4 IMPLANT
PAD ARMBOARD 7.5X6 YLW CONV (MISCELLANEOUS) ×12 IMPLANT
PASSER CATH 65CM DISP (NEUROSURGERY SUPPLIES) ×3 IMPLANT
SHEATH PERITONEAL INTRO 61 (SHEATH) ×4 IMPLANT
SPIKE FLUID TRANSFER (MISCELLANEOUS) ×3 IMPLANT
SPONGE T-LAP 4X18 ~~LOC~~+RFID (SPONGE) IMPLANT
STAPLER SKIN PROX WIDE 3.9 (STAPLE) ×4 IMPLANT
STRIP CLOSURE SKIN 1/2X4 (GAUZE/BANDAGES/DRESSINGS) IMPLANT
SUT ETHILON 3 0 FSL (SUTURE) IMPLANT
SUT MNCRL AB 3-0 PS2 18 (SUTURE) ×2 IMPLANT
SUT NURALON 4 0 TR CR/8 (SUTURE) IMPLANT
SUT SILK 0 TIES 10X30 (SUTURE) IMPLANT
SUT SILK 2 0 TIES 10X30 (SUTURE) ×1 IMPLANT
SUT SILK 3 0 SH 30 (SUTURE) IMPLANT
SUT VIC AB 2-0 CP2 18 (SUTURE) ×6 IMPLANT
SUT VIC AB 3-0 SH 8-18 (SUTURE) ×8 IMPLANT
TOWEL GREEN STERILE (TOWEL DISPOSABLE) ×7 IMPLANT
TOWEL GREEN STERILE FF (TOWEL DISPOSABLE) ×4 IMPLANT
TRAY FOLEY MTR SLVR 16FR STAT (SET/KITS/TRAYS/PACK) ×1 IMPLANT
TUBE CONNECTING 12X1/4 (SUCTIONS) IMPLANT
UNDERPAD 30X36 HEAVY ABSORB (UNDERPADS AND DIAPERS) ×4 IMPLANT
VALVE CERTAS PLUS SYSTEM (Valve) ×1 IMPLANT
WATER STERILE IRR 1000ML POUR (IV SOLUTION) ×4 IMPLANT

## 2022-06-13 NOTE — Plan of Care (Signed)

## 2022-06-13 NOTE — Anesthesia Preprocedure Evaluation (Addendum)
Anesthesia Evaluation  Patient identified by MRN, date of birth, ID band Patient awake    Reviewed: Allergy & Precautions, NPO status , Patient's Chart, lab work & pertinent test results  Airway Mallampati: II  TM Distance: >3 FB Neck ROM: Full    Dental  (+) Dental Advisory Given, Chipped,    Pulmonary neg pulmonary ROS, Patient abstained from smoking., former smoker,    Pulmonary exam normal breath sounds clear to auscultation       Cardiovascular negative cardio ROS Normal cardiovascular exam Rhythm:Regular Rate:Normal     Neuro/Psych  Headaches, Chiari malformation s/p decompression followed by LP shunt 04/2022 negative psych ROS   GI/Hepatic negative GI ROS, Neg liver ROS,   Endo/Other  negative endocrine ROS  Renal/GU negative Renal ROS  negative genitourinary   Musculoskeletal negative musculoskeletal ROS (+)   Abdominal   Peds  Hematology negative hematology ROS (+)   Anesthesia Other Findings 26 y.o. female with chiari malformation s/p decompression with post-op hydrocephalus s/p LP shunt placement, complex course since then with severe apneic event. Now presents for VP shunt.   Reproductive/Obstetrics                            Anesthesia Physical Anesthesia Plan  ASA: 2  Anesthesia Plan: General   Post-op Pain Management: Tylenol PO (pre-op)*   Induction: Intravenous  PONV Risk Score and Plan: 3 and Midazolam, Dexamethasone and Ondansetron  Airway Management Planned: Oral ETT  Additional Equipment: Arterial line  Intra-op Plan:   Post-operative Plan: Extubation in OR  Informed Consent: I have reviewed the patients History and Physical, chart, labs and discussed the procedure including the risks, benefits and alternatives for the proposed anesthesia with the patient or authorized representative who has indicated his/her understanding and acceptance.     Dental  advisory given  Plan Discussed with: CRNA  Anesthesia Plan Comments:         Anesthesia Quick Evaluation

## 2022-06-13 NOTE — Progress Notes (Addendum)
0603-Patient off floor to CT   0628-Patient back on unit

## 2022-06-13 NOTE — Transfer of Care (Signed)
Immediate Anesthesia Transfer of Care Note  Patient: Sabrina Diaz  Procedure(s) Performed: Right Ventriculoperitoneal Shunt Placement (Right) APPLICATION OF CRANIAL NAVIGATION Lumboperitoneal Shunt Removal  Patient Location: PACU  Anesthesia Type:General  Level of Consciousness: awake, drowsy and patient cooperative  Airway & Oxygen Therapy: Patient Spontanous Breathing  Post-op Assessment: Report given to RN and Post -op Vital signs reviewed and stable  Post vital signs: Reviewed and stable  Last Vitals:  Vitals Value Taken Time  BP 134/98 06/13/22 1606  Temp    Pulse 61 06/13/22 1608  Resp 14 06/13/22 1608  SpO2 97 % 06/13/22 1608  Vitals shown include unvalidated device data.  Last Pain:  Vitals:   06/13/22 1132  TempSrc: Oral  PainSc:       Patients Stated Pain Goal: 0 (06/12/22 0006)  Complications: No notable events documented.

## 2022-06-13 NOTE — Progress Notes (Signed)
Neurosurgery Service Progress Note  Subjective: Taking over for Dr. 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 woman with chiari malformation s/p decompression with post-op hydrocephalus s/p LP shunt placement, complex course since then with severe apneic event and readmitted.  -Discussed everything with Sabrina Diaz, I agree with Dr. Ellis Savage that the LP shunt is just not the right solution for her, will convert to a VP shunt today and explant the LP system  Yetta Barre  06/13/22 10:49 AM

## 2022-06-13 NOTE — Anesthesia Procedure Notes (Signed)
Procedure Name: Intubation Date/Time: 06/13/2022 12:50 PM  Performed by: Dorthea Cove, CRNAPre-anesthesia Checklist: Patient identified, Emergency Drugs available, Suction available and Patient being monitored Patient Re-evaluated:Patient Re-evaluated prior to induction Oxygen Delivery Method: Circle system utilized Preoxygenation: Pre-oxygenation with 100% oxygen Induction Type: IV induction Ventilation: Mask ventilation without difficulty Laryngoscope Size: Mac and 3 Grade View: Grade I Tube type: Oral Tube size: 7.0 mm Number of attempts: 1 Airway Equipment and Method: Stylet and Oral airway Placement Confirmation: ETT inserted through vocal cords under direct vision, positive ETCO2 and breath sounds checked- equal and bilateral Secured at: 22 cm Tube secured with: Tape Dental Injury: Teeth and Oropharynx as per pre-operative assessment

## 2022-06-13 NOTE — Op Note (Signed)
PATIENT: Sabrina Diaz  DAY OF SURGERY: 06/13/22   PRE-OPERATIVE DIAGNOSIS:  Hydrocephalus   POST-OPERATIVE DIAGNOSIS:  Same   PROCEDURE 1:  Placement of right frontal ventriculoperitoneal shunt with frameless stereotactic navigation  PROCEDURE 2: Removal of left lumboperitoneal shunt   SURGEON:  Surgeon(s) and Role:    Jadene Pierini, MD - Primary   ANESTHESIA: ETGA   BRIEF HISTORY: This is a 26 year old woman in whom I previously performed a chiari malformation. Post-operatively, she had suspected hydrocephalus and I placed an LP shunt with resolution of her symptoms. She then presented with an episode of likely apnea and cyanosis, workup showed evidence of increased intracranial pressure and changing her LP shunt valve settings only provided temporary improvement. Given the complexity of the findings, I therefore recommended replacing her LP shunt with a VP shunt system. This was discussed with the patient as well as risks, benefits, and alternatives and wished to proceed with surgery.   OPERATIVE DETAIL: The patient was taken to the operating room and placed on the OR table in the supine position. A formal time out was performed with two patient identifiers and confirmed the operative site. Anesthesia was induced by the anesthesia team. The head was turned contralaterally and the [place] head holder was applied with a registration array attached. The preoperative volumetric CT was then co-registered on the brainlab system with good fit. Standard landmarks were used to mark Kocher's point on the right. The operative sites were marked, hair was clipped with surgical clippers, and the area was then prepped and draped in a sterile fashion along the entire length of the shunt system.   A curvilinear incision was placed on the right scalp over Kocher's point. A burr hole was placed, dura coagulated, and frameless stereotaxy was used to guide the trajectory of a ventricular catheter, which was  inserted good flow of CSF under what appeared to be high pressure. Using a tunneler, the distal catheter was then passed subcutaneously from the scalp incision to the previously marked abdominal incision. The patient's prior abdominal incision was opened. Her LP shunt catheter was not visible. Given that it was seen on the prior CT in good position, this was presumably due to the change in positioning. I did not want to perform a large expansion of her wound and therefore created a small laparotomy in her current incision.  The shunt system was flushed then connected to the proximal catheter. After spontaneous flow was seen through the distal catheter, it was then placed intraperitoneal without resistance. With the shunt system in place and functioning, all incisions were copiously irrigated and then closed in layers. All instrument and sponge counts were correct.   Procedure two was then performed. The Mayfield head holder was removed and the patient was positioned in the lateral decubitus position. The three incisions of her LP shunt system were prepped and draped in a sterile fashion. The incision over the valve was opened and the valve was explanted. I was able to remove the proximal catheter without resistance and visualized the intact tip of the proximal catheter. The distal catheter was scarred in place and not mobile. Given that she had a new shunt catheter in her incision, I thought it best to remove as much of the visible distal catheter as possible and cut it to avoid re-opening and exposing the new VPS system. The incision was then irrigated and closed in layers. The patient was then returned to anesthesia for emergence. No apparent complications  at the completion of the procedure.  IMPLANTS: Certas shunt valve, set to performance level 3.0.  EXPLANTS: Medtronic LP shunt system with some retained distal abdominal catheter   EBL:  43mL   DRAINS: none   SPECIMENS: none   Jadene Pierini,  MD 06/13/22 4:15 PM

## 2022-06-13 NOTE — Progress Notes (Signed)
Patient went to preop. Report called to short stay unit. Family at bedside.

## 2022-06-13 NOTE — Progress Notes (Signed)
Patient NPO at [facility] for potential surgery today. Patient made aware of this.

## 2022-06-14 ENCOUNTER — Inpatient Hospital Stay (HOSPITAL_COMMUNITY): Payer: 59

## 2022-06-14 ENCOUNTER — Encounter (HOSPITAL_COMMUNITY): Payer: Self-pay | Admitting: Neurological Surgery

## 2022-06-14 DIAGNOSIS — R000 Tachycardia, unspecified: Secondary | ICD-10-CM | POA: Diagnosis not present

## 2022-06-14 LAB — RENAL FUNCTION PANEL
Albumin: 3.1 g/dL — ABNORMAL LOW (ref 3.5–5.0)
Anion gap: 10 (ref 5–15)
BUN: 17 mg/dL (ref 6–20)
CO2: 19 mmol/L — ABNORMAL LOW (ref 22–32)
Calcium: 8.1 mg/dL — ABNORMAL LOW (ref 8.9–10.3)
Chloride: 109 mmol/L (ref 98–111)
Creatinine, Ser: 0.87 mg/dL (ref 0.44–1.00)
GFR, Estimated: >60 mL/min (ref >60)
Glucose, Bld: 121 mg/dL — ABNORMAL HIGH (ref 70–99)
Phosphorus: 3.8 mg/dL (ref 2.5–4.6)
Potassium: 3.5 mmol/L (ref 3.5–5.1)
Sodium: 138 mmol/L (ref 135–145)

## 2022-06-14 LAB — CSF CULTURE W GRAM STAIN: Culture: NO GROWTH

## 2022-06-14 LAB — CBC
HCT: 39.2 % (ref 36.0–46.0)
Hemoglobin: 12.4 g/dL (ref 12.0–15.0)
MCH: 29.2 pg (ref 26.0–34.0)
MCHC: 31.6 g/dL (ref 30.0–36.0)
MCV: 92.5 fL (ref 80.0–100.0)
Platelets: 286 10*3/uL (ref 150–400)
RBC: 4.24 MIL/uL (ref 3.87–5.11)
RDW: 13.9 % (ref 11.5–15.5)
WBC: 24 10*3/uL — ABNORMAL HIGH (ref 4.0–10.5)
nRBC: 0 % (ref 0.0–0.2)

## 2022-06-14 NOTE — Progress Notes (Signed)
1530- MD paged to clarify if foley is still needed. Verbal order to remove foley per Dr. Maurice Small.   1545- Foley removed. Pt tolerated well.

## 2022-06-14 NOTE — Progress Notes (Signed)
Ophthalmology Progress Note  Subjective: Patient reports no change in [place]. Had VPS shunt placed yesterday and LP the day before. Had been repositioned.  Objective: Vital signs in last 24 hours: Temp:  [97.6 F (36.4 C)-99 F (37.2 C)] 97.8 F (36.6 C) (07/12 1552) Pulse Rate:  [72-104] 98 (07/12 1552) Resp:  [13-22] 22 (07/12 1552) BP: (117-131)/(70-88) 118/82 (07/12 1552) SpO2:  [96 %-99 %] 97 % (07/12 1552) Weight change:  Last BM Date : 06/09/22  Intake/Output from previous day: 07/11 0701 - 07/12 0700 In: 1300 [I.V.:1300] Out: 3635 [Urine:3600; Blood:35] Intake/Output this shift: Total I/O In: -  Out: 900 [Urine:900]  Base Eye Exam          Visual Acuity (ETDRS)     Right Left  [place] 20/20 bare CF           Tonometry (Tonopen)     Right Left  Pressure 19 18   Pupils     APD  Right None  Left None    Visual Fields     Right Left    Full      Extraocular Movement     Right Left    Full Full    Neuro/Psych   Oriented x3: Yes  Mood/Affect: Normal                Slit Lamp and Fundus Exam     External Exam     Right Left  External Normal Normal    Slit Lamp Exam     Right Left  Lids/Lashes Normal Normal  Conjunctiva/Sclera White and quiet White and quiet  Cornea Clear Clear  Anterior Chamber Deep and quiet Deep and quiet  Iris Grossly normal Grossly normal  Lens  Clear  Clear           Fundus Exam (deferred dilation in the right eye to preserve pupil/APD exam)     Right Clear  Posterior Vitreous   Clear  Disc   VH obscuring optic nerve  C/D Ratio     Macula   Less dense vitreous hemorrhage but more widely dispersed. Extends to the inferior periphery. Superior periphery intact and visible. No dense areas as previously noted.  Vessels   Normal course and caliber  Periphery   Attached      Recent Labs    06/12/22 0929 06/14/22 1153  WBC  --  24.0*  HGB  --  12.4  HCT  --  39.2  NA  --  138  K  --  3.5  CL  --  109   CO2  --  19*  BUN  --  17  CREATININE 0.78 0.87    Studies/Results: VAS [place] LOWER EXTREMITY VENOUS (DVT)  Result Date: 06/14/2022  Lower Venous DVT Study Patient Name:  Sabrina Diaz  Date of Exam:   06/14/2022 Medical Rec #: [id number]     Accession #:    [id number] Date of Birth: 11-14-1996     Patient Gender: F Patient Age:   26 years Exam Location:  [facility] Procedure:      VAS [place] LOWER EXTREMITY VENOUS (DVT) Referring Phys: Autumn Patty --------------------------------------------------------------------------------  Indications: Tachycardia. Other Indications: Right ventriculoperitoneal shunt placement 06/14/22. Comparison Study: No prior study Performing Technologist: Gertie Fey MHA, RDMS, RVT, RDCS  Examination Guidelines: A complete evaluation includes B-mode imaging, spectral Doppler, color Doppler, and power Doppler as needed of all accessible portions of each vessel. Bilateral testing is considered an integral  part of a complete examination. Limited examinations for reoccurring indications may be performed as noted. The reflux portion of the exam is performed with the patient in reverse Trendelenburg.  +---------+---------------+---------+-----------+----------+--------------+ RIGHT    CompressibilityPhasicitySpontaneityPropertiesThrombus Aging +---------+---------------+---------+-----------+----------+--------------+ CFV      Full           Yes      Yes                                 +---------+---------------+---------+-----------+----------+--------------+ SFJ      Full                                                        +---------+---------------+---------+-----------+----------+--------------+ FV Prox  Full                                                        +---------+---------------+---------+-----------+----------+--------------+ FV Mid   Full                                                         +---------+---------------+---------+-----------+----------+--------------+ FV DistalFull                                                        +---------+---------------+---------+-----------+----------+--------------+ PFV      Full                                                        +---------+---------------+---------+-----------+----------+--------------+ POP      Full           Yes      Yes                                 +---------+---------------+---------+-----------+----------+--------------+ PTV      Full                                                        +---------+---------------+---------+-----------+----------+--------------+ PERO     Full                                                        +---------+---------------+---------+-----------+----------+--------------+   +---------+---------------+---------+-----------+----------+--------------+ LEFT     CompressibilityPhasicitySpontaneityPropertiesThrombus Aging +---------+---------------+---------+-----------+----------+--------------+ CFV  Full           Yes      Yes                                 +---------+---------------+---------+-----------+----------+--------------+ SFJ      Full                                                        +---------+---------------+---------+-----------+----------+--------------+ FV Prox  Full                                                        +---------+---------------+---------+-----------+----------+--------------+ FV Mid   Full                                                        +---------+---------------+---------+-----------+----------+--------------+ FV DistalFull                                                        +---------+---------------+---------+-----------+----------+--------------+ PFV      Full                                                         +---------+---------------+---------+-----------+----------+--------------+ POP      Full           Yes      Yes                                 +---------+---------------+---------+-----------+----------+--------------+ PTV      Full                                                        +---------+---------------+---------+-----------+----------+--------------+ PERO     Full                                                        +---------+---------------+---------+-----------+----------+--------------+     Summary: RIGHT: - There is no evidence of deep vein thrombosis in the lower extremity.  - No cystic structure found in the popliteal fossa.  LEFT: - There is no evidence of deep vein thrombosis in the lower extremity.  - No cystic structure found in the popliteal fossa.  *See table(s) above for measurements and  observations.    Preliminary    CT HEAD WO CONTRAST ( )  Result Date: 06/14/2022 CLINICAL DATA:  Hydrocephalus post shunt placement EXAM: CT HEAD WITHOUT CONTRAST TECHNIQUE: Contiguous axial images were obtained from the base of the skull through the vertex without intravenous contrast. RADIATION DOSE REDUCTION: This exam was performed according to the departmental dose-optimization program which includes automated exposure control, adjustment of the mA and/or kV according to patient size and/or use of iterative reconstruction technique. COMPARISON:  06/13/2022 FINDINGS: Brain: Right frontal approach shunt catheter tip is in the region of the foramen of [place]. Ventricles are now slit-like in caliber. Small focus of postprocedural extra-axial air along the right frontal convexity. New subdural hygromas along the undersurface of the tentorium and left cerebellar convexity. 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 There is possible low density within the cord at the cervicomedullary junction. Remains effacement of basal cisterns. No  acute intracranial hemorrhage. Gray-white differentiation is preserved. Vascular: No hyperdense vessel. Skull: Suboccipital craniectomy. Sinuses/Orbits: No acute finding. 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 IMPRESSION: Expected postoperative changes of shunt catheter placement. Ventricles are now slit-like in caliber. Persistent effacement of basal cisterns. There are new subdural hygromas in the posterior fossa. Chiari 1 malformation post suboccipital decompression. Similar crowding at the foramen magnum. Possible low-density within the cord at the cervicomedullary junction in area of potential artifact (series 3, image 2). 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 Electronically Signed   By: Guadlupe Spanish M.D.   On: 06/14/2022 11:31   DG Abd 2 Views  Result Date: 06/14/2022 CLINICAL DATA:  Shunt placement EXAM: ABDOMEN - 2 VIEW COMPARISON:  None FINDINGS: Shunt tubing traverses LEFT upper quadrant, mid abdomen, extends into pelvis, then courses to the LEFT abdomen. Retained contrast in colon. Nonobstructive bowel gas pattern. Osseous structures unremarkable. IMPRESSION: Shunt tubing in abdomen. Electronically Signed   By: Ulyses Southward M.D.   On: 06/14/2022 10:53   DG Chest 1 View  Result Date: 06/14/2022 CLINICAL DATA:  Shunt insertion EXAM: CHEST  1 VIEW COMPARISON:  06/09/2022 FINDINGS: Shunt tubing traverses RIGHT cervical region and central chest. Normal heart size and mediastinal contours. Lungs clear. No pleural effusion or pneumothorax. IMPRESSION: Unremarkable shunt tubing. Electronically Signed   By: Ulyses Southward M.D.   On: 06/14/2022 10:49   DG Skull 1-3 Views  Result Date: 06/14/2022 CLINICAL DATA:  Shunt tube insertion EXAM: SKULL - 1-3 VIEW COMPARISON:  None FINDINGS: Intraventricular arm of shunt tubing with tip at/just across midline. Pressor for RIGHT parietal scalp. Shunt tubing traverses RIGHT head and  cervical region into RIGHT chest. No shunt discontinuity. IMPRESSION: Unremarkable VP shunt. Electronically Signed   By: Ulyses Southward M.D.   On: 06/14/2022 10:47   CT HEAD WO CONTRAST ( )  Result Date: 06/13/2022 CLINICAL DATA:  26 year old female status post suboccipital decompression. Lumbar peritoneal shunting. Recurrent CSF hypertension on recent lumbar punctures. Stereotactic study for surgical planning. EXAM: CT HEAD WITHOUT CONTRAST TECHNIQUE: Contiguous axial images were obtained from the base of the skull through the vertex without intravenous contrast. RADIATION DOSE REDUCTION: This exam was performed according to the departmental dose-optimization program which includes automated exposure control, adjustment of the mA and/or kV according to patient size and/or use of iterative reconstruction technique. COMPARISON:  Head CT 06/08/2022 and earlier. FINDINGS: Brain: Continued sagging appearance of the brainstem and cerebellar tonsils, but other basilar cisterns remain patent. Small posterior fossa CSF density  extra-axial collections underlying the tentorium and along the left posterolateral posterior fossa have not significantly changed. Lateral and 3rd ventricle size remains increased compared to 06/06/2022, but not significantly changed from 06/08/2022. Fourth ventricle remains diminutive. No midline shift. No acute intracranial hemorrhage identified. No cortically based acute infarct identified. Gray-white matter differentiation remains within normal limits. Pituitary appears negative. Vascular: No suspicious intracranial vascular hyperdensity. Skull: Stable suboccipital decompression. Sinuses/Orbits: Visualized paranasal sinuses and mastoids are clear. 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 Otherwise negative orbit and scalp soft tissues. IMPRESSION: 1.  Stable non contrast CT appearance of the brain  since 06/08/2022. Partially effaced basilar cisterns with small volume posterior fossa CSF collections. Lateral and 3rd ventricle size remains larger than on 06/06/2022, but stable and without obvious transependymal edema. 2. Suboccipital decompression with interval decreased visible pseudomeningocele. Electronically Signed   By: Odessa Fleming M.D.   On: 06/13/2022 06:37    Medications:   Assessment/Plan:  Pre-retinal hemorrhage OS/Vitreous hemorrhage OS - Obscuring vessels. Instructed patient to lay with head of the bed elevated so the hemorrhage will move out of her macula and toward the bottom and out of her vision. Patient has been laying flat since Sunday- hemorrhage probably collected over her macula causing decreased vision. If hemorrhage is nonclearing or subretinal hemorrhage is seen after hemorrhage clears will consult retinal specialist for possible submacular surgery/pneumatic displacement or vitrectomy. Will dilate both eyes tomorrow to reassess hemorrhage.   -Vision improved slightly. Hemorrhage tracking more inferior and less dense in the macula, most superior edge is at the fovea. Expect vision to improve significantly once fovea is cleared. Does not appear to have subretinal component. Encouraged to continue laying at an incline. Will return tomorrow or Monday to reassess.   -Vision worsened. Did report that she had to lay flat for LP. Hemorrhage spread more superior in macula.  -Vision not improved but hemorrhage dispersing. Patient not in upright position some of the days which delays clearing. Not scheduled for any other procedures or surgery. Will reassess tomorrow.   LOS: 9 days   Katie Moch T Georgean Spainhower 06/14/2022

## 2022-06-14 NOTE — Progress Notes (Signed)
Mobility Specialist Progress Note   06/14/22 1532  Mobility  Activity Stood at bedside (Standing exercises)  Level of Assistance Independent after set-up  Assistive Device  (HHA)  Activity Response Tolerated well  $Mobility charge 1 Mobility   Pre Mobility: 97 HR, 119/65 BP, 98% SpO2 During Mobility: 126 HR, 95% SpO2 Post Mobility: 100 HR, 121/76 BP, 97% SpO2  Received pt in bed c/o slight incisional pain and deferring ambulation today but agreeable to doing exercises standing at bed side. Requiring HHA to maintain balance during LE exercises but no faults throughout. Returned abck supine in bed w/ call bell in reach and needs met.  Holland Falling Mobility Specialist MS [facility] #:  [phone] Acute Rehab Office:  [phone]

## 2022-06-14 NOTE — Progress Notes (Signed)
Occupational Therapy Treatment Patient Details Name: Sabrina Diaz MRN: [id number] DOB: 25-Dec-1995 Today's Date: 06/14/2022   History of present illness 26 y/o female presented to ED on 06/05/22 for transfer from [facility] for unresponsive episode. 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 EEG negative. CT head concerning for mild hydrocephalus. S/p lumbar puncture 7/9. PMH: chiari malformation s/p suboccipital decompression with shunt placement (04/2022)   OT comments  Pt progressing well. Pt following all commands and sequencing through tasks. Pt passing Short Blessed test for cognition screen- requiring 1 verbal cue to stay on track for stating the months of the year in reverse order requiring assist with "August."  Pt able to recall name/address and close to time without looking at clock or watch. Cues to turn head and look at family members on L side due to L eye blindness. R eye, WFLs.  SupervisionA leaning on counter as needed for light ADL tasks and minguardA with mobility in hallway with no AD and 1 LOB episode slightly as pt going through doorway after mobilizing 100 feet. Pt tolerating 10 mins of OOB ADL/mobility before requiring rest break. Pt continues to benefit from continued OT skilled services. OT following acutely.   Recommendations for follow up therapy are one component of a multi-disciplinary discharge planning process, led by the attending physician.  Recommendations may be updated based on patient status, additional functional criteria and insurance authorization.    Follow Up Recommendations  No OT follow up    Assistance Recommended at Discharge Set up Supervision/Assistance  Patient can return home with the following  Assist for transportation   Equipment Recommendations  None recommended by OT    Recommendations for Other Services      Precautions / Restrictions Precautions Precautions: Fall Precaution  Comments: low fall Restrictions Weight Bearing Restrictions: No       Mobility Bed Mobility Overal bed mobility: Modified Independent                  Transfers Overall transfer level: Needs assistance Equipment used: None Transfers: Sit to/from Stand Sit to Stand: Min guard                 Balance Overall balance assessment: Needs assistance Sitting-balance support: No upper extremity supported, Feet supported Sitting balance-Leahy Scale: Normal       Standing balance-Leahy Scale: Fair Standing balance comment: minguardA x1 for LOB episode                           ADL either performed or assessed with clinical judgement   ADL Overall ADL's : Needs assistance/impaired     Grooming: Wash/dry hands;Wash/dry face;Supervision/safety;Standing;Oral care;Brushing hair Grooming Details (indicate cue type and reason): HR increased to 143 BPM with exertion.             Lower Body Dressing: Supervision/safety;Sitting/lateral leans;Sit to/from stand Lower Body Dressing Details (indicate cue type and reason): using figure 4 for donning socks             Functional mobility during ADLs: Supervision/safety;Min guard General ADL Comments: SupervisionA leaning on counter as needed for light ADL tasks and minguardA with  mobility in hallway with no AD and 1 LOB episode slightly as pt going through doorway after mobilizing 100 feet.    Extremity/Trunk Assessment Upper Extremity Assessment Upper Extremity Assessment: Overall WFL for tasks assessed   Lower Extremity Assessment Lower Extremity Assessment: Overall WFL for  tasks assessed        Vision   Vision Assessment?: Yes Eye Alignment: Within Functional Limits Ocular Range of Motion: Other (comment) (L eye no vision) Alignment/Gaze Preference: Within Defined Limits Tracking/Visual Pursuits: Other (comment) (L eye blindness) Additional Comments: Cues to turn head and look at family members on  L side due to L eye blindness. R eye, WFLs.   Perception     Praxis      Cognition Arousal/Alertness: Awake/alert Behavior During Therapy: WFL for tasks assessed/performed Overall Cognitive Status: Impaired/Different from baseline Area of Impairment: Memory                     Memory: Decreased short-term memory         General Comments: Pt following all commands and sequencing through tasks. Pt passing Short Blessed test for cognition screen- requiring 1 verbal cue to stay on track for stating the months of the year in reverse order requiring assist with "August."  Pt able to recall name/address and close to time without looking at clock or watch.        Exercises      Shoulder Instructions       General Comments sister and grandma in room for support    Pertinent Vitals/ Pain       Pain Assessment Pain Assessment: 0-10 Pain Score: 4  Pain Location: head Pain Descriptors / Indicators: Dull, Throbbing Pain Intervention(s): Monitored during session, Repositioned  Home Living                                          Prior Functioning/Environment              Frequency  Min 2X/week        Progress Toward Goals  OT Goals(current goals can now be found in the care plan section)  Progress towards OT goals: Progressing toward goals  Acute Rehab OT Goals Patient Stated Goal: to go home OT Goal Formulation: With patient Time For Goal Achievement: 06/24/22 Potential to Achieve Goals: Good  Plan Discharge plan remains appropriate    Co-evaluation                 AM-PAC OT "6 Clicks" Daily Activity     Outcome Measure   Help from another person eating meals?: None Help from another person taking care of personal grooming?: None Help from another person toileting, which includes using toliet, bedpan, or urinal?: A Little Help from another person bathing (including washing, rinsing, drying)?: A Little Help from another  person to put on and taking off regular upper body clothing?: None Help from another person to put on and taking off regular lower body clothing?: None 6 Click Score: 22    End of Session Equipment Utilized During Treatment: Gait belt  OT Visit Diagnosis: Unsteadiness on feet (R26.81);Other abnormalities of gait and mobility (R26.89);Pain Pain - part of body:  (headache)   Activity Tolerance Patient tolerated treatment well   Patient Left in chair;with call bell/phone within reach;with family/visitor present   Nurse Communication Mobility status        Time: 1300-1326 OT Time Calculation (min): 26 min  Charges: OT General Charges $OT Visit: 1 Visit OT Treatments $Self Care/Home Management : 8-22 mins $Cognitive Funtion inital: Initial 15 mins Flora Lipps, OTR/L Acute Rehabilitation Services  Office: [phone]   Lonzo Cloud  06/14/2022, 2:52 PM

## 2022-06-14 NOTE — Progress Notes (Signed)
Bilateral lower extremity venous duplex completed. Refer to "CV Proc" under chart review to view preliminary results.  06/14/2022 12:58 PM Eula Fried., MHA, RVT, RDCS, RDMS

## 2022-06-14 NOTE — Anesthesia Postprocedure Evaluation (Signed)
Anesthesia Post Note  Patient: Chriselda P Berryhill  Procedure(s) Performed: Right Ventriculoperitoneal Shunt Placement (Right) APPLICATION OF CRANIAL NAVIGATION Lumboperitoneal Shunt Removal     Patient location during evaluation: PACU Anesthesia Type: General Level of consciousness: awake and alert Pain management: pain level controlled Vital Signs Assessment: post-procedure vital signs reviewed and stable Respiratory status: spontaneous breathing, nonlabored ventilation, respiratory function stable and patient connected to nasal cannula oxygen Cardiovascular status: blood pressure returned to baseline and stable Postop Assessment: no apparent nausea or vomiting Anesthetic complications: no   No notable events documented.  Last Vitals:  Vitals:   06/14/22 0312 06/14/22 0710  BP: 118/75 117/75  Pulse: 72 81  Resp: 20 19  Temp: 37.2 C 36.9 C  SpO2: 96% 99%    Last Pain:  Vitals:   06/14/22 0726  TempSrc:   PainSc: 4                  Treonna Klee L Vaida Kerchner

## 2022-06-14 NOTE — Progress Notes (Signed)
Neurosurgery Service Progress Note  Subjective: No acute events overnight, she reports feeling the best today that she has in months, she's very happy   Objective: Vitals:   06/13/22 1925 06/13/22 2251 06/14/22 0312 06/14/22 0710  BP: 119/80 128/71 118/75 117/75  Pulse: 95 100 72 81  Resp: 18 (!) 21 20 19   Temp: 97.6 F (36.4 C) 97.7 F (36.5 C) 99 F (37.2 C) 98.4 F (36.9 C)  TempSrc: Axillary Oral Oral Oral  SpO2: 99% 97% 96% 99%  Weight:      Height:        Physical Exam: Aox3, PERRL, EOMI, FS & SS, strength 5/5 x4 and SILTx4, incisions c/d/i  Assessment & Plan: 26 y.o. woman with chiari, prior LPS, now s/p VPS placement and LPS removal, recovering well.  -CTH and shunt series today -some persistent tachycardia despite having minimal pain this morning, will repeat labs and get a DVT 22 given that she's been less active than usual -[facility] tomorrow  [facility]  06/14/22 9:55 AM

## 2022-06-15 MED ORDER — HEPARIN SODIUM (PORCINE) 5000 UNIT/ML IJ SOLN
5000.0000 [IU] | Freq: Three times a day (TID) | INTRAMUSCULAR | Status: DC
  Administered 2022-06-15 – 2022-06-16 (×3): 5000 [IU] via SUBCUTANEOUS
  Filled 2022-06-15 (×3): qty 1

## 2022-06-15 NOTE — Progress Notes (Signed)
Ophthalmology Progress Note  Subjective: Patient reports no change in [place].  Objective: Vital signs in last 24 hours: Temp:  [97.7 F (36.5 C)-98.7 F (37.1 C)] 97.9 F (36.6 C) (07/13 1515) Pulse Rate:  [92-111] 103 (07/13 1515) Resp:  [15-23] 15 (07/13 1515) BP: (115-126)/(65-82) 126/78 (07/13 1515) SpO2:  [96 %-97 %] 96 % (07/13 1515) Weight change:  Last BM Date : 06/14/22  Intake/Output from previous day: 07/12 0701 - 07/13 0700 In: -  Out: 900 [Urine:900] Intake/Output this shift: No intake/output data recorded.  Base Eye Exam          Visual Acuity (ETDRS)     Right Left  Near [place]  HM           Tonometry (Tonopen)     Right Left  Pressure  16   Pupils     APD  Right None  Left None    Visual Fields     Right Left    Full      Extraocular Movement     Right Left    Full Full    Neuro/Psych   Oriented x3: Yes  Mood/Affect: Normal                Slit Lamp and Fundus Exam     External Exam     Right Left  External Normal Normal    Slit Lamp Exam     Right Left  Lids/Lashes Normal Normal  Conjunctiva/Sclera White and quiet White and quiet  Cornea Clear Clear  Anterior Chamber Deep and quiet Deep and quiet  Iris Grossly normal Grossly normal  Lens  Clear  Clear           Fundus Exam (deferred dilation in the right eye to preserve pupil/APD exam)     Right Clear  Posterior Vitreous   Clear  Disc   VH clearing, able to see optic nerve but no details  C/D Ratio      Macula   Less dense vitreous hemorrhage able to see retina in all but the macula. Subhyaloid hemorrhage with layering  Vessels   Normal course and caliber  Periphery   Attached      Recent Labs    06/14/22 1153  WBC 24.0*  HGB 12.4  HCT 39.2  NA 138  K 3.5  CL 109  CO2 19*  BUN 17  CREATININE 0.87    Studies/Results: VAS [place] LOWER EXTREMITY VENOUS (DVT)  Result Date: 06/14/2022  Lower Venous DVT Study Patient Name:  MAELEIGH BUSCHMAN  Date of Exam:    06/14/2022 Medical Rec #: [id number]     Accession #:    [id number] Date of Birth: 1996-03-02     Patient Gender: F Patient Age:   26 years Exam Location:  [facility] Procedure:      VAS [place] LOWER EXTREMITY VENOUS (DVT) Referring Phys: Autumn Patty --------------------------------------------------------------------------------  Indications: Tachycardia. Other Indications: Right ventriculoperitoneal shunt placement 06/14/22. Comparison Study: No prior study Performing Technologist: Gertie Fey MHA, RDMS, RVT, RDCS  Examination Guidelines: A complete evaluation includes B-mode imaging, spectral Doppler, color Doppler, and power Doppler as needed of all accessible portions of each vessel. Bilateral testing is considered an integral part of a complete examination. Limited examinations for reoccurring indications may be performed as noted. The reflux portion of the exam is performed with the patient in reverse Trendelenburg.  +---------+---------------+---------+-----------+----------+--------------+ RIGHT    CompressibilityPhasicitySpontaneityPropertiesThrombus Aging +---------+---------------+---------+-----------+----------+--------------+ CFV      Full  Yes      Yes                                 +---------+---------------+---------+-----------+----------+--------------+ SFJ      Full                                                        +---------+---------------+---------+-----------+----------+--------------+ FV Prox  Full                                                        +---------+---------------+---------+-----------+----------+--------------+ FV Mid   Full                                                        +---------+---------------+---------+-----------+----------+--------------+ FV DistalFull                                                        +---------+---------------+---------+-----------+----------+--------------+ PFV       Full                                                        +---------+---------------+---------+-----------+----------+--------------+ POP      Full           Yes      Yes                                 +---------+---------------+---------+-----------+----------+--------------+ PTV      Full                                                        +---------+---------------+---------+-----------+----------+--------------+ PERO     Full                                                        +---------+---------------+---------+-----------+----------+--------------+   +---------+---------------+---------+-----------+----------+--------------+ LEFT     CompressibilityPhasicitySpontaneityPropertiesThrombus Aging +---------+---------------+---------+-----------+----------+--------------+ CFV      Full           Yes      Yes                                 +---------+---------------+---------+-----------+----------+--------------+ SFJ  Full                                                        +---------+---------------+---------+-----------+----------+--------------+ FV Prox  Full                                                        +---------+---------------+---------+-----------+----------+--------------+ FV Mid   Full                                                        +---------+---------------+---------+-----------+----------+--------------+ FV DistalFull                                                        +---------+---------------+---------+-----------+----------+--------------+ PFV      Full                                                        +---------+---------------+---------+-----------+----------+--------------+ POP      Full           Yes      Yes                                 +---------+---------------+---------+-----------+----------+--------------+ PTV      Full                                                         +---------+---------------+---------+-----------+----------+--------------+ PERO     Full                                                        +---------+---------------+---------+-----------+----------+--------------+     Summary: RIGHT: - There is no evidence of deep vein thrombosis in the lower extremity.  - No cystic structure found in the popliteal fossa.  LEFT: - There is no evidence of deep vein thrombosis in the lower extremity.  - No cystic structure found in the popliteal fossa.  *See table(s) above for measurements and observations. Electronically signed by Coral Else MD on 06/14/2022 at 9:56:21 PM.    Final    CT HEAD WO CONTRAST ( )  Result Date: 06/14/2022 CLINICAL DATA:  Hydrocephalus post shunt placement EXAM: CT HEAD WITHOUT CONTRAST TECHNIQUE: Contiguous axial images were obtained from the base of the skull through the vertex without intravenous  contrast. RADIATION DOSE REDUCTION: This exam was performed according to the departmental dose-optimization program which includes automated exposure control, adjustment of the mA and/or kV according to patient size and/or use of iterative reconstruction technique. COMPARISON:  06/13/2022 FINDINGS: Brain: Right frontal approach shunt catheter tip is in the region of the foramen of [place]ro. Ventricles are now slit-like in caliber. Small focus of postprocedural extra-axial air along the right frontal convexity. New subdural hygromas along the undersurface of the tentorium and left cerebellar convexity. 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 There is possible low density within the cord at the cervicomedullary junction. Remains effacement of basal cisterns. No acute intracranial hemorrhage. Gray-white differentiation is preserved. Vascular: No hyperdense vessel. Skull: Suboccipital craniectomy. Sinuses/Orbits: No acute finding. 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 IMPRESSION: Expected postoperative changes of shunt catheter placement. Ventricles are now slit-like in caliber. Persistent effacement of basal cisterns. There are new subdural hygromas in the posterior fossa. Chiari 1 malformation post suboccipital decompression. Similar crowding at the foramen magnum. Possible low-density within the cord at the cervicomedullary junction in area of potential artifact (series 3, image 2). 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 Electronically Signed   By: Guadlupe SpanishPraneil  Patel M.D.   On: 06/14/2022 11:31   DG Abd 2 Views  Result Date: 06/14/2022 CLINICAL DATA:  Shunt placement EXAM: ABDOMEN - 2 VIEW COMPARISON:  None FINDINGS: Shunt tubing traverses LEFT upper quadrant, mid abdomen, extends into pelvis, then courses to the LEFT abdomen. Retained contrast in colon. Nonobstructive bowel gas pattern. Osseous structures unremarkable. IMPRESSION: Shunt tubing in abdomen. Electronically Signed   By: Ulyses SouthwardMark  Boles M.D.   On: 06/14/2022 10:53   DG Chest 1 View  Result Date: 06/14/2022 CLINICAL DATA:  Shunt insertion EXAM: CHEST  1 VIEW COMPARISON:  06/09/2022 FINDINGS: Shunt tubing traverses RIGHT cervical region and central chest. Normal heart size and mediastinal contours. Lungs clear. No pleural effusion or pneumothorax. IMPRESSION: Unremarkable shunt tubing. Electronically Signed   By: Ulyses SouthwardMark  Boles M.D.   On: 06/14/2022 10:49   DG Skull 1-3 Views  Result Date: 06/14/2022 CLINICAL DATA:  Shunt tube insertion EXAM: SKULL - 1-3 VIEW COMPARISON:  None FINDINGS: Intraventricular arm of shunt tubing with tip at/just across midline. Pressor for RIGHT parietal scalp. Shunt tubing traverses RIGHT head and cervical region into RIGHT chest. No shunt discontinuity. IMPRESSION: Unremarkable VP shunt. Electronically Signed   By: Ulyses SouthwardMark  Boles M.D.   On: 06/14/2022 10:47    Medications:   Assessment/Plan:  Pre-retinal hemorrhage  OS/Vitreous hemorrhage OS - Obscuring vessels. Instructed patient to lay with head of the bed elevated so the hemorrhage will move out of her macula and toward the bottom and out of her vision. Patient has been laying flat since Sunday- hemorrhage probably collected over her macula causing decreased vision. If hemorrhage is nonclearing or subretinal hemorrhage is seen after hemorrhage clears will consult retinal specialist for possible submacular surgery/pneumatic displacement or vitrectomy. Will dilate both eyes tomorrow to reassess hemorrhage.   -Vision improved slightly. Hemorrhage tracking more inferior and less dense in the macula, most superior edge is at the fovea. Expect vision to improve significantly once fovea is cleared. Does not appear to have subretinal component. Encouraged to continue laying at an incline. Will return tomorrow or Monday to reassess.   -Vision worsened. Did report that she had to lay flat for LP. Hemorrhage spread more superior in macula.   -Vision not  improved but hemorrhage dispersing. Patient not in upright position some of the days which delays clearing. Not scheduled for any other procedures or surgery. Will reassess tomorrow.  -Vision not improving, VH clearing, subhyaloid hemorrhage still present.  If patient is discharged please have patient follow up outpatient on Monday 06/19/22.  Follow up with Dr. Sharyn Dross at [facility] Address: [address] [place], [place] [address] Phone: [phone]. Fax: [phone]    LOS: 10 days   Novelle Addair T Amillion [place] 06/15/2022

## 2022-06-15 NOTE — Progress Notes (Signed)
Physical Therapy Treatment Patient Details Name: Sabrina Diaz MRN: [id number] DOB: 10-21-1996 Today's Date: 06/15/2022   History of Present Illness 26 y/o female presented to ED on 06/05/22 for transfer from [facility] for unresponsive episode. 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 EEG negative. CT head concerning for mild hydrocephalus. S/p lumbar puncture 7/9. PMH: chiari malformation s/p suboccipital decompression with shunt placement (04/2022)    PT Comments    The pt was agreeable to session with focus on progressing OOB mobility and final education in preparation for anticipated d/c home. The pt was able to ambulate ~300 ft without need for assist or UE support, and tolerated balance challenges well without need for assist. Educated on continued mobility and progression after return home and pt verbalized understanding. Will continue to follow acutely, pt is safe to return home with family support when medically stable.  Dynamic Gait Index (DGI): 22/24 (<19 indicates increased risk for falls)    Recommendations for follow up therapy are one component of a multi-disciplinary discharge planning process, led by the attending physician.  Recommendations may be updated based on patient status, additional functional criteria and insurance authorization.  Follow Up Recommendations  No PT follow up     Assistance Recommended at Discharge Intermittent Supervision/Assistance  Patient can return home with the following Assistance with cooking/housework;A little help with bathing/dressing/bathroom;Help with stairs or ramp for entrance   Equipment Recommendations  None recommended by PT    Recommendations for Other Services       Precautions / Restrictions Precautions Precautions: Fall Precaution Comments: low fall Restrictions Weight Bearing Restrictions: No     Mobility  Bed Mobility Overal bed mobility: Modified  Independent                  Transfers Overall transfer level: Needs assistance Equipment used: None Transfers: Sit to/from Stand Sit to Stand: Min guard           General transfer comment: minG for safety, no overt LOB    Ambulation/Gait Ambulation/Gait assistance: Min guard Gait Distance (Feet): 300 Feet Assistive device: None Gait Pattern/deviations: Step-through pattern, Decreased stride length, Staggering right Gait velocity: 0.56 m/s Gait velocity interpretation: 1.31 - 2.62 ft/sec, indicative of limited community ambulator   General Gait Details: no overt LOB with regular walking, minA with tandem walking to steady   Stairs Stairs: Yes Stairs assistance: Supervision Stair Management: No rails, Alternating pattern, Forwards Number of Stairs: 2 General stair comments: no UE support x2 stairs      Balance Overall balance assessment: Needs assistance Sitting-balance support: No upper extremity supported, Feet supported Sitting balance-Leahy Scale: Normal       Standing balance-Leahy Scale: Fair Standing balance comment: minguardA x1 for LOB episode                 Standardized Balance Assessment Standardized Balance Assessment : Dynamic Gait Index   Dynamic Gait Index Level Surface: Normal Change in Gait Speed: Normal Gait with Horizontal Head Turns: Normal Gait with Vertical Head Turns: Mild Impairment Gait and Pivot Turn: Normal Step Over Obstacle: Mild Impairment Step Around Obstacles: Normal Steps: Normal Total Score: 22      Cognition Arousal/Alertness: Awake/alert Behavior During Therapy: WFL for tasks assessed/performed Overall Cognitive Status: Impaired/Different from baseline Area of Impairment: Memory                     Memory: Decreased short-term memory  General Comments: pt following all cues and commands well, slight delay at times to complete, flat affect        Exercises      General  Comments General comments (skin integrity, edema, etc.): VSS on RA      Pertinent Vitals/Pain Pain Assessment Pain Assessment: Faces Faces Pain Scale: Hurts a little bit Pain Location: head Pain Descriptors / Indicators: Dull, Throbbing Pain Intervention(s): Limited activity within patient's tolerance, Monitored during session     PT Goals (current goals can now be found in the care plan section) Acute Rehab PT Goals Patient Stated Goal: to get better PT Goal Formulation: With patient Time For Goal Achievement: 06/22/22 Potential to Achieve Goals: Good Progress towards PT goals: Progressing toward goals    Frequency    Min 3X/week      PT Plan Current plan remains appropriate       AM-PAC PT "6 Clicks" Mobility   Outcome Measure  Help needed turning from your back to your side while in a flat bed without using bedrails?: None Help needed moving from lying on your back to sitting on the side of a flat bed without using bedrails?: None Help needed moving to and from a bed to a chair (including a wheelchair)?: None Help needed standing up from a chair using your arms (e.g., wheelchair or bedside chair)?: None Help needed to walk in hospital room?: A Little Help needed climbing 3-5 steps with a railing? : A Little 6 Click Score: 22    End of Session Equipment Utilized During Treatment: Gait belt Activity Tolerance: Patient tolerated treatment well Patient left: with call bell/phone within reach;in chair;with family/visitor present Nurse Communication: Mobility status PT Visit Diagnosis: Unsteadiness on feet (R26.81)     Time: 2706-2376 PT Time Calculation (min) (ACUTE ONLY): 18 min  Charges:  $Gait Training: 8-22 mins                     Vickki Muff, PT, DPT   Acute Rehabilitation Department   Ronnie Derby 06/15/2022, 11:58 AM

## 2022-06-15 NOTE — Progress Notes (Signed)
Neurosurgery Service Progress Note  Subjective: No acute events overnight, still doing well, had a large BM this morning  Objective: Vitals:   06/14/22 2335 06/15/22 0315 06/15/22 0719 06/15/22 0937  BP: 125/65 116/76 117/74   Pulse: 95 100 92 97  Resp: 20 15 16 18   Temp: 97.7 F (36.5 C) 98.7 F (37.1 C) 98.2 F (36.8 C)   TempSrc: Oral Oral Oral   SpO2: 96% 96% 96% 97%  Weight:      Height:        Physical Exam: Aox3, PERRL, EOMI, FS & SS, strength 5/5 x4 and SILTx4, incisions c/d/i  Assessment & Plan: 26 y.o. woman with chiari, prior LPS, now s/p VPS placement with Certas at 3.0 and LPS removal, recovering well.  -CTH / SS show good position, confirmed valve setting, clinically feels good at this level so no change -labs and DVT 22 reassuring -likely discharge this afternoon, will start [facility] in case she stays until tomorrow  [facility]  06/15/22 10:25 AM

## 2022-06-15 NOTE — TOC Progression Note (Signed)
Transition of Care [facility]) - Progression Note    Patient Details  Name: Sabrina Diaz MRN: [id number] Date of Birth: 22-Jun-1996  [facility] CM/SW Contact  Beckie Busing, RN Phone [phone]  06/15/2022, 3:33 PM  Clinical Narrative:    TOC continues to follow. Currently no d/c needs identified.         Expected Discharge Plan and Services                                                 Social Determinants of Health (SDOH) Interventions    Readmission Risk Interventions     No data to display

## 2022-06-15 NOTE — Progress Notes (Signed)
Pt IV appears to be leaking and infiltrated. PIV removed. Pt request to not have another IV placed as she is discharging this afternoon or in the morning.

## 2022-06-16 MED ORDER — OXYCODONE-ACETAMINOPHEN 5-325 MG PO TABS: 1.0000 | ORAL_TABLET | ORAL | 0 refills | Status: DC | PRN

## 2022-06-16 MED ORDER — ONDANSETRON 4 MG PO TBDP: 4.0000 mg | ORAL_TABLET | Freq: Two times a day (BID) | ORAL | 0 refills | Status: DC | PRN

## 2022-06-16 NOTE — Discharge Summary (Signed)
Discharge Summary  Date of Admission: 06/05/2022  Date of Discharge: 06/16/22  Attending Physician: Autumn Patty, MD  Hospital Course: Patient was admitted after being found by her husband unresponsive and cyanotic in her sleep, she regained consciousness, went to an outside ED, then transferred to [facility]. LP showed no evidence of shunt infection and changes in her LP shunt settings helped some but ultimately only temporarily. She also had some decrease in vision, ophtho was consulted and diagnosed a vitreous hemorrhage with some moderate contralateral papilledema. She was therefore taken to the OR on 06/13/22 for removal of her LP shunt and placement of a right VP shunt. Post-op CTH and shunt series showed good positioning. She immediately felt better post-op. She had some self-limited tachycardia that resolved on its own, a DVT U/S of BLE was neg. She felt much better and was discharged home on 06/16/21. They will follow up in clinic with me in clinic in 2 weeks.  Neurologic exam at discharge:  Aox3, PERRL, EOMI, FS & SS, strength 5/5x4  Discharge diagnosis: Hydrocephalus  Jadene Pierini, MD 06/16/22 8:18 AM

## 2022-06-16 NOTE — Progress Notes (Addendum)
Pt with orders to d/c home. Assessment and VS documented. Pt is stable. Discharge packet and education provided at bedside all questions answered. Prescriptions sent to pharmacy pt is aware. Pt and all belongings transported via wheelchair to private vehicle.

## 2022-06-16 NOTE — Progress Notes (Signed)
Neurosurgery Service Progress Note  Subjective: No acute events overnight, feels great, wants to go home this morning  Objective: Vitals:   06/15/22 1930 06/15/22 2300 06/16/22 0334 06/16/22 0745  BP: 120/79 122/71 (!) 117/57 125/71  Pulse: 89 (!) 103 (!) 103 78  Resp: 17 16 17 17   Temp: 97.6 F (36.4 C) 97.7 F (36.5 C) 99.1 F (37.3 C) 97.7 F (36.5 C)  TempSrc: Oral Oral Oral Oral  SpO2: 96% 96% 94% 93%  Weight:      Height:        Physical Exam: Aox3, PERRL, EOMI, FS & SS, strength 5/5 x4 and SILTx4, incisions c/d/i  Assessment & Plan: 26 y.o. woman with chiari, prior LPS, now s/p VPS placement with Certas at 3.0 and LPS removal, recovering well.  -discharge home today  22  06/16/22 8:18 AM

## 2022-06-21 NOTE — Progress Notes (Signed)
[facility] - Clinic Note  06/23/2022     CHIEF COMPLAINT Patient presents for Retina Evaluation   HISTORY OF PRESENT ILLNESS: Sabrina Diaz is a 26 y.o. female who presents to the clinic today for:   HPI     Retina Evaluation   In left eye.  This started 2 weeks ago.  Associated Symptoms Flashes and Distortion.  Context:  distance vision and near vision.  I, the attending physician,  performed the HPI with the patient and updated documentation appropriately.        Comments   Patient is referred here by Dr. Zenaida Niece for a possible vit heme. On 06/05/22 as per patient was told by husband that she sat up in the bed, grabbed the back of her neck, screamed and then blacked out and went blue. Since that episode she has seen a big red blob and now its like looking through water droplets.       Last edited by Rennis Chris, MD on 06/26/2022 12:03 AM.     Patient complains of persistent headaches. First LP shunt surgery was in May 2023, shunt failed and a VP shunt was put in on 07.11.23.   Referring physician: Diona Foley, MD [address] [place],  [place] [address]  HISTORICAL INFORMATION:   Selected notes from the MEDICAL RECORD NUMBER Referred by Dr. Zenaida Niece LEE:  Ocular Hx- Vitreous Hemorrhage OS PMH-    CURRENT MEDICATIONS: No current outpatient medications on file. (Ophthalmic Drugs)   No current facility-administered medications for this visit. (Ophthalmic Drugs)   Current Outpatient Medications (Other)  Medication Sig   Calcium Carbonate Antacid (TUMS PO) Take 2 tablets by mouth 2 (two) times daily as needed (acid reflux/heartburn).   cyclobenzaprine (FLEXERIL) 10 MG tablet Take 10 mg by mouth 3 (three) times daily as needed for muscle spasms.   ibuprofen (ADVIL) 200 MG tablet Take 800 mg by mouth every 4 (four) hours as needed for headache or moderate pain.   ondansetron (ZOFRAN-ODT) 4 MG disintegrating tablet Take 1 tablet (4 mg total) by mouth  every 12 (twelve) hours as needed for nausea.   oxyCODONE-acetaminophen (PERCOCET) 5-325 MG tablet Take 1 tablet by mouth every 4 (four) hours as needed (pain).   traZODone (DESYREL) 100 MG tablet Take 1 tablet (100 mg total) by mouth at bedtime. (Patient not taking: Reported on 06/06/2022)   zonisamide (ZONEGRAN) 100 MG capsule Take 2 capsules (200 mg total) by mouth daily. Take 100 mg (1 pill) daily for two weeks, then increase to 200 mg (2 pills) daily. (Patient taking differently: Take 100 mg by mouth 2 (two) times daily. For headaches)   No current facility-administered medications for this visit. (Other)    REVIEW OF SYSTEMS: ROS   Positive for: Neurological, Eyes Negative for: Constitutional, Gastrointestinal, Skin, Genitourinary, Musculoskeletal, HENT, Endocrine, Cardiovascular, Respiratory, Psychiatric, Allergic/Imm, Heme/Lymph Last edited by Joni Reining, COA on 06/23/2022  9:50 AM.     ALLERGIES No Known Allergies  PAST MEDICAL HISTORY Past Medical History:  Diagnosis Date   Anxiety    Chiari malformation type I (HCC)    Chronic headaches    Past Surgical History:  Procedure Laterality Date   APPLICATION OF CRANIAL NAVIGATION  06/13/2022   Procedure: APPLICATION OF CRANIAL NAVIGATION;  Surgeon: Jadene Pierini, MD;  Location: [facility] OR;  Service: Neurosurgery;;   SHUNT REMOVAL N/A 06/13/2022   Procedure: Lumboperitoneal Shunt Removal;  Surgeon: Jadene Pierini, MD;  Location: [facility] OR;  Service: Neurosurgery;  Laterality: N/A;   SUBOCCIPITAL CRANIECTOMY CERVICAL LAMINECTOMY N/A 04/03/2022   Procedure: Chiari decompression;  Surgeon: Jadene Pierini, MD;  [facility];  Service: Neurosurgery;  Laterality: N/A;  RM 21   VENTRICULOPERITONEAL SHUNT N/A 04/29/2022   Procedure: LUMBOPERITONEAL SHUNT PLACEMENT;  Surgeon: Jadene Pierini, MD;  Location: [facility] OR;  Service: Neurosurgery;  Laterality: N/A;   VENTRICULOPERITONEAL SHUNT Right 06/13/2022   Procedure: Right  Ventriculoperitoneal Shunt Placement;  Surgeon: Jadene Pierini, MD;  Location: [facility] OR;  Service: Neurosurgery;  Laterality: Right;    FAMILY HISTORY History reviewed. No pertinent family history.  SOCIAL HISTORY Social History   Tobacco Use   Smoking status: Former    Types: Cigarettes    Quit date: 07/2021    Years since quitting: 0.9   Smokeless tobacco: Never  Vaping Use   Vaping Use: Every day  Substance Use Topics   Alcohol use: Yes    Comment: socially   Drug use: Not Currently    Types: Marijuana    Comment: Last use 12/2021         OPHTHALMIC EXAM:  Base Eye Exam     Visual Acuity (Snellen - Linear)       Right Left   Dist [place] 20/70 CF at face   Dist ph [place] 20/30 NI         Tonometry (Tonopen, 9:07 AM)       Right Left   Pressure 18 12         Pupils       Dark Light Shape React APD   Right 5 4 Round Brisk None   Left 5 6 Round Brisk +1         Visual Fields       Left Right     Full   Restrictions Partial outer superior temporal, inferior temporal deficiencies          Extraocular Movement       Right Left    Full, Ortho Full, Ortho         Neuro/Psych     Oriented x3: Yes         Dilation     Both eyes: 1.0% Mydriacyl, 2.5% Phenylephrine @ 9:00 AM           Slit Lamp and Fundus Exam     External Exam       Right Left   External Normal Normal         Slit Lamp Exam       Right Left   Lids/Lashes Mild MGB Mild MGB   Conjunctiva/Sclera White and quiet White and quiet   Cornea Debris in tear film Debris in tear film   Anterior Chamber Deep and quiet Deep and quiet   Iris Round and reactive. Positive PPM Round and reactive. Positive PPM   Lens Clear Clear   Anterior Vitreous Normal Postive RBC's, old blot clots setteled inferiorly turning white         Fundus Exam       Right Left   Disc Pink and sharp, Compact Pink and sharp, vascular loops   C/D Ratio 0.1 0.2   Macula Flat, Good foveal  reflex, No heme, No edema Grossly flat part obscured by large subhyaloid heme   Vessels Normal Vascular attenuation   Periphery Normal Attached, obscured by VH/blots clots           Refraction     Manifest Refraction  Sphere Cylinder Dist VA   Right -1.25 Sphere 20/20   Left               IMAGING AND PROCEDURES  Imaging and Procedures for 06/23/2022  OCT, Retina - OU - Both Eyes       Right Eye Quality was good. Central Foveal Thickness: 269. Progression has no prior data. Findings include normal foveal contour, no IRF, no SRF, vitreomacular adhesion .   Left Eye Quality was good. Central Foveal Thickness: 947. Progression has no prior data. Findings include abnormal foveal contour (Inferior macula obscured by large subhyaloid heme, superior macula appears normal).   Notes *Images captured and stored on drive  Diagnosis / Impression:  OD: NFP; no IRF/SRF  OS: Inferior macula obscured by large subhyaloid heme, superior macula appears normal  Clinical management:  See below  Abbreviations: NFP - Normal foveal profile. CME - cystoid macular edema. PED - pigment epithelial detachment. IRF - intraretinal fluid. SRF - subretinal fluid. EZ - ellipsoid zone. ERM - epiretinal membrane. ORA - outer retinal atrophy. ORT - outer retinal tubulation. SRHM - subretinal hyper-reflective material. IRHM - intraretinal hyper-reflective material            ASSESSMENT/PLAN:    ICD-10-CM   1. Vitreous hemorrhage of left eye (HCC)  H43.12 OCT, Retina - OU - Both Eyes    2. Subhyaloid hemorrhage of left eye  H35.62     3. Terson syndrome of left eye (HCC)  H43.12 OCT, Retina - OU - Both Eyes   I60.8       1-3. Vitreous and subhyaloid hemorrhage / Terson's syndrome OS  - large central subhyaloid heme -- onset around 7/3  - history Chiari malformation -- had VP shunt placed 5.27.23  - had syncopal episode on 7.3.23 and came to w/ decreased central vision OS  - was  followed by Dr. Zenaida Niece during hospitalization - VA CF @ face  - IOP OS 12 - discussed findings, prognosis, and treatment options - VH precautions reviewed -- minimize activities, keep head elevated, avoid ASA/NSAIDs/blood thinners as able  - Patient was advised to observe and monitor at this time. If blood does not resolve will consider surgery.  - Patient was instructed to sleep with her head elevated.  - Monitor closely return in 1-2 weeks, DFE, OCT.   Ophthalmic Meds Ordered this visit:  No orders of the defined types were placed in this encounter.     Return in about 1 week (around 06/30/2022) for 1-2 wks - f/u Terson's synd -- DFE, OCT.  There are no Patient Instructions on file for this visit.   Explained the diagnoses, plan, and follow up with the patient and they expressed understanding.  Patient expressed understanding of the importance of proper follow up care.   This document serves as a record of services personally performed by Karie Chimera, MD, PhD. It was created on their behalf by Joni Reining, an ophthalmic technician. The creation of this record is the provider's dictation and/or activities during the visit.    Electronically signed by: Joni Reining COA, 06/26/22  12:26 AM  This document serves as a record of services personally performed by Karie Chimera, MD, PhD. It was created on their behalf by Gerilyn Nestle, COT an ophthalmic technician. The creation of this record is the provider's dictation and/or activities during the visit.    Electronically signed by:  Gerilyn Nestle, COT  06/23/22  12:26 AM  Karie Chimera, M.D.,  Ph.D. Diseases & Surgery of the Retina and Vitreous [facility]  I have reviewed the above documentation for accuracy and completeness, and I agree with the above. Karie Chimera, M.D., Ph.D. 06/26/22 12:27 AM   Abbreviations: M myopia (nearsighted); A astigmatism; H hyperopia (farsighted); P presbyopia;  Mrx spectacle prescription;  CTL contact lenses; OD right eye; OS left eye; OU both eyes  XT exotropia; ET esotropia; PEK punctate epithelial keratitis; PEE punctate epithelial erosions; DES dry eye syndrome; MGD meibomian gland dysfunction; ATs artificial tears; PFAT's preservative free artificial tears; NSC nuclear sclerotic cataract; PSC posterior subcapsular cataract; ERM epi-retinal membrane; PVD posterior vitreous detachment; RD retinal detachment; DM diabetes mellitus; DR diabetic retinopathy; NPDR non-proliferative diabetic retinopathy; PDR proliferative diabetic retinopathy; CSME clinically significant macular edema; DME diabetic macular edema; dbh dot blot hemorrhages; CWS cotton wool spot; POAG primary open angle glaucoma; C/D cup-to-disc ratio; HVF humphrey visual field; GVF goldmann visual field; OCT optical coherence tomography; IOP intraocular pressure; BRVO Branch retinal vein occlusion; CRVO central retinal vein occlusion; CRAO central retinal artery occlusion; BRAO branch retinal artery occlusion; RT retinal tear; SB scleral buckle; PPV pars plana vitrectomy; VH Vitreous hemorrhage; PRP panretinal laser photocoagulation; IVK intravitreal kenalog; VMT vitreomacular traction; MH Macular hole;  NVD neovascularization of the disc; NVE neovascularization elsewhere; AREDS age related eye disease study; ARMD age related macular degeneration; POAG primary open angle glaucoma; EBMD epithelial/anterior basement membrane dystrophy; ACIOL anterior chamber intraocular lens; IOL intraocular lens; PCIOL posterior chamber intraocular lens; Phaco/IOL phacoemulsification with intraocular lens placement; PRK photorefractive keratectomy; LASIK laser assisted in situ keratomileusis; HTN hypertension; DM diabetes mellitus; COPD chronic obstructive pulmonary disease

## 2022-06-23 ENCOUNTER — Ambulatory Visit (INDEPENDENT_AMBULATORY_CARE_PROVIDER_SITE_OTHER): Payer: 59 | Admitting: Ophthalmology

## 2022-06-23 DIAGNOSIS — H4312 Vitreous hemorrhage, left eye: Secondary | ICD-10-CM | POA: Diagnosis not present

## 2022-06-23 DIAGNOSIS — H3562 Retinal hemorrhage, left eye: Secondary | ICD-10-CM

## 2022-06-23 DIAGNOSIS — I608 Other nontraumatic subarachnoid hemorrhage: Secondary | ICD-10-CM | POA: Diagnosis not present

## 2022-06-23 DIAGNOSIS — H3581 Retinal edema: Secondary | ICD-10-CM

## 2022-06-26 ENCOUNTER — Encounter (INDEPENDENT_AMBULATORY_CARE_PROVIDER_SITE_OTHER): Payer: Self-pay | Admitting: Ophthalmology

## 2022-07-07 ENCOUNTER — Encounter (INDEPENDENT_AMBULATORY_CARE_PROVIDER_SITE_OTHER): Payer: Self-pay | Admitting: Ophthalmology

## 2022-07-07 ENCOUNTER — Ambulatory Visit (INDEPENDENT_AMBULATORY_CARE_PROVIDER_SITE_OTHER): Payer: 59 | Admitting: Ophthalmology

## 2022-07-07 DIAGNOSIS — H4312 Vitreous hemorrhage, left eye: Secondary | ICD-10-CM | POA: Diagnosis not present

## 2022-07-07 DIAGNOSIS — I608 Other nontraumatic subarachnoid hemorrhage: Secondary | ICD-10-CM | POA: Diagnosis not present

## 2022-07-07 DIAGNOSIS — H3562 Retinal hemorrhage, left eye: Secondary | ICD-10-CM | POA: Diagnosis not present

## 2022-07-07 NOTE — Progress Notes (Signed)
[facility] - Clinic Note  07/07/2022     CHIEF COMPLAINT Patient presents for Retina Follow Up   HISTORY OF PRESENT ILLNESS: Sabrina Diaz is a 26 y.o. female who presents to the clinic today for:   HPI     Retina Follow Up   Patient presents with  Other.  In left eye.  This started 2 weeks ago.  I, the attending physician,  performed the HPI with the patient and updated documentation appropriately.        Comments   Patient here for 2 weeks retina follow up for VH OS. Patient states vision getting better in OS. Sometimes has eye pain. Not so much not often.      Last edited by Rennis Chris, MD on 07/08/2022  2:29 AM.     Pt states blood seems to be clearing, the black mass has gotten smaller and she is able to see out of her nasal peripheral vision now, she states her headaches have improved  Referring physician: Nino Glow, PA-C [address] [place],  [place] [address]  HISTORICAL INFORMATION:   Selected notes from the MEDICAL RECORD NUMBER Referred by Dr. Zenaida Niece LEE:  Ocular Hx- Vitreous Hemorrhage OS PMH-    CURRENT MEDICATIONS: No current outpatient medications on file. (Ophthalmic Drugs)   No current facility-administered medications for this visit. (Ophthalmic Drugs)   Current Outpatient Medications (Other)  Medication Sig   Calcium Carbonate Antacid (TUMS PO) Take 2 tablets by mouth 2 (two) times daily as needed (acid reflux/heartburn).   cyclobenzaprine (FLEXERIL) 10 MG tablet Take 10 mg by mouth 3 (three) times daily as needed for muscle spasms.   ibuprofen (ADVIL) 200 MG tablet Take 800 mg by mouth every 4 (four) hours as needed for headache or moderate pain.   ondansetron (ZOFRAN-ODT) 4 MG disintegrating tablet Take 1 tablet (4 mg total) by mouth every 12 (twelve) hours as needed for nausea.   oxyCODONE-acetaminophen (PERCOCET) 5-325 MG tablet Take 1 tablet by mouth every 4 (four) hours as needed (pain).   zonisamide  (ZONEGRAN) 100 MG capsule Take 2 capsules (200 mg total) by mouth daily. Take 100 mg (1 pill) daily for two weeks, then increase to 200 mg (2 pills) daily. (Patient taking differently: Take 100 mg by mouth 2 (two) times daily. For headaches)   traZODone (DESYREL) 100 MG tablet Take 1 tablet (100 mg total) by mouth at bedtime. (Patient not taking: Reported on 06/06/2022)   No current facility-administered medications for this visit. (Other)    REVIEW OF SYSTEMS: ROS   Positive for: Neurological, Eyes Negative for: Constitutional, Gastrointestinal, Skin, Genitourinary, Musculoskeletal, HENT, Endocrine, Cardiovascular, Respiratory, Psychiatric, Allergic/Imm, Heme/Lymph Last edited by Laddie Aquas, COA on 07/07/2022  2:49 PM.     ALLERGIES No Known Allergies  PAST MEDICAL HISTORY Past Medical History:  Diagnosis Date   Anxiety    Chiari malformation type I (HCC)    Chronic headaches    Past Surgical History:  Procedure Laterality Date   APPLICATION OF CRANIAL NAVIGATION  06/13/2022   Procedure: APPLICATION OF CRANIAL NAVIGATION;  Surgeon: Jadene Pierini, MD;  Location: [facility] OR;  Service: Neurosurgery;;   SHUNT REMOVAL N/A 06/13/2022   Procedure: Lumboperitoneal Shunt Removal;  Surgeon: Jadene Pierini, MD;  Location: [facility] OR;  Service: Neurosurgery;  Laterality: N/A;   SUBOCCIPITAL CRANIECTOMY CERVICAL LAMINECTOMY N/A 04/03/2022   Procedure: Chiari decompression;  Surgeon: Jadene Pierini, MD;  Location: [facility] OR;  Service:  Neurosurgery;  Laterality: N/A;  RM 21   VENTRICULOPERITONEAL SHUNT N/A 04/29/2022   Procedure: LUMBOPERITONEAL SHUNT PLACEMENT;  Surgeon: Jadene Pierini, MD;  Location: [facility] OR;  Service: Neurosurgery;  Laterality: N/A;   VENTRICULOPERITONEAL SHUNT Right 06/13/2022   Procedure: Right Ventriculoperitoneal Shunt Placement;  Surgeon: Jadene Pierini, MD;  Location: [facility] OR;  Service: Neurosurgery;  Laterality: Right;    FAMILY HISTORY History reviewed. No  pertinent family history.  SOCIAL HISTORY Social History   Tobacco Use   Smoking status: Former    Types: Cigarettes    Quit date: 07/2021    Years since quitting: 1.0   Smokeless tobacco: Never  Vaping Use   Vaping Use: Every day  Substance Use Topics   Alcohol use: Yes    Comment: socially   Drug use: Not Currently    Types: Marijuana    Comment: Last use 12/2021       OPHTHALMIC EXAM:  Base Eye Exam     Visual Acuity (Snellen - Linear)       Right Left   Dist [place] 20/50 20/800   Dist ph [place] 20/25 NI  OS looking to the side.        Tonometry (Tonopen, 2:44 PM)       Right Left   Pressure 15 10         Pupils       Dark Light Shape React APD   Right 5 4 Round Brisk None   Left 5 4 Round Minimal None         Visual Fields (Counting fingers)       Left Right     Full   Restrictions Total superior temporal, inferior temporal deficiencies; Partial inner superior nasal, inferior nasal deficiencies          Extraocular Movement       Right Left    Full, Ortho Full, Ortho         Neuro/Psych     Oriented x3: Yes   Mood/Affect: Normal         Dilation     Both eyes: 1.0% Mydriacyl, 2.5% Phenylephrine @ 2:44 PM           Slit Lamp and Fundus Exam     External Exam       Right Left   External Normal Normal         Slit Lamp Exam       Right Left   Lids/Lashes Mild MGB Mild MGB   Conjunctiva/Sclera White and quiet White and quiet   Cornea Debris in tear film Debris in tear film   Anterior Chamber Deep and quiet Deep and quiet   Iris Round and reactive. Positive PPM Round and reactive. Positive PPM   Lens Clear Clear   Anterior Vitreous Normal +RBC's, old blot clots settled inferiorly turning white         Fundus Exam       Right Left   Disc Pink and sharp, Compact Pink and sharp, vascular loops   C/D Ratio 0.1 0.2   Macula Flat, Good foveal reflex, No heme, No edema Grossly flat, partially obscured by large  subhyaloid heme -- interval improvement in heme density   Vessels Normal Vascular attenuation   Periphery Attached Attached, obscured by VH/blots clots           IMAGING AND PROCEDURES  Imaging and Procedures for 07/07/2022  OCT, Retina - OU - Both Eyes  Right Eye Quality was good. Central Foveal Thickness: 273. Progression has been stable. Findings include normal foveal contour, no IRF, no SRF, vitreomacular adhesion .   Left Eye Quality was good. Progression has improved. Findings include abnormal foveal contour (Inferior macula obscured by large subhyaloid heme, superior macula appears normal, ?interval improvement in vitreous opacities).   Notes *Images captured and stored on drive  Diagnosis / Impression:  OD: NFP; no IRF/SRF  OS: Inferior macula obscured by large subhyaloid heme, superior macula appears normal, ?interval improvement in vitreous opacities  Clinical management:  See below  Abbreviations: NFP - Normal foveal profile. CME - cystoid macular edema. PED - pigment epithelial detachment. IRF - intraretinal fluid. SRF - subretinal fluid. EZ - ellipsoid zone. ERM - epiretinal membrane. ORA - outer retinal atrophy. ORT - outer retinal tubulation. SRHM - subretinal hyper-reflective material. IRHM - intraretinal hyper-reflective material      Color Fundus Photography Optos - OU - Both Eyes       Right Eye Progression has no prior data. Disc findings include normal observations. Macula : normal observations. Vessels : normal observations. Periphery : normal observations.   Left Eye Progression has no prior data. Disc findings include hemorrhage (Hazy view). Macula : hemorrhage (Subhyaloid heme, obscuring majority of macula). Vessels : normal observations. Periphery : hemorrhage (Old, white VH settled inferiorly).   Notes **Images stored on drive**  Impression: OD: normal study OS: Large subhyaloid heme            ASSESSMENT/PLAN:    ICD-10-CM   1.  Vitreous hemorrhage of left eye (HCC)  H43.12 OCT, Retina - OU - Both Eyes    Color Fundus Photography Optos - OU - Both Eyes    2. 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 Vitreous and subhyaloid hemorrhage / Terson's syndrome OS  - large central subhyaloid heme -- onset around 7/3  - history Chiari malformation -- had VP shunt placed 5.27.23  - had syncopal episode on 7.3.23 and came to w/ decreased central vision OS  - was followed by Dr. Lucianne Lei during hospitalization - VA improved to 20/800 from CF @ face  - IOP OS 10 - discussed findings, prognosis, and treatment options - VH precautions reviewed -- minimize activities, keep head elevated, avoid ASA/NSAIDs/blood thinners as able  - Patient was advised to observe and monitor at this time. If blood does not resolve will consider surgery.  - Patient was instructed to sleep with her head elevated.  - Monitor closely return in 1-2 weeks, DFE, OCT.   Ophthalmic Meds Ordered this visit:  No orders of the defined types were placed in this encounter.     Return for f/u 1-2 weeks, VH OS, DFE, OCT.  There are no Patient Instructions on file for this visit.   Explained the diagnoses, plan, and follow up with the patient and they expressed understanding.  Patient expressed understanding of the importance of proper follow up care.   This document serves as a record of services personally performed by Gardiner Sleeper, MD, PhD. It was created on their behalf by San Jetty. Owens Shark, OA an ophthalmic technician. The creation of this record is the provider's dictation and/or activities during  the visit.    Electronically signed by: San Jetty. Owens Shark, [place] 08.04.2023 2:32 AM   Gardiner Sleeper, M.D., Ph.D. Diseases & Surgery of the Retina and Vitreous [facility]  I have reviewed the above documentation for accuracy and completeness, and I agree with the above. Gardiner Sleeper, M.D., Ph.D. 07/08/22 2:36 AM   Abbreviations: M myopia (nearsighted); A astigmatism; H hyperopia (farsighted); P presbyopia; Mrx spectacle prescription;  CTL contact lenses; OD right eye; OS left eye; OU both eyes  XT exotropia; ET esotropia; PEK punctate epithelial keratitis; PEE punctate epithelial erosions; DES dry eye syndrome; MGD meibomian gland dysfunction; ATs artificial tears; PFAT's preservative free artificial tears; [place] nuclear sclerotic cataract; PSC posterior subcapsular cataract; ERM epi-retinal membrane; PVD posterior vitreous detachment; RD retinal detachment; DM diabetes mellitus; DR diabetic retinopathy; NPDR non-proliferative diabetic retinopathy; PDR proliferative diabetic retinopathy; CSME clinically significant macular edema; DME diabetic macular edema; dbh dot blot hemorrhages; CWS cotton wool spot; POAG primary open angle glaucoma; C/D cup-to-disc ratio; HVF humphrey visual field; GVF goldmann visual field; OCT optical coherence tomography; IOP intraocular pressure; BRVO Branch retinal vein occlusion; CRVO central retinal vein occlusion; CRAO central retinal artery occlusion; BRAO branch retinal artery occlusion; RT retinal tear; SB scleral buckle; PPV pars plana vitrectomy; VH Vitreous hemorrhage; PRP panretinal laser photocoagulation; IVK intravitreal kenalog; VMT vitreomacular traction; MH Macular hole;  NVD neovascularization of the disc; NVE neovascularization elsewhere; AREDS age related eye disease study; ARMD age related macular degeneration; POAG primary open angle glaucoma; EBMD epithelial/anterior basement membrane dystrophy; ACIOL anterior chamber intraocular lens; IOL intraocular lens; PCIOL posterior chamber intraocular lens; Phaco/IOL phacoemulsification with intraocular lens placement; [place] photorefractive keratectomy; LASIK  laser assisted in situ keratomileusis; HTN hypertension; DM diabetes mellitus; COPD chronic obstructive pulmonary disease

## 2022-07-13 NOTE — Progress Notes (Signed)
[facility] - Clinic Note  07/17/2022     CHIEF COMPLAINT Patient presents for Retina Follow Up   HISTORY OF PRESENT ILLNESS: Sabrina Diaz is a 26 y.o. female who presents to the clinic today for:   HPI     Retina Follow Up   Patient presents with  Other.  In left eye.  This started 1.5 weeks ago.  I, the attending physician,  performed the HPI with the patient and updated documentation appropriately.        Comments   Patient here for 1.5 weeks retina follow up for VH OS. Patient states vision is slowly getting better. The mass that is at the top. Not the whole eye now. Sometimes has eye pain. Come and goes. Doesn't last long.       Last edited by Rennis Chris, MD on 07/17/2022  6:00 PM.    Pt states vision is slowly getting better  Referring physician: Nino Glow, PA-C [address] [place],  [place] [address]  HISTORICAL INFORMATION:   Selected notes from the MEDICAL RECORD NUMBER Referred by Dr. Zenaida Niece LEE:  Ocular Hx- Vitreous Hemorrhage OS PMH-    CURRENT MEDICATIONS: No current outpatient medications on file. (Ophthalmic Drugs)   No current facility-administered medications for this visit. (Ophthalmic Drugs)   Current Outpatient Medications (Other)  Medication Sig   Calcium Carbonate Antacid (TUMS PO) Take 2 tablets by mouth 2 (two) times daily as needed (acid reflux/heartburn).   cyclobenzaprine (FLEXERIL) 10 MG tablet Take 10 mg by mouth 3 (three) times daily as needed for muscle spasms.   ibuprofen (ADVIL) 200 MG tablet Take 800 mg by mouth every 4 (four) hours as needed for headache or moderate pain.   ondansetron (ZOFRAN-ODT) 4 MG disintegrating tablet Take 1 tablet (4 mg total) by mouth every 12 (twelve) hours as needed for nausea.   oxyCODONE-acetaminophen (PERCOCET) 5-325 MG tablet Take 1 tablet by mouth every 4 (four) hours as needed (pain).   zonisamide (ZONEGRAN) 100 MG capsule Take 2 capsules (200 mg total) by  mouth daily. Take 100 mg (1 pill) daily for two weeks, then increase to 200 mg (2 pills) daily. (Patient taking differently: Take 100 mg by mouth 2 (two) times daily. For headaches)   traZODone (DESYREL) 100 MG tablet Take 1 tablet (100 mg total) by mouth at bedtime. (Patient not taking: Reported on 06/06/2022)   No current facility-administered medications for this visit. (Other)    REVIEW OF SYSTEMS: ROS   Positive for: Neurological, Eyes Negative for: Constitutional, Gastrointestinal, Skin, Genitourinary, Musculoskeletal, HENT, Endocrine, Cardiovascular, Respiratory, Psychiatric, Allergic/Imm, Heme/Lymph Last edited by Laddie Aquas, COA on 07/17/2022  3:24 PM.     ALLERGIES No Known Allergies  PAST MEDICAL HISTORY Past Medical History:  Diagnosis Date   Anxiety    Chiari malformation type I (HCC)    Chronic headaches    Past Surgical History:  Procedure Laterality Date   APPLICATION OF CRANIAL NAVIGATION  06/13/2022   Procedure: APPLICATION OF CRANIAL NAVIGATION;  Surgeon: Jadene Pierini, MD;  Location: [facility] OR;  Service: Neurosurgery;;   SHUNT REMOVAL N/A 06/13/2022   Procedure: Lumboperitoneal Shunt Removal;  Surgeon: Jadene Pierini, MD;  Location: [facility] OR;  Service: Neurosurgery;  Laterality: N/A;   SUBOCCIPITAL CRANIECTOMY CERVICAL LAMINECTOMY N/A 04/03/2022   Procedure: Chiari decompression;  Surgeon: Jadene Pierini, MD;  Location: [facility] OR;  Service: Neurosurgery;  Laterality: N/A;  RM 21   VENTRICULOPERITONEAL SHUNT N/A  04/29/2022   Procedure: LUMBOPERITONEAL SHUNT PLACEMENT;  Surgeon: Jadene Pierini, MD;  Location: [facility] OR;  Service: Neurosurgery;  Laterality: N/A;   VENTRICULOPERITONEAL SHUNT Right 06/13/2022   Procedure: Right Ventriculoperitoneal Shunt Placement;  Surgeon: Jadene Pierini, MD;  Location: [facility] OR;  Service: Neurosurgery;  Laterality: Right;   FAMILY HISTORY History reviewed. No pertinent family history.  SOCIAL HISTORY Social History    Tobacco Use   Smoking status: Former    Types: Cigarettes    Quit date: 07/2021    Years since quitting: 1.0   Smokeless tobacco: Never  Vaping Use   Vaping Use: Every day  Substance Use Topics   Alcohol use: Yes    Comment: socially   Drug use: Not Currently    Types: Marijuana    Comment: Last use 12/2021       OPHTHALMIC EXAM:  Base Eye Exam     Visual Acuity (Snellen - Linear)       Right Left   Dist [place] 20/60 20/400 -1   Dist ph [place] 20/20 20/350         Tonometry (Tonopen, 3:20 PM)       Right Left   Pressure 16 11         Pupils       Dark Light Shape React APD   Right 5 4 Round Brisk None   Left 5 4 Round Minimal None         Visual Fields (Counting fingers)       Left Right     Full   Restrictions Total superior temporal, inferior temporal deficiencies; Partial inner superior nasal, inferior nasal deficiencies          Extraocular Movement       Right Left    Full, Ortho Full, Ortho         Neuro/Psych     Oriented x3: Yes   Mood/Affect: Normal         Dilation     Both eyes: 1.0% Mydriacyl, 2.5% Phenylephrine @ 3:20 PM           Slit Lamp and Fundus Exam     External Exam       Right Left   External Normal Normal         Slit Lamp Exam       Right Left   Lids/Lashes Mild MGD Mild MGD   Conjunctiva/Sclera White and quiet White and quiet   Cornea Debris in tear film Clear   Anterior Chamber Deep and quiet Deep and quiet   Iris Positive PPM, Round and dilated Round and dilated   Lens Clear Clear   Anterior Vitreous Normal +RBC's, blood stained vit condensations; old blot clots settled inferiorly turning white         Fundus Exam       Right Left   Disc Pink and sharp, Compact Very hazy view, Pink and sharp, vascular loops, +heme   C/D Ratio 0.1 0.2   Macula Flat, Good foveal reflex, No heme, No edema Hazy view, Grossly flat, partially obscured by large subhyaloid heme -- interval improvement in heme  density   Vessels Normal attenuated, mild tortuosity   Periphery Attached Attached, obscured by white VH and blots clots / preretinal heme           IMAGING AND PROCEDURES  Imaging and Procedures for 07/17/2022  OCT, Retina - OU - Both Eyes       Right Eye Quality  was good. Central Foveal Thickness: 271. Progression has been stable. Findings include normal foveal contour, no IRF, no SRF, vitreomacular adhesion .   Left Eye Quality was good. Progression has improved. Findings include abnormal foveal contour (Inferior macula obscured by large subhyaloid heme, superior macula appears normal, ?interval improvement in vitreous opacities / subhyaloid heme).   Notes *Images captured and stored on drive  Diagnosis / Impression:  OD: NFP; no IRF/SRF  OS: Inferior macula obscured by large subhyaloid heme, superior macula appears normal, ?interval improvement in vitreous opacities / subhyaloid heme  Clinical management:  See below  Abbreviations: NFP - Normal foveal profile. CME - cystoid macular edema. PED - pigment epithelial detachment. IRF - intraretinal fluid. SRF - subretinal fluid. EZ - ellipsoid zone. ERM - epiretinal membrane. ORA - outer retinal atrophy. ORT - outer retinal tubulation. SRHM - subretinal hyper-reflective material. IRHM - intraretinal hyper-reflective material            ASSESSMENT/PLAN:    ICD-10-CM   1. Vitreous hemorrhage of left eye (HCC)  H43.12 OCT, Retina - OU - Both Eyes    2. 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 Vitreous and subhyaloid hemorrhage / Terson's syndrome OS  - large central subhyaloid heme -- onset around 7/3  - history Chiari malformation -- had VP shunt placed 5.27.23  - had syncopal episode on 7.3.23 and came to w/ decreased central vision OS  - was followed by Dr. Zenaida Niece during hospitalization - VA improved to 20/350 from 20/800 from CF @ face  - IOP OS  10 - discussed findings, prognosis, and treatment options - VH precautions reviewed -- minimize activities, keep head elevated, avoid ASA/NSAIDs/blood thinners as able  - Patient was advised to observe and monitor at this time. If blood does not resolve will consider surgery.  - Patient was instructed to sleep with her head elevated.  - Monitor closely  - return in 2 weeks, DFE, OCT.  Ophthalmic Meds Ordered this visit:  No orders of the defined types were placed in this encounter.    Return in about 2 weeks (around 07/31/2022) for f/u VH OS, DFE, OCT.  There are no Patient Instructions on file for this visit.  Explained the diagnoses, plan, and follow up with the patient and they expressed understanding.  Patient expressed understanding of the importance of proper follow up care.   This document serves as a record of services personally performed by Karie Chimera, MD, PhD. It was created on their behalf by Annalee Genta, COMT. The creation of this record is the provider's dictation and/or activities during the visit.  Electronically signed by: Annalee Genta, COMT 07/17/22 6:01 PM  Karie Chimera, M.D., Ph.D. Diseases & Surgery of the Retina and Vitreous [facility]  I have reviewed the above documentation for accuracy and completeness, and I agree with the above. Karie Chimera, M.D., Ph.D. 07/17/22 6:03 PM  Abbreviations: M myopia (nearsighted); A astigmatism; H hyperopia (farsighted); P presbyopia; Mrx spectacle prescription;  CTL contact lenses; OD right eye; OS left eye; OU both eyes  XT exotropia; ET esotropia; PEK punctate epithelial keratitis; PEE punctate epithelial erosions; DES dry eye syndrome; MGD meibomian gland dysfunction; ATs artificial tears; PFAT's preservative free artificial tears; NSC nuclear sclerotic cataract; PSC posterior subcapsular cataract; ERM epi-retinal membrane; PVD posterior vitreous detachment; RD retinal  detachment; DM diabetes  mellitus; DR diabetic retinopathy; NPDR non-proliferative diabetic retinopathy; PDR proliferative diabetic retinopathy; CSME clinically significant macular edema; DME diabetic macular edema; dbh dot blot hemorrhages; CWS cotton wool spot; POAG primary open angle glaucoma; C/D cup-to-disc ratio; HVF humphrey visual field; GVF goldmann visual field; OCT optical coherence tomography; IOP intraocular pressure; BRVO Branch retinal vein occlusion; CRVO central retinal vein occlusion; CRAO central retinal artery occlusion; BRAO branch retinal artery occlusion; RT retinal tear; SB scleral buckle; PPV pars plana vitrectomy; VH Vitreous hemorrhage; PRP panretinal laser photocoagulation; IVK intravitreal kenalog; VMT vitreomacular traction; MH Macular hole;  NVD neovascularization of the disc; NVE neovascularization elsewhere; AREDS age related eye disease study; ARMD age related macular degeneration; POAG primary open angle glaucoma; EBMD epithelial/anterior basement membrane dystrophy; ACIOL anterior chamber intraocular lens; IOL intraocular lens; PCIOL posterior chamber intraocular lens; Phaco/IOL phacoemulsification with intraocular lens placement; PRK photorefractive keratectomy; LASIK laser assisted in situ keratomileusis; HTN hypertension; DM diabetes mellitus; COPD chronic obstructive pulmonary disease

## 2022-07-17 ENCOUNTER — Encounter (INDEPENDENT_AMBULATORY_CARE_PROVIDER_SITE_OTHER): Payer: Self-pay | Admitting: Ophthalmology

## 2022-07-17 ENCOUNTER — Ambulatory Visit (INDEPENDENT_AMBULATORY_CARE_PROVIDER_SITE_OTHER): Payer: 59 | Admitting: Ophthalmology

## 2022-07-17 DIAGNOSIS — H3562 Retinal hemorrhage, left eye: Secondary | ICD-10-CM

## 2022-07-17 DIAGNOSIS — I608 Other nontraumatic subarachnoid hemorrhage: Secondary | ICD-10-CM

## 2022-07-17 DIAGNOSIS — H4312 Vitreous hemorrhage, left eye: Secondary | ICD-10-CM | POA: Diagnosis not present

## 2022-07-28 NOTE — Progress Notes (Shared)
[facility] - Clinic Note  07/31/2022     CHIEF COMPLAINT Patient presents for Retina Follow Up   HISTORY OF PRESENT ILLNESS: Sabrina Diaz is a 26 y.o. female who presents to the clinic today for:   HPI     Retina Follow Up   Patient presents with  Other.  In left eye.  This started months ago.  Duration of 2 weeks.  I, the attending physician,  performed the HPI with the patient and updated documentation appropriately.        Comments   Patient feels that the vision has not improved much. The black mass has gotten lighter but still there.       Last edited by Rennis Chris, MD on 07/31/2022 11:34 PM.     Pt feels like the "black mass" has fully dissipated, but still feels like she is looking through water, she is sleeping with her head elevated, pt has neuro follow up on Friday  Referring physician: Nino Glow, PA-C [address] [place],  [place] [address]  HISTORICAL INFORMATION:   Selected notes from the MEDICAL RECORD NUMBER Referred by Dr. Zenaida Niece LEE:  Ocular Hx- Vitreous Hemorrhage OS PMH-    CURRENT MEDICATIONS: No current outpatient medications on file. (Ophthalmic Drugs)   No current facility-administered medications for this visit. (Ophthalmic Drugs)   Current Outpatient Medications (Other)  Medication Sig   Calcium Carbonate Antacid (TUMS PO) Take 2 tablets by mouth 2 (two) times daily as needed (acid reflux/heartburn).   cyclobenzaprine (FLEXERIL) 10 MG tablet Take 10 mg by mouth 3 (three) times daily as needed for muscle spasms.   ibuprofen (ADVIL) 200 MG tablet Take 800 mg by mouth every 4 (four) hours as needed for headache or moderate pain.   ondansetron (ZOFRAN-ODT) 4 MG disintegrating tablet Take 1 tablet (4 mg total) by mouth every 12 (twelve) hours as needed for nausea.   oxyCODONE-acetaminophen (PERCOCET) 5-325 MG tablet Take 1 tablet by mouth every 4 (four) hours as needed (pain).   traZODone (DESYREL) 100 MG  tablet Take 1 tablet (100 mg total) by mouth at bedtime. (Patient not taking: Reported on 06/06/2022)   zonisamide (ZONEGRAN) 100 MG capsule Take 2 capsules (200 mg total) by mouth daily. Take 100 mg (1 pill) daily for two weeks, then increase to 200 mg (2 pills) daily. (Patient taking differently: Take 100 mg by mouth 2 (two) times daily. For headaches)   No current facility-administered medications for this visit. (Other)    REVIEW OF SYSTEMS: ROS   Positive for: Neurological, Eyes Negative for: Constitutional, Gastrointestinal, Skin, Genitourinary, Musculoskeletal, HENT, Endocrine, Cardiovascular, Respiratory, Psychiatric, Allergic/Imm, Heme/Lymph Last edited by Julieanne Cotton, COT on 07/31/2022  2:04 PM.      ALLERGIES No Known Allergies  PAST MEDICAL HISTORY Past Medical History:  Diagnosis Date   Anxiety    Chiari malformation type I (HCC)    Chronic headaches    Past Surgical History:  Procedure Laterality Date   APPLICATION OF CRANIAL NAVIGATION  06/13/2022   Procedure: APPLICATION OF CRANIAL NAVIGATION;  Surgeon: Jadene Pierini, MD;  Location: [facility] OR;  Service: Neurosurgery;;   SHUNT REMOVAL N/A 06/13/2022   Procedure: Lumboperitoneal Shunt Removal;  Surgeon: Jadene Pierini, MD;  Location: [facility] OR;  Service: Neurosurgery;  Laterality: N/A;   SUBOCCIPITAL CRANIECTOMY CERVICAL LAMINECTOMY N/A 04/03/2022   Procedure: Chiari decompression;  Surgeon: Jadene Pierini, MD;  Location: [facility] OR;  Service: Neurosurgery;  Laterality: N/A;  RM 21   VENTRICULOPERITONEAL SHUNT N/A 04/29/2022   Procedure: LUMBOPERITONEAL SHUNT PLACEMENT;  Surgeon: Jadene Pierini, MD;  Location: [facility] OR;  Service: Neurosurgery;  Laterality: N/A;   VENTRICULOPERITONEAL SHUNT Right 06/13/2022   Procedure: Right Ventriculoperitoneal Shunt Placement;  Surgeon: Jadene Pierini, MD;  Location: [facility] OR;  Service: Neurosurgery;  Laterality: Right;   FAMILY HISTORY History reviewed. No pertinent  family history.  SOCIAL HISTORY Social History   Tobacco Use   Smoking status: Former    Types: Cigarettes    Quit date: 07/2021    Years since quitting: 1.0   Smokeless tobacco: Never  Vaping Use   Vaping Use: Every day  Substance Use Topics   Alcohol use: Yes    Comment: socially   Drug use: Not Currently    Types: Marijuana    Comment: Last use 12/2021       OPHTHALMIC EXAM:  Base Eye Exam     Visual Acuity (Snellen - Linear)       Right Left   Dist [place] 20/40 CF at 3'   Dist ph [place] 20/20 NI         Tonometry (Tonopen, 2:09 PM)       Right Left   Pressure 14 10         Pupils       Dark Light Shape React APD   Right 5 4 Round Brisk None   Left 5 4 Round Minimal None         Visual Fields       Left Right     Full   Restrictions Total superior temporal, inferior temporal deficiencies; Partial inner superior nasal, inferior nasal deficiencies          Extraocular Movement       Right Left    Full, Ortho Full, Ortho         Neuro/Psych     Oriented x3: Yes   Mood/Affect: Normal         Dilation     Both eyes: 1.0% Mydriacyl, 2.5% Phenylephrine @ 2:06 PM           Slit Lamp and Fundus Exam     External Exam       Right Left   External Normal Normal         Slit Lamp Exam       Right Left   Lids/Lashes Mild MGD Mild MGD   Conjunctiva/Sclera White and quiet White and quiet   Cornea Debris in tear film Clear   Anterior Chamber Deep and quiet Deep and quiet   Iris Positive PPM, Round and dilated Round and dilated   Lens Clear Clear   Anterior Vitreous Normal +RBC's, blood stained vit condensations -- improving; old blot clots settled inferiorly turning white         Fundus Exam       Right Left   Disc Pink and sharp, Compact Very hazy view, Pink and sharp, vascular loops, +heme   C/D Ratio 0.1 0.2   Macula Flat, Good foveal reflex, No heme, No edema Hazy view, Grossly flat, partially obscured by large subhyaloid  heme -- ?mild improvement in heme density -- still very dense   Vessels Normal attenuated, mild tortuosity   Periphery Attached Attached, obscured by white VH and blots clots / preretinal heme           Refraction     Manifest Refraction       Sphere  Cylinder Dist VA   Right -1.25 Sphere 20/20   Left              IMAGING AND PROCEDURES  Imaging and Procedures for 07/31/2022  OCT, Retina - OU - Both Eyes       Right Eye Quality was good. Central Foveal Thickness: 275. Progression has been stable. Findings include normal foveal contour, no IRF, no SRF, vitreomacular adhesion .   Left Eye Quality was poor. Progression has been stable. Findings include abnormal foveal contour (Inferior macula obscured by large subhyaloid heme, superior macula appears normal, ?interval improvement in vitreous opacities / subhyaloid heme).   Notes *Images captured and stored on drive  Diagnosis / Impression:  OD: NFP; no IRF/SRF  OS: Inferior macula obscured by large subhyaloid heme--minimal interval improvement, superior macula appears normal, interval improvement in vitreous opacities  Clinical management:  See below  Abbreviations: NFP - Normal foveal profile. CME - cystoid macular edema. PED - pigment epithelial detachment. IRF - intraretinal fluid. SRF - subretinal fluid. EZ - ellipsoid zone. ERM - epiretinal membrane. ORA - outer retinal atrophy. ORT - outer retinal tubulation. SRHM - subretinal hyper-reflective material. IRHM - intraretinal hyper-reflective material             ASSESSMENT/PLAN:    ICD-10-CM   1. Vitreous hemorrhage of left eye (HCC)  H43.12 OCT, Retina - OU - Both Eyes    2. 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 Vitreous and subhyaloid hemorrhage / Terson's syndrome OS  - large central subhyaloid heme -- onset around 7/3  - history of Chiari malformation -- had VP shunt placed  5.27.23  - had syncopal episode on 7.3.23 and came to w/ decreased central vision OS  - was followed by Dr. Zenaida Niece during hospitalization - VA back to CF from 20/350 from 20/800 from CF @ face  - IOP OS 10 - discussed findings, prognosis, and treatment options - VH precautions reviewed -- minimize activities, keep head elevated, avoid ASA/NSAIDs/blood thinners as able  - Patient was advised to observe and monitor at this time. If blood does not resolve will consider surgery.  - Patient was instructed to sleep with her head elevated.  - Monitor closely  - return in 2 weeks, DFE, OCT.  Ophthalmic Meds Ordered this visit:  No orders of the defined types were placed in this encounter.    Return in about 2 weeks (around 08/14/2022) for f/u VH OS, DFE, OCT.  There are no Patient Instructions on file for this visit.  Explained the diagnoses, plan, and follow up with the patient and they expressed understanding.  Patient expressed understanding of the importance of proper follow up care.   This document serves as a record of services personally performed by Karie Chimera, MD, PhD. It was created on their behalf by Annalee Genta, COMT. The creation of this record is the provider's dictation and/or activities during the visit.  Electronically signed by: Annalee Genta, COMT 07/31/22 11:49 PM  This document serves as a record of services personally performed by Karie Chimera, MD, PhD. It was created on their behalf by Glee Arvin. Manson Passey, OA an ophthalmic technician. The creation of this record is the provider's dictation and/or activities during the visit.    Electronically signed by: Glee Arvin. Manson Passey, [place] 08.28.2023 11:49 PM  Karie Chimera, M.D.,  Ph.D. Diseases & Surgery of the Retina and Vitreous [facility]  I have reviewed the above documentation for accuracy and completeness, and I agree with the above. Karie Chimera, M.D., Ph.D. 07/31/22 11:52 PM   Abbreviations: M  myopia (nearsighted); A astigmatism; H hyperopia (farsighted); P presbyopia; Mrx spectacle prescription;  CTL contact lenses; OD right eye; OS left eye; OU both eyes  XT exotropia; ET esotropia; PEK punctate epithelial keratitis; PEE punctate epithelial erosions; DES dry eye syndrome; MGD meibomian gland dysfunction; ATs artificial tears; PFAT's preservative free artificial tears; NSC nuclear sclerotic cataract; PSC posterior subcapsular cataract; ERM epi-retinal membrane; PVD posterior vitreous detachment; RD retinal detachment; DM diabetes mellitus; DR diabetic retinopathy; NPDR non-proliferative diabetic retinopathy; PDR proliferative diabetic retinopathy; CSME clinically significant macular edema; DME diabetic macular edema; dbh dot blot hemorrhages; CWS cotton wool spot; POAG primary open angle glaucoma; C/D cup-to-disc ratio; HVF humphrey visual field; GVF goldmann visual field; OCT optical coherence tomography; IOP intraocular pressure; BRVO Branch retinal vein occlusion; CRVO central retinal vein occlusion; CRAO central retinal artery occlusion; BRAO branch retinal artery occlusion; RT retinal tear; SB scleral buckle; PPV pars plana vitrectomy; VH Vitreous hemorrhage; PRP panretinal laser photocoagulation; IVK intravitreal kenalog; VMT vitreomacular traction; MH Macular hole;  NVD neovascularization of the disc; NVE neovascularization elsewhere; AREDS age related eye disease study; ARMD age related macular degeneration; POAG primary open angle glaucoma; EBMD epithelial/anterior basement membrane dystrophy; ACIOL anterior chamber intraocular lens; IOL intraocular lens; PCIOL posterior chamber intraocular lens; Phaco/IOL phacoemulsification with intraocular lens placement; PRK photorefractive keratectomy; LASIK laser assisted in situ keratomileusis; HTN hypertension; DM diabetes mellitus; COPD chronic obstructive pulmonary disease

## 2022-07-31 ENCOUNTER — Ambulatory Visit (INDEPENDENT_AMBULATORY_CARE_PROVIDER_SITE_OTHER): Payer: 59 | Admitting: Ophthalmology

## 2022-07-31 ENCOUNTER — Encounter (INDEPENDENT_AMBULATORY_CARE_PROVIDER_SITE_OTHER): Payer: Self-pay | Admitting: Ophthalmology

## 2022-07-31 DIAGNOSIS — H4312 Vitreous hemorrhage, left eye: Secondary | ICD-10-CM | POA: Diagnosis not present

## 2022-07-31 DIAGNOSIS — H3562 Retinal hemorrhage, left eye: Secondary | ICD-10-CM | POA: Diagnosis not present

## 2022-07-31 DIAGNOSIS — I608 Other nontraumatic subarachnoid hemorrhage: Secondary | ICD-10-CM | POA: Diagnosis not present

## 2022-08-10 NOTE — Progress Notes (Signed)
[facility] - Clinic Note  08/11/2022     CHIEF COMPLAINT Patient presents for Retina Follow Up   HISTORY OF PRESENT ILLNESS: Sabrina Diaz is a 26 y.o. female who presents to the clinic today for:   HPI     Retina Follow Up   Patient presents with  Other.  In left eye.  Severity is moderate.  Duration of 2 weeks.  Since onset it is stable.  I, the attending physician,  performed the HPI with the patient and updated documentation appropriately.        Comments   Pt here for 2 wk ret f/u for VH OS. Pt states VA is the same, no change since previous visit.       Last edited by Rennis Chris, MD on 08/11/2022 11:52 PM.    Pt states vision has not cleared up, she had an appt with her neurosurgeon and he said she was good to have sx on her eye, he is happy with the shunt and he doesn't see her again until December  Referring physician: Nino Glow, PA-C [address] [place],  [place] [address]  HISTORICAL INFORMATION:   Selected notes from the MEDICAL RECORD NUMBER Referred by Dr. Zenaida Niece LEE:  Ocular Hx- Vitreous Hemorrhage OS PMH-    CURRENT MEDICATIONS: No current outpatient medications on file. (Ophthalmic Drugs)   No current facility-administered medications for this visit. (Ophthalmic Drugs)   Current Outpatient Medications (Other)  Medication Sig   Calcium Carbonate Antacid (TUMS PO) Take 2 tablets by mouth 2 (two) times daily as needed (acid reflux/heartburn).   cyclobenzaprine (FLEXERIL) 10 MG tablet Take 10 mg by mouth 3 (three) times daily as needed for muscle spasms.   ondansetron (ZOFRAN-ODT) 4 MG disintegrating tablet Take 1 tablet (4 mg total) by mouth every 12 (twelve) hours as needed for nausea. (Patient not taking: Reported on 08/11/2022)   oxyCODONE-acetaminophen (PERCOCET) 5-325 MG tablet Take 1 tablet by mouth every 4 (four) hours as needed (pain). (Patient not taking: Reported on 08/11/2022)   traZODone (DESYREL) 100 MG  tablet Take 1 tablet (100 mg total) by mouth at bedtime. (Patient not taking: Reported on 08/11/2022)   zonisamide (ZONEGRAN) 100 MG capsule Take 2 capsules (200 mg total) by mouth daily. Take 100 mg (1 pill) daily for two weeks, then increase to 200 mg (2 pills) daily. (Patient taking differently: Take 100 mg by mouth 2 (two) times daily. For headaches)   No current facility-administered medications for this visit. (Other)   REVIEW OF SYSTEMS: ROS   Positive for: Neurological, Eyes Negative for: Constitutional, Gastrointestinal, Skin, Genitourinary, Musculoskeletal, HENT, Endocrine, Cardiovascular, Respiratory, Psychiatric, Allergic/Imm, Heme/Lymph Last edited by Thompson Grayer, COT on 08/11/2022  8:41 AM.     ALLERGIES No Known Allergies  PAST MEDICAL HISTORY Past Medical History:  Diagnosis Date   Anxiety    Chiari malformation type I (HCC)    Chronic headaches    Past Surgical History:  Procedure Laterality Date   APPLICATION OF CRANIAL NAVIGATION  06/13/2022   Procedure: APPLICATION OF CRANIAL NAVIGATION;  Surgeon: Jadene Pierini, MD;  Location: [facility] OR;  Service: Neurosurgery;;   SHUNT REMOVAL N/A 06/13/2022   Procedure: Lumboperitoneal Shunt Removal;  Surgeon: Jadene Pierini, MD;  Location: [facility] OR;  Service: Neurosurgery;  Laterality: N/A;   SUBOCCIPITAL CRANIECTOMY CERVICAL LAMINECTOMY N/A 04/03/2022   Procedure: Chiari decompression;  Surgeon: Jadene Pierini, MD;  Location: [facility] OR;  Service: Neurosurgery;  Laterality: N/A;  RM 21   VENTRICULOPERITONEAL SHUNT N/A 04/29/2022   Procedure: LUMBOPERITONEAL SHUNT PLACEMENT;  Surgeon: Judith Part, MD;  Location: [place];  Service: Neurosurgery;  Laterality: N/A;   VENTRICULOPERITONEAL SHUNT Right 06/13/2022   Procedure: Right Ventriculoperitoneal Shunt Placement;  Surgeon: Judith Part, MD;  Location: [place];  Service: Neurosurgery;  Laterality: Right;   FAMILY HISTORY History reviewed. No pertinent family  history.  SOCIAL HISTORY Social History   Tobacco Use   Smoking status: Former    Types: Cigarettes    Quit date: 07/2021    Years since quitting: 1.1   Smokeless tobacco: Never  Vaping Use   Vaping Use: Every day  Substance Use Topics   Alcohol use: Yes    Comment: socially   Drug use: Not Currently    Types: Marijuana    Comment: Last use 12/2021       OPHTHALMIC EXAM:  Base Eye Exam     Visual Acuity (Snellen - Linear)       Right Left   Dist [place] 20/60 CF at 3'   Dist ph  20/25 NI         Tonometry (Tonopen, 8:47 AM)       Right Left   Pressure 18 19         Pupils       Dark Light Shape React APD   Right 5 4 Round Brisk None   Left 5 4 Round Brisk None         Visual Fields (Counting fingers)       Left Right     Full   Restrictions Total superior temporal, inferior temporal deficiencies; Partial inner superior nasal, inferior nasal deficiencies          Extraocular Movement       Right Left    Full, Ortho Full, Ortho         Neuro/Psych     Oriented x3: Yes   Mood/Affect: Normal         Dilation     Both eyes: 1.0% Mydriacyl, 2.5% Phenylephrine @ 8:48 AM           Slit Lamp and Fundus Exam     External Exam       Right Left   External Normal Normal         Slit Lamp Exam       Right Left   Lids/Lashes Mild MGD Mild MGD   Conjunctiva/Sclera White and quiet White and quiet   Cornea Debris in tear film Clear   Anterior Chamber Deep and quiet Deep and quiet   Iris Positive PPM, Round and dilated Round and dilated   Lens Clear Clear   Anterior Vitreous Normal +RBC's, blood stained vit condensations -- improving; old blot clots settled inferiorly turning white         Fundus Exam       Right Left   Disc Pink and sharp, Compact hazy view, Pink and sharp, vascular loops, +heme   C/D Ratio 0.1 0.2   Macula Flat, Good foveal reflex, No heme, No edema Hazy view, Grossly flat, mostly obscured by large  subhyaloid heme -- ?mild improvement in heme density -- still very dense   Vessels Normal attenuated, mild tortuosity   Periphery Attached Attached, inferior periphery obscured by white VH and blots clots / preretinal heme           IMAGING AND PROCEDURES  Imaging and Procedures for  08/11/2022  OCT, Retina - OU - Both Eyes       Right Eye Quality was good. Central Foveal Thickness: 278. Progression has been stable. Findings include normal foveal contour, no IRF, no SRF, vitreomacular adhesion .   Left Eye Quality was poor. Progression has improved. Findings include abnormal foveal contour (Inferior macula obscured by large subhyaloid heme, superior macula appears normal, ?interval improvement in vitreous opacities / subhyaloid heme).   Notes *Images captured and stored on drive  Diagnosis / Impression:  OD: NFP; no IRF/SRF  OS: Inferior macula obscured by large subhyaloid heme--minimal interval improvement, superior macula appears normal, interval improvement in vitreous opacities  Clinical management:  See below  Abbreviations: NFP - Normal foveal profile. CME - cystoid macular edema. PED - pigment epithelial detachment. IRF - intraretinal fluid. SRF - subretinal fluid. EZ - ellipsoid zone. ERM - epiretinal membrane. ORA - outer retinal atrophy. ORT - outer retinal tubulation. SRHM - subretinal hyper-reflective material. IRHM - intraretinal hyper-reflective material            ASSESSMENT/PLAN:    ICD-10-CM   1. Vitreous hemorrhage of left eye (HCC)  H43.12 OCT, Retina - OU - Both Eyes    2. Subhyaloid hemorrhage of left eye  H35.62 OCT, Retina - OU - Both Eyes    3. 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 Vitreous and subhyaloid hemorrhage / Terson's syndrome OS  - large central subhyaloid heme -- onset around 7/3  - history of Chiari malformation -- had VP shunt placed 5.27.23  - had syncopal episode on 7.3.23 and  came to w/ decreased central vision OS  - was followed by Dr. Lucianne Lei during hospitalization - VA CF 3' -- no improvement  - IOP OS 19 - discussed findings, prognosis, and treatment options - recommend 25g PPV to clear subhyaloid and vitreous heme OS - pt wishes to proceed with surgery - RBA of procedure discussed, questions answered - informed consent obtained and signed - case scheduled for 08/17/22 -- [facility] OR - f/u POV1 on 09.15.23   Ophthalmic Meds Ordered this visit:  No orders of the defined types were placed in this encounter.    Return in about 1 week (around 08/18/2022) for POV1 s/p PPV for VH OS.  There are no Patient Instructions on file for this visit.  Explained the diagnoses, plan, and follow up with the patient and they expressed understanding.  Patient expressed understanding of the importance of proper follow up care.   This document serves as a record of services personally performed by Gardiner Sleeper, MD, PhD. It was created on their behalf by San Jetty. Owens Shark, OA an ophthalmic technician. The creation of this record is the provider's dictation and/or activities during the visit.    Electronically signed by: San Jetty. Owens Shark, [place] 09.07.2023 11:58 PM  Gardiner Sleeper, M.D., Ph.D. Diseases & Surgery of the Retina and Vitreous [facility]  I have reviewed the above documentation for accuracy and completeness, and I agree with the above. Gardiner Sleeper, M.D., Ph.D. 08/11/22 11:58 PM  Abbreviations: M myopia (nearsighted); A astigmatism; H hyperopia (farsighted); P presbyopia; Mrx spectacle prescription;  CTL contact lenses; OD right eye; OS left eye; OU both eyes  XT exotropia; ET esotropia; PEK punctate epithelial keratitis; PEE punctate epithelial erosions; DES dry eye syndrome; MGD meibomian gland dysfunction; ATs artificial tears;  PFAT's preservative free artificial tears; [place] nuclear sclerotic cataract; PSC posterior subcapsular cataract; ERM  epi-retinal membrane; PVD posterior vitreous detachment; RD retinal detachment; DM diabetes mellitus; DR diabetic retinopathy; NPDR non-proliferative diabetic retinopathy; PDR proliferative diabetic retinopathy; CSME clinically significant macular edema; DME diabetic macular edema; dbh dot blot hemorrhages; CWS cotton wool spot; POAG primary open angle glaucoma; C/D cup-to-disc ratio; HVF humphrey visual field; GVF goldmann visual field; OCT optical coherence tomography; IOP intraocular pressure; BRVO Branch retinal vein occlusion; CRVO central retinal vein occlusion; CRAO central retinal artery occlusion; BRAO branch retinal artery occlusion; RT retinal tear; SB scleral buckle; PPV pars plana vitrectomy; VH Vitreous hemorrhage; PRP panretinal laser photocoagulation; IVK intravitreal kenalog; VMT vitreomacular traction; MH Macular hole;  NVD neovascularization of the disc; NVE neovascularization elsewhere; AREDS age related eye disease study; ARMD age related macular degeneration; POAG primary open angle glaucoma; EBMD epithelial/anterior basement membrane dystrophy; ACIOL anterior chamber intraocular lens; IOL intraocular lens; PCIOL posterior chamber intraocular lens; Phaco/IOL phacoemulsification with intraocular lens placement; [place] photorefractive keratectomy; LASIK laser assisted in situ keratomileusis; HTN hypertension; DM diabetes mellitus; COPD chronic obstructive pulmonary disease

## 2022-08-11 ENCOUNTER — Encounter (INDEPENDENT_AMBULATORY_CARE_PROVIDER_SITE_OTHER): Payer: Self-pay | Admitting: Ophthalmology

## 2022-08-11 ENCOUNTER — Ambulatory Visit (INDEPENDENT_AMBULATORY_CARE_PROVIDER_SITE_OTHER): Payer: 59 | Admitting: Ophthalmology

## 2022-08-11 DIAGNOSIS — H3562 Retinal hemorrhage, left eye: Secondary | ICD-10-CM | POA: Diagnosis not present

## 2022-08-11 DIAGNOSIS — I608 Other nontraumatic subarachnoid hemorrhage: Secondary | ICD-10-CM | POA: Diagnosis not present

## 2022-08-11 DIAGNOSIS — H4312 Vitreous hemorrhage, left eye: Secondary | ICD-10-CM

## 2022-08-12 NOTE — H&P (Signed)
Sabrina Diaz is an 26 y.o. female.    Chief Complaint: nonclearing vitreous and subhyaloid hemorrhage secondary to Terson Syndrome  HPI: 26 yo F w/ history of Chiari malformation s/p VP shunt placement who had acute vision loss OS following syncopal episode on 07.03.23. On dilated exam was found to a large central subhyaloid and vitreous hemorrhage. Hemorrhage has failed to clear and the persistent vision loss has affected her activities of daily living. After a discussion of the risks, benefits and alternatives to surgery, the patient has elected to proceed with surgery to clear the hemorrhages -- 25g PPV under general anesthesia, OS, under general anesthesia.  Past Medical History:  Diagnosis Date   Anxiety    Chiari malformation type I (HCC)    Chronic headaches     Past Surgical History:  Procedure Laterality Date   APPLICATION OF CRANIAL NAVIGATION  06/13/2022   Procedure: APPLICATION OF CRANIAL NAVIGATION;  Surgeon: Jadene Pierini, MD;  [facility];  Service: Neurosurgery;;   SHUNT REMOVAL N/A 06/13/2022   Procedure: Lumboperitoneal Shunt Removal;  Surgeon: Jadene Pierini, MD;  Location[facility];  Service: Neurosurgery;  Laterality: N/A;   SUBOCCIPITAL CRANIECTOMY CERVICAL LAMINECTOMY N/A 04/03/2022   Procedure: Chiari decompression;  Surgeon: Jadene Pierini, MD;  [facility] OR;  Service: Neurosurgery;  Laterality: N/A;  RM 21   VENTRICULOPERITONEAL SHUNT N/A 04/29/2022   Procedure: LUMBOPERITONEAL SHUNT PLACEMENT;  Surgeon: Jadene Pierini, MD;  Location: [facility];  Service: Neurosurgery;  Laterality: N/A;   VENTRICULOPERITONEAL SHUNT Right 06/13/2022   Procedure: Right Ventriculoperitoneal Shunt Placement;  Surgeon: Jadene Pierini, MD;  Location: [facility];  Service: Neurosurgery;  Laterality: Right;    No family history on file. Social History:  reports that she quit smoking about 13 months ago. Her smoking use included cigarettes. She has never used smokeless  tobacco. She reports current alcohol use. She reports that she does not currently use drugs after having used the following drugs: Marijuana.  Allergies: No Known Allergies  No medications prior to admission.    Review of systems otherwise negative  There were no vitals taken for this visit.  Physical exam: Mental status: oriented x3. Eyes: See eye exam associated with this date of surgery Ears, Nose, Throat: within normal limits Neck: Within Normal limits General: within normal limits Chest: Within normal limits Breast: deferred Heart: Within normal limits Abdomen: Within normal limits GU: deferred Extremities: within normal limits Skin: within normal limits  Assessment/Plan Nonclearing subhyaloid and vitreous hemorrhage, LEFT EYE  Plan: To [facility] for 25g PPV w/ possible laser, OS, under general anesthesia - case scheduled for Thursday, 09.14.23  Karie Chimera, M.D., Ph.D. Vitreoretinal Surgeon [facility]

## 2022-08-14 ENCOUNTER — Encounter (INDEPENDENT_AMBULATORY_CARE_PROVIDER_SITE_OTHER): Payer: 59 | Admitting: Ophthalmology

## 2022-08-15 ENCOUNTER — Telehealth: Payer: Self-pay

## 2022-08-15 NOTE — Telephone Encounter (Signed)
Received fax from [facility] for Surgical Clearance for blood thinners to be stopped 5 days prior to Retina Surgery. Placed on MD desk to be signed.

## 2022-08-15 NOTE — Telephone Encounter (Signed)
Surgical Clearance has been signed and faxed back. Per Dr. Delena Bali " No neurologic contraindications to surgery. Recommend neurosurgical clearance for VP shunt."

## 2022-08-15 NOTE — Progress Notes (Addendum)
[facility] - Clinic Note  08/18/2022     CHIEF COMPLAINT Patient presents for Retina Follow Up   HISTORY OF PRESENT ILLNESS: Sabrina Diaz is a 26 y.o. female who presents to the clinic today for:   HPI     Retina Follow Up   Patient presents with  Other.  In left eye.  Severity is moderate.  Duration of 1 day.  Since onset it is stable.  I, the attending physician,  performed the HPI with the patient and updated documentation appropriately.        Comments   Pt here for 1 day post op s/p PPV OS 08/17/22. Pt states she is feeling fine, some irritation in OS from stitches.       Last edited by Rennis Chris, MD on 08/18/2022  8:29 AM.    Pt states her vision has improved, she slept all night last night, she has some scratchiness from the stitches  Referring physician: Diona Foley, MD [address] [place],  [place] [address]  HISTORICAL INFORMATION:   Selected notes from the MEDICAL RECORD NUMBER Referred by Dr. Zenaida Niece LEE:  Ocular Hx- Vitreous Hemorrhage OS PMH-    CURRENT MEDICATIONS: No current outpatient medications on file. (Ophthalmic Drugs)   No current facility-administered medications for this visit. (Ophthalmic Drugs)   Current Outpatient Medications (Other)  Medication Sig   Calcium Carbonate Antacid (TUMS PO) Take 2 tablets by mouth 2 (two) times daily as needed (acid reflux/heartburn).   cyclobenzaprine (FLEXERIL) 10 MG tablet Take 10 mg by mouth 3 (three) times daily as needed for muscle spasms.   ondansetron (ZOFRAN-ODT) 4 MG disintegrating tablet Take 1 tablet (4 mg total) by mouth every 12 (twelve) hours as needed for nausea. (Patient not taking: Reported on 08/11/2022)   oxyCODONE-acetaminophen (PERCOCET) 5-325 MG tablet Take 1 tablet by mouth every 4 (four) hours as needed (pain). (Patient not taking: Reported on 08/11/2022)   traZODone (DESYREL) 100 MG tablet Take 1 tablet (100 mg total) by mouth at bedtime. (Patient not taking:  Reported on 08/11/2022)   zonisamide (ZONEGRAN) 100 MG capsule Take 2 capsules (200 mg total) by mouth daily. Take 100 mg (1 pill) daily for two weeks, then increase to 200 mg (2 pills) daily. (Patient not taking: Reported on 08/18/2022)   No current facility-administered medications for this visit. (Other)   REVIEW OF SYSTEMS: ROS   Positive for: Neurological, Eyes Negative for: Constitutional, Gastrointestinal, Skin, Genitourinary, Musculoskeletal, HENT, Endocrine, Cardiovascular, Respiratory, Psychiatric, Allergic/Imm, Heme/Lymph Last edited by Thompson Grayer, COT on 08/18/2022  7:59 AM.     ALLERGIES No Known Allergies  PAST MEDICAL HISTORY Past Medical History:  Diagnosis Date   Anxiety    Chiari malformation type I (HCC)    Chronic headaches    Depression    Past Surgical History:  Procedure Laterality Date   APPLICATION OF CRANIAL NAVIGATION  06/13/2022   Procedure: APPLICATION OF CRANIAL NAVIGATION;  Surgeon: Jadene Pierini, MD;  [facility] OR;  Service: Neurosurgery;;   PARS PLANA VITRECTOMY Left 08/17/2022   Procedure: TWENTY-FIVE GAUGE PARS PLANA VITRECTOMY;  Surgeon: Rennis Chris, MD;  Location: [facility] OR;  Service: Ophthalmology;  Laterality: Left;   SHUNT REMOVAL N/A 06/13/2022   Procedure: Lumboperitoneal Shunt Removal;  Surgeon: Jadene Pierini, MD;  [facility] OR;  Service: Neurosurgery;  Laterality: N/A;   SUBOCCIPITAL CRANIECTOMY CERVICAL LAMINECTOMY N/A 04/03/2022   Procedure: Chiari decompression;  Surgeon: Jadene Pierini, MD;  [facility] OR;  Service: Neurosurgery;  Laterality: N/A;  RM 21   VENTRICULOPERITONEAL SHUNT N/A 04/29/2022   Procedure: LUMBOPERITONEAL SHUNT PLACEMENT;  Surgeon: Jadene Pierini, MD;  [facility] OR;  Service: Neurosurgery;  Laterality: N/A;   VENTRICULOPERITONEAL SHUNT Right 06/13/2022   Procedure: Right Ventriculoperitoneal Shunt Placement;  Surgeon: Jadene Pierini, MD;  Location: [facility] OR;  Service:  Neurosurgery;  Laterality: Right;   FAMILY HISTORY History reviewed. No pertinent family history.  SOCIAL HISTORY Social History   Tobacco Use   Smoking status: Former    Types: Cigarettes    Quit date: 07/2021    Years since quitting: 1.1   Smokeless tobacco: Never  Vaping Use   Vaping Use: Some days  Substance Use Topics   Alcohol use: Yes    Comment: socially   Drug use: Not Currently    Types: Marijuana    Comment: Quit in 12/2021       OPHTHALMIC EXAM:  Base Eye Exam     Visual Acuity (Snellen - Linear)       Right Left   Dist [place] 20/50 -2 20/60 +1   Dist ph [place] 20/25 20/40 -1         Tonometry (Tonopen, 8:09 AM)       Right Left   Pressure 18 10         Pupils       Dark Light Shape React APD   Right 4 3 Round Minimal None   Left Dilated             Visual Fields       Left Right    Full Full         Extraocular Movement       Right Left    Full, Ortho Full, Ortho         Neuro/Psych     Oriented x3: Yes   Mood/Affect: Normal         Dilation     Left eye: 1.0% Mydriacyl, 2.5% Phenylephrine @ 8:10 AM           Slit Lamp and Fundus Exam     External Exam       Right Left   External Normal Normal         Slit Lamp Exam       Right Left   Lids/Lashes Mild MGD Normal   Conjunctiva/Sclera White and quiet Mild Subconjunctival hemorrhage, sutures intact   Cornea Debris in tear film trace PEE, mild tear film debris   Anterior Chamber Deep and quiet Deep, 2+ fine cell / pigment   Iris Positive PPM, Round and dilated Round and dilated   Lens Clear Clear   Anterior Vitreous Normal post vitrectomy, trace pigment and RBC's, diffuse VH cleared         Fundus Exam       Right Left   Disc Pink and sharp, Compact Pink and sharp   C/D Ratio 0.1 0.2   Macula Flat, Good foveal reflex, No heme, No edema Flat, good foveal reflex, subhyaloid heme gone, mild pigment changes / demarcation line temporal macula and along  arcades   Vessels Normal attenuated, mild tortuosity   Periphery Attached Attached, no heme           IMAGING AND PROCEDURES  Imaging and Procedures for 08/18/2022  OCT, Retina - OU - Both Eyes       Right Eye Quality was good. Central Foveal Thickness: 274.  Progression has been stable. Findings include normal foveal contour, no IRF, no SRF, vitreomacular adhesion .   Left Eye Quality was good. Central Foveal Thickness: 348. Progression has improved. Findings include no IRF, no SRF, abnormal foveal contour (large subhyaloid heme gone; mild vit opacities).   Notes *Images captured and stored on drive  Diagnosis / Impression:  OD: NFP; no IRF/SRF  OS: large subhyaloid heme gone; mild vit opacities -- VH improved  Clinical management:  See below  Abbreviations: NFP - Normal foveal profile. CME - cystoid macular edema. PED - pigment epithelial detachment. IRF - intraretinal fluid. SRF - subretinal fluid. EZ - ellipsoid zone. ERM - epiretinal membrane. ORA - outer retinal atrophy. ORT - outer retinal tubulation. SRHM - subretinal hyper-reflective material. IRHM - intraretinal hyper-reflective material            ASSESSMENT/PLAN:    ICD-10-CM   1. Vitreous hemorrhage of left eye (HCC)  H43.12 OCT, Retina - OU - Both Eyes    2. 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 Vitreous and subhyaloid hemorrhage / Terson's syndrome OS  - history of Chiari malformation -- had VP shunt placed 5.27.23  - had syncopal episode on 7.3.23 and came to w/ decreased central vision OS -- large central subhyaloid heme  - was followed by Dr. Zenaida Niece during hospitalization - pre op BCVA CF - now POD1 s/p PPV/EL/FAX OS, 09.14.2021             - doing well this morning -- subhyaloid and vit hemes cleared - BCVA OS 20/40 from CF - retina attached and in good position             - IOP good at 10             - start   PF 4x/day  OS                          zymaxid QID OS                          Atropine BID OS                          PSO ung QID OS              - eye shield when sleeping              - post op drop and positioning instructions reviewed              - tylenol/ibuprofen for pain   - f/u Thursday, POV -- DFE, OCT  Ophthalmic Meds Ordered this visit:  No orders of the defined types were placed in this encounter.    Return in about 6 days (around 08/24/2022) for f/u VH OS, DFE, OCT.  There are no Patient Instructions on file for this visit.  Explained the diagnoses, plan, and follow up with the patient and they expressed understanding.  Patient expressed understanding of the importance of proper follow up care.   This document serves as a record of services personally performed by Karie Chimera, MD, PhD. It was created on their behalf by Glee Arvin. Manson Passey, OA an ophthalmic technician. The creation of this record is the provider's dictation  and/or activities during the visit.    Electronically signed by: Glee Arvin. Kristopher Oppenheim 09.12.2023 12:39 PM  Karie Chimera, M.D., Ph.D. Diseases & Surgery of the Retina and Vitreous [facility]  I have reviewed the above documentation for accuracy and completeness, and I agree with the above. Karie Chimera, M.D., Ph.D. 08/18/22 12:39 PM   Abbreviations: M myopia (nearsighted); A astigmatism; H hyperopia (farsighted); P presbyopia; Mrx spectacle prescription;  CTL contact lenses; OD right eye; OS left eye; OU both eyes  XT exotropia; ET esotropia; PEK punctate epithelial keratitis; PEE punctate epithelial erosions; DES dry eye syndrome; MGD meibomian gland dysfunction; ATs artificial tears; PFAT's preservative free artificial tears; NSC nuclear sclerotic cataract; PSC posterior subcapsular cataract; ERM epi-retinal membrane; PVD posterior vitreous detachment; RD retinal detachment; DM diabetes mellitus; DR diabetic retinopathy; NPDR  non-proliferative diabetic retinopathy; PDR proliferative diabetic retinopathy; CSME clinically significant macular edema; DME diabetic macular edema; dbh dot blot hemorrhages; CWS cotton wool spot; POAG primary open angle glaucoma; C/D cup-to-disc ratio; HVF humphrey visual field; GVF goldmann visual field; OCT optical coherence tomography; IOP intraocular pressure; BRVO Branch retinal vein occlusion; CRVO central retinal vein occlusion; CRAO central retinal artery occlusion; BRAO branch retinal artery occlusion; RT retinal tear; SB scleral buckle; PPV pars plana vitrectomy; VH Vitreous hemorrhage; PRP panretinal laser photocoagulation; IVK intravitreal kenalog; VMT vitreomacular traction; MH Macular hole;  NVD neovascularization of the disc; NVE neovascularization elsewhere; AREDS age related eye disease study; ARMD age related macular degeneration; POAG primary open angle glaucoma; EBMD epithelial/anterior basement membrane dystrophy; ACIOL anterior chamber intraocular lens; IOL intraocular lens; PCIOL posterior chamber intraocular lens; Phaco/IOL phacoemulsification with intraocular lens placement; PRK photorefractive keratectomy; LASIK laser assisted in situ keratomileusis; HTN hypertension; DM diabetes mellitus; COPD chronic obstructive pulmonary disease

## 2022-08-16 ENCOUNTER — Encounter (HOSPITAL_COMMUNITY): Payer: Self-pay | Admitting: Ophthalmology

## 2022-08-16 NOTE — Anesthesia Preprocedure Evaluation (Addendum)
Anesthesia Evaluation  Patient identified by MRN, date of birth, ID band Patient awake    Reviewed: Allergy & Precautions, NPO status , Patient's Chart, lab work & pertinent test results  Airway Mallampati: II  TM Distance: >3 FB Neck ROM: Full    Dental  (+) Dental Advisory Given, Teeth Intact, Chipped,    Pulmonary neg pulmonary ROS, Patient abstained from smoking., former smoker,    Pulmonary exam normal breath sounds clear to auscultation       Cardiovascular negative cardio ROS Normal cardiovascular exam Rhythm:Regular Rate:Normal     Neuro/Psych  Headaches, PSYCHIATRIC DISORDERS Anxiety Depression    GI/Hepatic negative GI ROS, Neg liver ROS,   Endo/Other  negative endocrine ROS  Renal/GU negative Renal ROS     Musculoskeletal negative musculoskeletal ROS (+)   Abdominal   Peds  Hematology negative hematology ROS (+)   Anesthesia Other Findings   Reproductive/Obstetrics negative OB ROS                                                            Anesthesia Evaluation  Patient identified by MRN, date of birth, ID band Patient awake    Reviewed: Allergy & Precautions, NPO status , Patient's Chart, lab work & pertinent test results  Airway Mallampati: II  TM Distance: >3 FB Neck ROM: Full    Dental  (+) Dental Advisory Given, Chipped,    Pulmonary neg pulmonary ROS, Patient abstained from smoking., former smoker,    Pulmonary exam normal breath sounds clear to auscultation       Cardiovascular negative cardio ROS Normal cardiovascular exam Rhythm:Regular Rate:Normal     Neuro/Psych  Headaches, Chiari malformation s/p decompression followed by LP shunt 04/2022 negative psych ROS   GI/Hepatic negative GI ROS, Neg liver ROS,   Endo/Other  negative endocrine ROS  Renal/GU negative Renal ROS  negative genitourinary   Musculoskeletal negative musculoskeletal  ROS (+)   Abdominal   Peds  Hematology negative hematology ROS (+)   Anesthesia Other Findings 26 y.o. female with chiari malformation s/p decompression with post-op hydrocephalus s/p LP shunt placement, complex course since then with severe apneic event. Now presents for VP shunt.   Reproductive/Obstetrics                            Anesthesia Physical Anesthesia Plan  ASA: 2  Anesthesia Plan: General   Post-op Pain Management: Tylenol PO (pre-op)*   Induction: Intravenous  PONV Risk Score and Plan: 3 and Midazolam, Dexamethasone and Ondansetron  Airway Management Planned: Oral ETT  Additional Equipment: Arterial line  Intra-op Plan:   Post-operative Plan: Extubation in OR  Informed Consent: I have reviewed the patients History and Physical, chart, labs and discussed the procedure including the risks, benefits and alternatives for the proposed anesthesia with the patient or authorized representative who has indicated his/her understanding and acceptance.     Dental advisory given  Plan Discussed with: CRNA  Anesthesia Plan Comments:         Anesthesia Quick Evaluation  Anesthesia Physical Anesthesia Plan  ASA: 3  Anesthesia Plan: General   Post-op Pain Management: Tylenol PO (pre-op)*   Induction: Intravenous  PONV Risk Score and Plan: 3 and Ondansetron, Dexamethasone, Treatment may vary due to  age or medical condition, Propofol infusion and Midazolam  Airway Management Planned: Oral ETT  Additional Equipment:   Intra-op Plan:   Post-operative Plan: Extubation in OR  Informed Consent: I have reviewed the patients History and Physical, chart, labs and discussed the procedure including the risks, benefits and alternatives for the proposed anesthesia with the patient or authorized representative who has indicated his/her understanding and acceptance.     Dental advisory given  Plan Discussed with: CRNA  Anesthesia  Plan Comments: (PAT note written 08/16/2022 by Shonna Chock, PA-C. )     Anesthesia Quick Evaluation

## 2022-08-16 NOTE — Progress Notes (Signed)
Anesthesia Chart Review: Sabrina Diaz   Case: [id number] Date/Time: 08/17/22 1115   Procedure: PARS PLANA VITRECTOMY WITH 25 GAUGE (Left)   Anesthesia type: General   Pre-op diagnosis: vitreous hemorrhage left eye   [facility] ROOM 08 / [facility]   Surgeons: Rennis Chris, MD       DISCUSSION: Patient is a 26 year old female scheduled for the above procedure.  History includes former smoker (quit 07/04/21), Chiari malformation type 1 (s/p suboccipital decompression of chiari malformation 04/03/22; LP shunt 04/29/22 for hydrocephalus; re-admitted 06/05/22 from [facility] after found decreased LOC at home->vitreous hemorrhage, moderate contralateral papilledema-->left SDH placement of right frontal VP shunt 06/13/22), headaches, anxiety, depression.  PCP Nino Glow, PA-C advised neurology clearance for procedure. Per Wynonia Musty, CMA, "Per Dr. Delena Bali [neurologist] 'No neurologic contraindications to surgery. Recommend neurosurgical clearance for VP shunt.'"  Dr. Laban Emperor office has reached out to her neurosurgeon Dr. Maurice Small, and waiting on a fax response, but patient had reported she had discussed this with him at her recent 08/04/22 visit. Per 08/04/22 neurosurgery notation in Care Everywhere, "[name] is here for follow up. Overall, doing well. Having some problems with acute on chronic insomnia, neurology gave her trazoddone which didn&#39;t help. Having some tednerness around her abdominal incision, no change with BM / meals / no change in bowel habits, no N/V or severe pain, just tender around the abdominal incision. This is slowly improveing each week thankfully, none of her shunt-failure type symptoms have been present thankfully." Marchelle Folks at Dr. Laban Emperor office to fax over clearance(s) once received.  Anesthesia team to evaluate on the day of surgery. Labs available are > 30 days old.    VS: Ht 5\' 6"  (1.676 m)   Wt 94 kg   LMP 07/21/2022 (Exact Date)   BMI 33.47  kg/m  BP Readings from Last 3 Encounters:  06/16/22 125/71  05/06/22 116/83  05/01/22 121/78   Pulse Readings from Last 3 Encounters:  06/16/22 78  05/06/22 (!) 59  05/01/22 98     PROVIDERS: 05/03/22, PA-C is PCP  Nino Glow, MD is neurologist Ocie Doyne, MD is neurosurgeon   LABS: For day of surgery as indicated. Last results include: Lab Results  Component Value Date   WBC 24.0 (H) 06/14/2022   HGB 12.4 06/14/2022   HCT 39.2 06/14/2022   PLT 286 06/14/2022   GLUCOSE 121 (H) 06/14/2022   ALT 31 05/03/2022   AST 14 (L) 05/03/2022   NA 138 06/14/2022   K 3.5 06/14/2022   CL 109 06/14/2022   CREATININE 0.87 06/14/2022   BUN 17 06/14/2022   CO2 19 (L) 06/14/2022    EEG 06/07/22: IMPRESSION: This study is suggestive of moderate diffuse encephalopathy, nonspecific etiology. No seizures or epileptiform discharges were seen throughout the recording.  IMAGES: CT Head 06/14/22: IMPRESSION: Expected postoperative changes of shunt catheter placement. Ventricles are now slit-like in caliber. Persistent effacement of basal cisterns. There are new subdural hygromas in the posterior fossa.   Chiari 1 malformation post suboccipital decompression. Similar crowding at the foramen magnum.   Possible low-density within the cord at the cervicomedullary junction in area of potential artifact (series 3, image 2).   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    EKG: 06/09/22: Normal sinus rhythm Prolonged QT (QT 448, QTcB 513 ms) Abnormal ECG No previous ECGs available Confirmed by 08/10/22 (Donato Schultz) on 06/10/2022 5:58:37 PM   CV: BLE Venous 08/11/2022 06/14/22: Summary:  RIGHT:  - There is no evidence of deep vein thrombosis in the lower extremity.  - No cystic structure found in the popliteal fossa.     LEFT:  - There is no evidence of deep vein thrombosis in the lower extremity.  - No cystic structure found in  the popliteal fossa.      Past Medical History:  Diagnosis Date   Anxiety    Chiari malformation type I (HCC)    Chronic headaches    Depression     Past Surgical History:  Procedure Laterality Date   APPLICATION OF CRANIAL NAVIGATION  06/13/2022   Procedure: APPLICATION OF CRANIAL NAVIGATION;  Surgeon: Jadene Pierini, MD;  [facility];  Service: Neurosurgery;;   SHUNT REMOVAL N/A 06/13/2022   Procedure: Lumboperitoneal Shunt Removal;  Surgeon: Jadene Pierini, MD;  [facility];  Service: Neurosurgery;  Laterality: N/A;   SUBOCCIPITAL CRANIECTOMY CERVICAL LAMINECTOMY N/A 04/03/2022   Procedure: Chiari decompression;  Surgeon: Jadene Pierini, MD;  [facility];  Service: Neurosurgery;  Laterality: N/A;  RM 21   VENTRICULOPERITONEAL SHUNT N/A 04/29/2022   Procedure: LUMBOPERITONEAL SHUNT PLACEMENT;  Surgeon: Jadene Pierini, MD;  [facility];  Service: Neurosurgery;  Laterality: N/A;   VENTRICULOPERITONEAL SHUNT Right 06/13/2022   Procedure: Right Ventriculoperitoneal Shunt Placement;  Surgeon: Jadene Pierini, MD;  Location: [facility] OR;  Service: Neurosurgery;  Laterality: Right;    MEDICATIONS: No current facility-administered medications for this encounter.    Calcium Carbonate Antacid (TUMS PO)   cyclobenzaprine (FLEXERIL) 10 MG tablet   zonisamide (ZONEGRAN) 100 MG capsule   ondansetron (ZOFRAN-ODT) 4 MG disintegrating tablet   oxyCODONE-acetaminophen (PERCOCET) 5-325 MG tablet   traZODone (DESYREL) 100 MG tablet    Shonna Chock, PA-C Surgical Short Stay/Anesthesiology [facility] Phone [phone] [facility] Phone [phone] 08/16/2022 3:03 PM

## 2022-08-16 NOTE — Progress Notes (Signed)
PCP - Haywood Lasso, PA-C Cardiologist - n/a Neurology - Dr Ocie Doyne  Chest x-ray - 06/14/22, 06/09/22 (2V) EKG - 77/7/23 Stress Test - n/a ECHO - n/a Cardiac Cath - n/a  ICD Pacemaker/Loop - n/a  Sleep Study -  n/a CPAP - none  ERAS: Clear liquids til 8:30 AM DOS.  STOP now taking any Aspirin (unless otherwise instructed by your surgeon), Aleve, Naproxen, Ibuprofen, Motrin, Advil, Goody's, BC's, all herbal medications, fish oil, and all vitamins.   Coronavirus Screening Do you have any of the following symptoms:  Cough yes/no: No Fever (>100.55F)  yes/no: No Runny nose yes/no: No Sore throat yes/no: No Difficulty breathing/shortness of breath  yes/no: No  Have you traveled in the last 14 days and where? yes/no: No  Patient verbalized understanding of instructions that were given via phone.

## 2022-08-17 ENCOUNTER — Encounter (HOSPITAL_COMMUNITY): Payer: Self-pay | Admitting: Ophthalmology

## 2022-08-17 ENCOUNTER — Encounter (HOSPITAL_COMMUNITY)
Admission: RE | Disposition: A | Discharge status: Home / Self Care | Payer: Self-pay | Source: Home / Self Care | Attending: Ophthalmology

## 2022-08-17 ENCOUNTER — Ambulatory Visit (HOSPITAL_BASED_OUTPATIENT_CLINIC_OR_DEPARTMENT_OTHER): Payer: 59 | Admitting: Physician Assistant

## 2022-08-17 ENCOUNTER — Other Ambulatory Visit: Payer: Self-pay

## 2022-08-17 ENCOUNTER — Ambulatory Visit (HOSPITAL_COMMUNITY)
Admission: RE | Admit: 2022-08-17 | Discharge: 2022-08-17 | Disposition: A | Discharge status: Home / Self Care | Payer: 59 | Attending: Ophthalmology | Admitting: Ophthalmology

## 2022-08-17 ENCOUNTER — Ambulatory Visit (HOSPITAL_COMMUNITY): Payer: 59 | Admitting: Physician Assistant

## 2022-08-17 DIAGNOSIS — H3562 Retinal hemorrhage, left eye: Secondary | ICD-10-CM

## 2022-08-17 DIAGNOSIS — F418 Other specified anxiety disorders: Secondary | ICD-10-CM

## 2022-08-17 DIAGNOSIS — Z982 Presence of cerebrospinal fluid drainage device: Secondary | ICD-10-CM | POA: Diagnosis not present

## 2022-08-17 DIAGNOSIS — H4312 Vitreous hemorrhage, left eye: Secondary | ICD-10-CM

## 2022-08-17 DIAGNOSIS — Z87891 Personal history of nicotine dependence: Secondary | ICD-10-CM | POA: Diagnosis not present

## 2022-08-17 HISTORY — DX: Depression, unspecified: F32.A

## 2022-08-17 HISTORY — PX: PARS PLANA VITRECTOMY: SHX2166

## 2022-08-17 LAB — CBC
HCT: 37.8 % (ref 36.0–46.0)
Hemoglobin: 12.7 g/dL (ref 12.0–15.0)
MCH: 27.7 pg (ref 26.0–34.0)
MCHC: 33.6 g/dL (ref 30.0–36.0)
MCV: 82.5 fL (ref 80.0–100.0)
Platelets: 244 10*3/uL (ref 150–400)
RBC: 4.58 MIL/uL (ref 3.87–5.11)
RDW: 12.9 % (ref 11.5–15.5)
WBC: 9.7 10*3/uL (ref 4.0–10.5)
nRBC: 0 % (ref 0.0–0.2)

## 2022-08-17 LAB — POCT PREGNANCY, URINE: Preg Test, Ur: NEGATIVE

## 2022-08-17 SURGERY — PARS PLANA VITRECTOMY WITH 25 GAUGE
Anesthesia: General | Site: Eye | Laterality: Left

## 2022-08-17 MED ORDER — PREDNISOLONE ACETATE 1 % OP SUSP
OPHTHALMIC | Status: DC | PRN
  Administered 2022-08-17: 1 [drp] via OPHTHALMIC

## 2022-08-17 MED ORDER — MIDAZOLAM HCL 2 MG/2ML IJ SOLN
INTRAMUSCULAR | Status: DC | PRN
  Administered 2022-08-17: 2 mg via INTRAVENOUS

## 2022-08-17 MED ORDER — DORZOLAMIDE HCL-TIMOLOL MAL 2-0.5 % OP SOLN
OPHTHALMIC | Status: AC
  Filled 2022-08-17: qty 10

## 2022-08-17 MED ORDER — MIDAZOLAM HCL 2 MG/2ML IJ SOLN
INTRAMUSCULAR | Status: AC
  Filled 2022-08-17: qty 2

## 2022-08-17 MED ORDER — GATIFLOXACIN 0.5 % OP SOLN
OPHTHALMIC | Status: AC
  Filled 2022-08-17: qty 2.5

## 2022-08-17 MED ORDER — DEXAMETHASONE SODIUM PHOSPHATE 10 MG/ML IJ SOLN
INTRAMUSCULAR | Status: AC
  Filled 2022-08-17: qty 1

## 2022-08-17 MED ORDER — FENTANYL CITRATE (PF) 100 MCG/2ML IJ SOLN
25.0000 ug | INTRAMUSCULAR | Status: DC | PRN
  Administered 2022-08-17: 50 ug via INTRAVENOUS

## 2022-08-17 MED ORDER — CHLORHEXIDINE GLUCONATE 0.12 % MT SOLN
OROMUCOSAL | Status: AC
  Administered 2022-08-17: 15 mL via OROMUCOSAL
  Filled 2022-08-17: qty 15

## 2022-08-17 MED ORDER — ATROPINE SULFATE 1 % OP SOLN
OPHTHALMIC | Status: DC | PRN
  Administered 2022-08-17: 1 [drp] via OPHTHALMIC

## 2022-08-17 MED ORDER — DORZOLAMIDE HCL-TIMOLOL MAL 2-0.5 % OP SOLN
OPHTHALMIC | Status: DC | PRN
  Administered 2022-08-17: 1 [drp] via OPHTHALMIC

## 2022-08-17 MED ORDER — BACITRACIN-POLYMYXIN B 500-10000 UNIT/GM OP OINT
TOPICAL_OINTMENT | OPHTHALMIC | Status: AC
  Filled 2022-08-17: qty 3.5

## 2022-08-17 MED ORDER — TOBRAMYCIN-DEXAMETHASONE 0.3-0.1 % OP OINT
TOPICAL_OINTMENT | OPHTHALMIC | Status: AC
  Filled 2022-08-17: qty 3.5

## 2022-08-17 MED ORDER — PHENYLEPHRINE HCL 10 % OP SOLN
1.0000 [drp] | OPHTHALMIC | Status: AC | PRN
  Administered 2022-08-17 (×3): 1 [drp] via OPHTHALMIC
  Filled 2022-08-17: qty 5

## 2022-08-17 MED ORDER — FENTANYL CITRATE (PF) 250 MCG/5ML IJ SOLN
INTRAMUSCULAR | Status: DC | PRN
  Administered 2022-08-17: 100 ug via INTRAVENOUS
  Administered 2022-08-17 (×3): 50 ug via INTRAVENOUS

## 2022-08-17 MED ORDER — POLYMYXIN B SULFATE 500000 UNITS IJ SOLR
INTRAMUSCULAR | Status: AC
  Filled 2022-08-17: qty 10

## 2022-08-17 MED ORDER — EPINEPHRINE PF 1 MG/ML IJ SOLN
INTRAMUSCULAR | Status: AC
  Filled 2022-08-17: qty 1

## 2022-08-17 MED ORDER — ONDANSETRON HCL 4 MG/2ML IJ SOLN
INTRAMUSCULAR | Status: AC
  Filled 2022-08-17: qty 2

## 2022-08-17 MED ORDER — TROPICAMIDE 1 % OP SOLN
1.0000 [drp] | OPHTHALMIC | Status: AC | PRN
  Administered 2022-08-17 (×3): 1 [drp] via OPHTHALMIC
  Filled 2022-08-17: qty 15

## 2022-08-17 MED ORDER — BUPIVACAINE HCL (PF) 0.75 % IJ SOLN
INTRAMUSCULAR | Status: AC
  Filled 2022-08-17: qty 10

## 2022-08-17 MED ORDER — SODIUM CHLORIDE (PF) 0.9 % IJ SOLN
INTRAMUSCULAR | Status: AC
  Filled 2022-08-17: qty 10

## 2022-08-17 MED ORDER — GATIFLOXACIN 0.5 % OP SOLN OPTIME - NO CHARGE
OPHTHALMIC | Status: DC | PRN
  Administered 2022-08-17: 1 [drp] via OPHTHALMIC

## 2022-08-17 MED ORDER — PROMETHAZINE HCL 25 MG/ML IJ SOLN: 6.2500 mg | INTRAMUSCULAR | Status: DC | PRN

## 2022-08-17 MED ORDER — ONDANSETRON HCL 4 MG/2ML IJ SOLN
INTRAMUSCULAR | Status: DC | PRN
  Administered 2022-08-17: 4 mg via INTRAVENOUS

## 2022-08-17 MED ORDER — NA CHONDROIT SULF-NA HYALURON 40-30 MG/ML IO SOSY
INTRAOCULAR | Status: DC | PRN
  Administered 2022-08-17: 0.5 mL via INTRAOCULAR

## 2022-08-17 MED ORDER — PROPOFOL 500 MG/50ML IV EMUL
INTRAVENOUS | Status: DC | PRN
  Administered 2022-08-17: 25 ug/kg/min via INTRAVENOUS

## 2022-08-17 MED ORDER — SODIUM CHLORIDE 0.9 % IV SOLN: INTRAVENOUS | Status: DC

## 2022-08-17 MED ORDER — AMISULPRIDE (ANTIEMETIC) 5 MG/2ML IV SOLN: 10.0000 mg | Freq: Once | INTRAVENOUS | Status: DC | PRN

## 2022-08-17 MED ORDER — LIDOCAINE 2% (20 MG/ML) 5 ML SYRINGE
INTRAMUSCULAR | Status: AC
  Filled 2022-08-17: qty 5

## 2022-08-17 MED ORDER — CHLORHEXIDINE GLUCONATE 0.12 % MT SOLN: 15.0000 mL | Freq: Once | OROMUCOSAL | Status: AC

## 2022-08-17 MED ORDER — FENTANYL CITRATE (PF) 100 MCG/2ML IJ SOLN
INTRAMUSCULAR | Status: AC
  Filled 2022-08-17: qty 2

## 2022-08-17 MED ORDER — ROCURONIUM BROMIDE 10 MG/ML (PF) SYRINGE
PREFILLED_SYRINGE | INTRAVENOUS | Status: DC | PRN
  Administered 2022-08-17: 60 mg via INTRAVENOUS

## 2022-08-17 MED ORDER — NA CHONDROIT SULF-NA HYALURON 40-30 MG/ML IO SOSY
INTRAOCULAR | Status: AC
  Filled 2022-08-17: qty 1

## 2022-08-17 MED ORDER — OXYCODONE HCL 5 MG PO TABS
ORAL_TABLET | ORAL | Status: AC
  Filled 2022-08-17: qty 1

## 2022-08-17 MED ORDER — LIDOCAINE 2% (20 MG/ML) 5 ML SYRINGE
INTRAMUSCULAR | Status: DC | PRN
  Administered 2022-08-17: 100 mg via INTRAVENOUS

## 2022-08-17 MED ORDER — STERILE WATER FOR INJECTION IJ SOLN
INTRAMUSCULAR | Status: DC | PRN
  Administered 2022-08-17: .5 mL

## 2022-08-17 MED ORDER — TRIAMCINOLONE ACETONIDE 40 MG/ML IJ SUSP
INTRAMUSCULAR | Status: AC
  Filled 2022-08-17: qty 5

## 2022-08-17 MED ORDER — ACETAZOLAMIDE SODIUM 500 MG IJ SOLR
INTRAMUSCULAR | Status: AC
  Filled 2022-08-17: qty 500

## 2022-08-17 MED ORDER — TRIAMCINOLONE ACETONIDE 40 MG/ML IJ SUSP
INTRAMUSCULAR | Status: DC | PRN
  Administered 2022-08-17 (×2): .5 mL

## 2022-08-17 MED ORDER — PROPARACAINE HCL 0.5 % OP SOLN
1.0000 [drp] | OPHTHALMIC | Status: AC | PRN
  Administered 2022-08-17 (×3): 1 [drp] via OPHTHALMIC
  Filled 2022-08-17: qty 15

## 2022-08-17 MED ORDER — EPINEPHRINE PF 1 MG/ML IJ SOLN
INTRAOCULAR | Status: DC | PRN
  Administered 2022-08-17: 500.3 mL

## 2022-08-17 MED ORDER — FENTANYL CITRATE (PF) 250 MCG/5ML IJ SOLN
INTRAMUSCULAR | Status: AC
  Filled 2022-08-17: qty 5

## 2022-08-17 MED ORDER — OXYCODONE HCL 5 MG PO TABS
5.0000 mg | ORAL_TABLET | Freq: Once | ORAL | Status: AC
  Administered 2022-08-17: 5 mg via ORAL

## 2022-08-17 MED ORDER — BSS IO SOLN
INTRAOCULAR | Status: AC
  Filled 2022-08-17: qty 15

## 2022-08-17 MED ORDER — BRIMONIDINE TARTRATE 0.2 % OP SOLN
OPHTHALMIC | Status: AC
  Filled 2022-08-17: qty 5

## 2022-08-17 MED ORDER — ATROPINE SULFATE 1 % OP SOLN
OPHTHALMIC | Status: AC
  Filled 2022-08-17: qty 5

## 2022-08-17 MED ORDER — BSS PLUS IO SOLN
INTRAOCULAR | Status: AC
  Filled 2022-08-17: qty 500

## 2022-08-17 MED ORDER — PROPOFOL 10 MG/ML IV BOLUS
INTRAVENOUS | Status: DC | PRN
  Administered 2022-08-17: 200 mg via INTRAVENOUS

## 2022-08-17 MED ORDER — ORAL CARE MOUTH RINSE: 15.0000 mL | Freq: Once | OROMUCOSAL | Status: AC

## 2022-08-17 MED ORDER — CEFTAZIDIME 1 G IJ SOLR
INTRAMUSCULAR | Status: AC
  Filled 2022-08-17: qty 1

## 2022-08-17 MED ORDER — LIDOCAINE HCL (PF) 2 % IJ SOLN
INTRAMUSCULAR | Status: AC
  Filled 2022-08-17: qty 10

## 2022-08-17 MED ORDER — BACITRACIN-POLYMYXIN B 500-10000 UNIT/GM OP OINT
TOPICAL_OINTMENT | OPHTHALMIC | Status: DC | PRN
  Administered 2022-08-17: 1 via OPHTHALMIC

## 2022-08-17 MED ORDER — SUGAMMADEX SODIUM 200 MG/2ML IV SOLN
INTRAVENOUS | Status: DC | PRN
  Administered 2022-08-17: 200 mg via INTRAVENOUS

## 2022-08-17 MED ORDER — CARBACHOL 0.01 % IO SOLN
INTRAOCULAR | Status: AC
  Filled 2022-08-17: qty 1.5

## 2022-08-17 MED ORDER — MEPERIDINE HCL 25 MG/ML IJ SOLN: 6.2500 mg | INTRAMUSCULAR | Status: DC | PRN

## 2022-08-17 MED ORDER — DEXAMETHASONE SODIUM PHOSPHATE 10 MG/ML IJ SOLN
INTRAMUSCULAR | Status: DC | PRN
  Administered 2022-08-17: 10 mg via INTRAVENOUS

## 2022-08-17 MED ORDER — ROCURONIUM BROMIDE 10 MG/ML (PF) SYRINGE
PREFILLED_SYRINGE | INTRAVENOUS | Status: AC
  Filled 2022-08-17: qty 10

## 2022-08-17 MED ORDER — ACETAMINOPHEN 500 MG PO TABS
1000.0000 mg | ORAL_TABLET | Freq: Once | ORAL | Status: AC
  Administered 2022-08-17: 1000 mg via ORAL

## 2022-08-17 MED ORDER — STERILE WATER FOR INJECTION IJ SOLN
INTRAMUSCULAR | Status: AC
  Filled 2022-08-17: qty 10

## 2022-08-17 MED ORDER — BRIMONIDINE TARTRATE 0.2 % OP SOLN
OPHTHALMIC | Status: DC | PRN
  Administered 2022-08-17: 1 [drp] via OPHTHALMIC

## 2022-08-17 MED ORDER — ACETAMINOPHEN 500 MG PO TABS
ORAL_TABLET | ORAL | Status: AC
  Filled 2022-08-17: qty 2

## 2022-08-17 MED ORDER — PREDNISOLONE ACETATE 1 % OP SUSP
OPHTHALMIC | Status: AC
  Filled 2022-08-17: qty 5

## 2022-08-17 MED ORDER — ATROPINE SULFATE 1 % OP SOLN
1.0000 [drp] | OPHTHALMIC | Status: AC | PRN
  Administered 2022-08-17 (×3): 1 [drp] via OPHTHALMIC
  Filled 2022-08-17: qty 5

## 2022-08-17 SURGICAL SUPPLY — 37 items
BNDG EYE OVAL (GAUZE/BANDAGES/DRESSINGS) ×1 IMPLANT
CABLE BIPOLOR RESECTION CORD (MISCELLANEOUS) ×1 IMPLANT
CANNULA FLEX TIP 25G (CANNULA) ×1 IMPLANT
CLSR STERI-STRIP ANTIMIC 1/2X4 (GAUZE/BANDAGES/DRESSINGS) ×1 IMPLANT
DRAPE INCISE 51X51 W/FILM STRL (DRAPES) ×1 IMPLANT
DRAPE MICROSCOPE LEICA 46X105 (MISCELLANEOUS) ×1 IMPLANT
DRAPE OPHTHALMIC 77X100 STRL (CUSTOM PROCEDURE TRAY) ×1 IMPLANT
GLOVE BIO SURGEON STRL SZ7.5 (GLOVE) ×2 IMPLANT
GLOVE BIOGEL M 7.0 STRL (GLOVE) ×1 IMPLANT
GLOVE SURG MICRO LTX SZ7 (GLOVE) ×2 IMPLANT
GLOVE SURG MICRO LTX SZ7.5 (GLOVE) ×1 IMPLANT
GOWN STRL REUS W/ TWL LRG LVL3 (GOWN DISPOSABLE) ×2 IMPLANT
GOWN STRL REUS W/ TWL XL LVL3 (GOWN DISPOSABLE) ×1 IMPLANT
GOWN STRL REUS W/TWL LRG LVL3 (GOWN DISPOSABLE) ×2
GOWN STRL REUS W/TWL XL LVL3 (GOWN DISPOSABLE) ×1
KIT BASIN OR (CUSTOM PROCEDURE TRAY) ×1 IMPLANT
NDL 18GX1X1/2 (RX/OR ONLY) (NEEDLE) ×3 IMPLANT
NDL FILTER BLUNT 18X1 1/2 (NEEDLE) ×1 IMPLANT
NDL PRECISIONGLIDE 27X1.5 (NEEDLE) IMPLANT
NEEDLE 18GX1X1/2 (RX/OR ONLY) (NEEDLE) ×3 IMPLANT
NEEDLE FILTER BLUNT 18X1 1/2 (NEEDLE) ×1 IMPLANT
NEEDLE PRECISIONGLIDE 27X1.5 (NEEDLE) IMPLANT
NS IRRIG 1000ML POUR BTL (IV SOLUTION) ×1 IMPLANT
PACK VITRECTOMY CUSTOM (CUSTOM PROCEDURE TRAY) ×1 IMPLANT
PAD ARMBOARD 7.5X6 YLW CONV (MISCELLANEOUS) ×2 IMPLANT
PAK PIK VITRECTOMY CVS 25GA (OPHTHALMIC) ×1 IMPLANT
PENCIL BIPOLAR 25GA STR DISP (OPHTHALMIC RELATED) IMPLANT
PROBE ENDO DIATHERMY 25G (MISCELLANEOUS) IMPLANT
PROBE LASER ILLUM FLEX CVD 25G (OPHTHALMIC) IMPLANT
SHIELD EYE LENSE ONLY DISP (GAUZE/BANDAGES/DRESSINGS) IMPLANT
SUT VICRYL 7 0 TG140 8 (SUTURE) ×1 IMPLANT
SYR 10ML LL (SYRINGE) ×1 IMPLANT
SYR 20ML LL LF (SYRINGE) ×1 IMPLANT
SYR 5ML LL (SYRINGE) ×1 IMPLANT
SYR TB 1ML LUER SLIP (SYRINGE) ×2 IMPLANT
TOWEL GREEN STERILE FF (TOWEL DISPOSABLE) ×1 IMPLANT
WATER STERILE IRR 1000ML POUR (IV SOLUTION) ×1 IMPLANT

## 2022-08-17 NOTE — Anesthesia Procedure Notes (Signed)
Procedure Name: Intubation Date/Time: 08/17/2022 11:12 AM  Performed by: Pearson Grippe, CRNAPre-anesthesia Checklist: Patient identified, Emergency Drugs available, Suction available and Patient being monitored Patient Re-evaluated:Patient Re-evaluated prior to induction Oxygen Delivery Method: Circle system utilized Preoxygenation: Pre-oxygenation with 100% oxygen Induction Type: IV induction Ventilation: Mask ventilation without difficulty Laryngoscope Size: Miller and 2 Grade View: Grade I Tube type: Oral Tube size: 7.0 mm Number of attempts: 1 Airway Equipment and Method: Stylet and Oral airway Placement Confirmation: ETT inserted through vocal cords under direct vision, positive ETCO2 and breath sounds checked- equal and bilateral Secured at: 21 cm Tube secured with: Tape Dental Injury: Teeth and Oropharynx as per pre-operative assessment

## 2022-08-17 NOTE — Interval H&P Note (Signed)
History and Physical Interval Note:  08/17/2022 10:47 AM  Sabrina Diaz  has presented today for surgery, with the diagnosis of vitreous hemorrhage left eye.  The various methods of treatment have been discussed with the patient and family. After consideration of risks, benefits and other options for treatment, the patient has consented to  Procedure(s): PARS PLANA VITRECTOMY WITH 25 GAUGE (Left) as a surgical intervention.  The patient's history has been reviewed, patient examined, no change in status, stable for surgery.  I have reviewed the patient's chart and labs.  Questions were answered to the patient's satisfaction.     Rennis Chris

## 2022-08-17 NOTE — Brief Op Note (Signed)
08/17/2022  12:53 PM  PATIENT:  Sabrina Diaz  26 y.o. female  PRE-OPERATIVE DIAGNOSIS:  vitreous hemorrhage left eye  POST-OPERATIVE DIAGNOSIS:  vitreous hemorrhage left eye  PROCEDURE:  Procedure(s): TWENTY-FIVE GAUGE PARS PLANA VITRECTOMY (Left)  SURGEON:  Surgeon(s) and Role:    Rennis Chris, MD - Primary  ASSISTANTS: Laurian Brim, Ophthalmic Assistant    ANESTHESIA:   general  EBL:  minimal   BLOOD ADMINISTERED:none  DRAINS: none   LOCAL MEDICATIONS USED:  NONE  SPECIMEN:  No Specimen  DISPOSITION OF SPECIMEN:  N/A  COUNTS:  YES  TOURNIQUET:  * No tourniquets in log *  DICTATION: .Note written in EPIC  PLAN OF CARE: Discharge to home after PACU  PATIENT DISPOSITION:  PACU - hemodynamically stable.   Delay start of Pharmacological VTE agent (>24hrs) due to surgical blood loss or risk of bleeding: no

## 2022-08-17 NOTE — Anesthesia Postprocedure Evaluation (Signed)
Anesthesia Post Note  Patient: Sabrina Diaz  Procedure(s) Performed: TWENTY-FIVE GAUGE PARS PLANA VITRECTOMY (Left: Eye)     Patient location during evaluation: PACU Anesthesia Type: General Level of consciousness: sedated and patient cooperative Pain management: pain level controlled Vital Signs Assessment: post-procedure vital signs reviewed and stable Respiratory status: spontaneous breathing Cardiovascular status: stable Anesthetic complications: no   No notable events documented.  Last Vitals:  Vitals:   08/17/22 1315 08/17/22 1330  BP: 116/79 120/85  Pulse: (!) 58 (!) 58  Resp: 12 11  Temp:  (!) 36.3 C  SpO2: 98% 98%    Last Pain:  Vitals:   08/17/22 1315  PainSc: Asleep                 Lewie Loron

## 2022-08-17 NOTE — Discharge Instructions (Addendum)
POSTOPERATIVE INSTRUCTIONS  Your doctor has performed vitreoretinal surgery on you at [facility].  - Keep eye patched and shielded until seen by Dr. Zamora 8 AM tomorrow in clinic - Do not use drops until return - Sleep with head elevated 45 degrees or greater    - No strenuous bending, stooping or lifting.  - You may not drive until further notice.  - Tylenol or any other over-the-counter pain reliever can be used according to your doctor. If more pain medicine is required, your doctor will have a prescription for you.  - You may read, go up and down stairs, and watch television.     Brian Zamora, M.D., Ph.D.  

## 2022-08-17 NOTE — Transfer of Care (Signed)
Immediate Anesthesia Transfer of Care Note  Patient: Angele P Vandrunen  Procedure(s) Performed: TWENTY-FIVE GAUGE PARS PLANA VITRECTOMY (Left: Eye)  Patient Location: PACU  Anesthesia Type:General  Level of Consciousness: drowsy and patient cooperative  Airway & Oxygen Therapy: Patient Spontanous Breathing and Patient connected to face mask oxygen  Post-op Assessment: Report given to RN and Post -op Vital signs reviewed and stable  Post vital signs: Reviewed and stable  Last Vitals:  Vitals Value Taken Time  BP 114/72   Temp    Pulse 71 08/17/22 1248  Resp 18 08/17/22 1248  SpO2 97 % 08/17/22 1248  Vitals shown include unvalidated device data.  Last Pain:  Vitals:   08/17/22 0958  PainSc: 0-No pain         Complications: No notable events documented.

## 2022-08-17 NOTE — Op Note (Signed)
Date of procedure: 09.14.2023   Surgeon: Rennis Chris, M.D., Ph.D    Assistant: Laurian Brim, OA   Pre-operative Diagnosis:  1. non-clearing vitreous and subhyaloid hemorrhage, LEFT EYE 2. Terson Syndrome   Post-operative diagnosis:  Same   Anesthesia: General   Procedure: 1)     25 gauge pars plana vitrectomy, Left Eye   Complications: none Estimated blood loss: minimal Specimens: none   Brief history:  Pt has a history of vision loss OS following syncopal event. She has a non-clearing vitreous and subhyaloid hemorrhage affecting activities of daily living. The risks, benefits, and alternatives were explained to the patient, including pain, bleeding, infection, loss of vision, double vision, droopy eyelids, and need for more surgeries.  Informed consent was obtained from the patient and placed in the chart.     Procedure: The patient was brought to the preoperative holding area where the correct eye was confirmed and marked.  The patient was then brought to the operating room where general anesthesia was induced by the Anesthesia team. A secondary time-out was performed to identify the correct patient, eye, procedure, and any allergies. The eye was prepped and draped in the usual sterile ophthalmic fashion followed by placement of a lid speculum.    A 25 gauge trocar was placed in the inferotemporal quadrant 4 mm posterior to the limbus in a beveled fashion. A 4 mm infusion cannula was placed through this trocar, and the infusion cannula was confirmed in the vitreous cavity with no incarceration of retina or choroid prior to turning it on. Two additional 25 gauge trocars were placed in the superonasal and superotemporal quadrants in a similar beveled fashion.    At this time, a standard three-port pars plana vitrectomy was performed using the light pipe, the cutter, and the BIOM viewing system. A thorough core and peripheral vitreous dissection was performed. Kenalog was used to  highlight the vitreous. A posterior vitreous detachment was induced using the vitreous cutter with the assistance of kenalog. Upon induction of a PVD, the subhyaloid heme was able to be removed completely. Peripheral vitrectomy was completed with care using scleral depression. This allowed thorough removal of residual vitreous hemorrhage in the periphery. The peripheral retina was inspected under scleral depression for any retinal breaks or tears. There were no peripheral retinal breaks.   The trocars were then removed and sutured with 7-0 vicryl in an interrupted fashion. Subconjunctival injections of antibiotic and Kenalog were administered. The lid speculum and drapes were removed. Drops of an antibiotic and steroid were given. The eye was patched and shielded. The patient tolerated the procedure well without any intraoperative or immediate postoperative complications. The patient was taken to the recovery room in good condition. The patient was instructed to follow-up with Dr. Vanessa Barbara in clinic on the following morning.

## 2022-08-18 ENCOUNTER — Encounter (HOSPITAL_COMMUNITY): Payer: Self-pay | Admitting: Ophthalmology

## 2022-08-18 ENCOUNTER — Ambulatory Visit (INDEPENDENT_AMBULATORY_CARE_PROVIDER_SITE_OTHER): Payer: 59 | Admitting: Ophthalmology

## 2022-08-18 DIAGNOSIS — H3562 Retinal hemorrhage, left eye: Secondary | ICD-10-CM

## 2022-08-18 DIAGNOSIS — H4312 Vitreous hemorrhage, left eye: Secondary | ICD-10-CM | POA: Diagnosis not present

## 2022-08-18 DIAGNOSIS — I608 Other nontraumatic subarachnoid hemorrhage: Secondary | ICD-10-CM

## 2022-08-24 ENCOUNTER — Encounter (INDEPENDENT_AMBULATORY_CARE_PROVIDER_SITE_OTHER): Payer: Self-pay | Admitting: Ophthalmology

## 2022-08-24 ENCOUNTER — Ambulatory Visit (INDEPENDENT_AMBULATORY_CARE_PROVIDER_SITE_OTHER): Payer: 59 | Admitting: Ophthalmology

## 2022-08-24 DIAGNOSIS — I608 Other nontraumatic subarachnoid hemorrhage: Secondary | ICD-10-CM

## 2022-08-24 DIAGNOSIS — H4312 Vitreous hemorrhage, left eye: Secondary | ICD-10-CM

## 2022-08-24 DIAGNOSIS — H3562 Retinal hemorrhage, left eye: Secondary | ICD-10-CM

## 2022-08-24 MED ORDER — BACITRACIN-POLYMYXIN B 500-10000 UNIT/GM OP OINT: TOPICAL_OINTMENT | Freq: Four times a day (QID) | OPHTHALMIC | 3 refills | Status: DC

## 2022-08-24 MED ORDER — PREDNISOLONE ACETATE 1 % OP SUSP: 1.0000 [drp] | Freq: Four times a day (QID) | OPHTHALMIC | 0 refills | Status: DC

## 2022-08-24 NOTE — Progress Notes (Signed)
[facility] - Clinic Note  08/24/2022     CHIEF COMPLAINT Patient presents for Retina Follow Up   HISTORY OF PRESENT ILLNESS: Sabrina Diaz is a 26 y.o. female who presents to the clinic today for:   HPI     Retina Follow Up   Patient presents with  Other.  In left eye.  This started weeks ago.  Severity is moderate.  Duration of 6 days.  Since onset it is stable.  I, the attending physician,  performed the HPI with the patient and updated documentation appropriately.        Comments   Patient states that she is feeling like something is sticking her tear duct in the left eye. She is also seeing black dots in the left eye.       Last edited by Rennis Chris, MD on 08/24/2022 10:48 AM.    Pt states she is seeing "little black specks" in her left eye, she feels like something is "stabbing" her in her tear duct  Referring physician: Nino Glow, PA-C [address] [place],  [place] [address]  HISTORICAL INFORMATION:   Selected notes from the MEDICAL RECORD NUMBER Referred by Dr. Zenaida Niece LEE:  Ocular Hx- Vitreous Hemorrhage OS PMH-    CURRENT MEDICATIONS: Current Outpatient Medications (Ophthalmic Drugs)  Medication Sig   bacitracin-polymyxin b (POLYSPORIN) ophthalmic ointment Place into the left eye 4 (four) times daily. Place a 1/2 inch ribbon of ointment into the lower eyelid.   prednisoLONE acetate (PRED FORTE) 1 % ophthalmic suspension Place 1 drop into the left eye 4 (four) times daily.   No current facility-administered medications for this visit. (Ophthalmic Drugs)   Current Outpatient Medications (Other)  Medication Sig   Calcium Carbonate Antacid (TUMS PO) Take 2 tablets by mouth 2 (two) times daily as needed (acid reflux/heartburn).   cyclobenzaprine (FLEXERIL) 10 MG tablet Take 10 mg by mouth 3 (three) times daily as needed for muscle spasms.   ondansetron (ZOFRAN-ODT) 4 MG disintegrating tablet Take 1 tablet (4 mg total) by  mouth every 12 (twelve) hours as needed for nausea. (Patient not taking: Reported on 08/11/2022)   oxyCODONE-acetaminophen (PERCOCET) 5-325 MG tablet Take 1 tablet by mouth every 4 (four) hours as needed (pain). (Patient not taking: Reported on 08/11/2022)   traZODone (DESYREL) 100 MG tablet Take 1 tablet (100 mg total) by mouth at bedtime. (Patient not taking: Reported on 08/11/2022)   zonisamide (ZONEGRAN) 100 MG capsule Take 2 capsules (200 mg total) by mouth daily. Take 100 mg (1 pill) daily for two weeks, then increase to 200 mg (2 pills) daily. (Patient not taking: Reported on 08/18/2022)   No current facility-administered medications for this visit. (Other)   REVIEW OF SYSTEMS: ROS   Positive for: Neurological, Eyes Negative for: Constitutional, Gastrointestinal, Skin, Genitourinary, Musculoskeletal, HENT, Endocrine, Cardiovascular, Respiratory, Psychiatric, Allergic/Imm, Heme/Lymph Last edited by Julieanne Cotton, COT on 08/24/2022  8:15 AM.     ALLERGIES No Known Allergies  PAST MEDICAL HISTORY Past Medical History:  Diagnosis Date   Anxiety    Chiari malformation type I (HCC)    Chronic headaches    Depression    Past Surgical History:  Procedure Laterality Date   APPLICATION OF CRANIAL NAVIGATION  06/13/2022   Procedure: APPLICATION OF CRANIAL NAVIGATION;  Surgeon: Jadene Pierini, MD;  Location: [facility] OR;  Service: Neurosurgery;;   PARS PLANA VITRECTOMY Left 08/17/2022   Procedure: TWENTY-FIVE GAUGE PARS PLANA VITRECTOMY;  Surgeon:  Rennis Chris, MD;  Location: [facility] OR;  Service: Ophthalmology;  Laterality: Left;   SHUNT REMOVAL N/A 06/13/2022   Procedure: Lumboperitoneal Shunt Removal;  Surgeon: Jadene Pierini, MD;  Location: [facility] OR;  Service: Neurosurgery;  Laterality: N/A;   SUBOCCIPITAL CRANIECTOMY CERVICAL LAMINECTOMY N/A 04/03/2022   Procedure: Chiari decompression;  Surgeon: Jadene Pierini, MD;  Location: [facility] OR;  Service: Neurosurgery;  Laterality: N/A;  RM 21    VENTRICULOPERITONEAL SHUNT N/A 04/29/2022   Procedure: LUMBOPERITONEAL SHUNT PLACEMENT;  Surgeon: Jadene Pierini, MD;  Location: [facility] OR;  Service: Neurosurgery;  Laterality: N/A;   VENTRICULOPERITONEAL SHUNT Right 06/13/2022   Procedure: Right Ventriculoperitoneal Shunt Placement;  Surgeon: Jadene Pierini, MD;  Location: [facility] OR;  Service: Neurosurgery;  Laterality: Right;   FAMILY HISTORY History reviewed. No pertinent family history.  SOCIAL HISTORY Social History   Tobacco Use   Smoking status: Former    Types: Cigarettes    Quit date: 07/2021    Years since quitting: 1.1   Smokeless tobacco: Never  Vaping Use   Vaping Use: Some days  Substance Use Topics   Alcohol use: Yes    Comment: socially   Drug use: Not Currently    Types: Marijuana    Comment: Quit in 12/2021       OPHTHALMIC EXAM:  Base Eye Exam     Visual Acuity (Snellen - Linear)       Right Left   Dist [place] 20/50 20/60 +1   Dist ph [place] 20/30 20/40         Tonometry (Tonopen, 8:23 AM)       Right Left   Pressure 20 16         Pupils       Dark Light Shape React APD   Right 3 3 Round Minimal None   Left Dilated             Extraocular Movement       Right Left    Full, Ortho Full, Ortho         Neuro/Psych     Oriented x3: Yes   Mood/Affect: Normal         Dilation     Both eyes: 1.0% Mydriacyl, 2.5% Phenylephrine @ 8:15 AM           Slit Lamp and Fundus Exam     External Exam       Right Left   External Normal Normal         Slit Lamp Exam       Right Left   Lids/Lashes Mild MGD Normal   Conjunctiva/Sclera White and quiet sutures intact, 1-2+ Injection nasally   Cornea Debris in tear film trace PEE, mild tear film debris   Anterior Chamber Deep and quiet deep and clear   Iris Positive PPM, Round and dilated Round and dilated   Lens Clear Clear   Anterior Vitreous Normal post vitrectomy, clear         Fundus Exam       Right Left   Disc  Pink and sharp, Compact Pink and sharp, Compact   C/D Ratio 0.1 0.3   Macula Flat, Good foveal reflex, No heme, No edema Flat, good foveal reflex, subhyaloid heme gone, mild pigment changes / demarcation line temporal macula and along arcades   Vessels Normal attenuated, mild tortuosity   Periphery Attached Attached, no heme           Refraction  Manifest Refraction       Sphere Cylinder Dist VA   Right -1.25 Sphere 20/20   Left -0.75 Sphere 20/30           IMAGING AND PROCEDURES  Imaging and Procedures for 08/24/2022  OCT, Retina - OU - Both Eyes       Right Eye Quality was good. Central Foveal Thickness: 279. Progression has been stable. Findings include normal foveal contour, no IRF, no SRF, vitreomacular adhesion .   Left Eye Quality was good. Central Foveal Thickness: 352. Progression has improved. Findings include no IRF, no SRF, abnormal foveal contour (large subhyaloid heme gone; interval improvement in vit opacities).   Notes *Images captured and stored on drive  Diagnosis / Impression:  OD: NFP; no IRF/SRF  OS: large subhyaloid heme gone; interval improvement in vit opacities -- VH improved  Clinical management:  See below  Abbreviations: NFP - Normal foveal profile. CME - cystoid macular edema. PED - pigment epithelial detachment. IRF - intraretinal fluid. SRF - subretinal fluid. EZ - ellipsoid zone. ERM - epiretinal membrane. ORA - outer retinal atrophy. ORT - outer retinal tubulation. SRHM - subretinal hyper-reflective material. IRHM - intraretinal hyper-reflective material            ASSESSMENT/PLAN:    ICD-10-CM   1. Vitreous hemorrhage of left eye (HCC)  H43.12 OCT, Retina - OU - Both Eyes    2. 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 Vitreous and subhyaloid hemorrhage / Terson's syndrome OS  - history of Chiari malformation -- had VP shunt placed 5.27.23  - had  syncopal episode on 7.3.23 and came to w/ decreased central vision OS -- large central subhyaloid heme  - was followed by Dr. Zenaida NieceVan during hospitalization - pre op BCVA CF - POW1 s/p PPV/EL/FAX OS, 09.14.2021             - doing well -- subhyaloid and vit hemes cleared - BCVA OS 20/40 from CF -- stable - retina attached and in good position             - IOP good at 16             - start PF taper -- 4,3,2,1 drops daily, decrease weekly - cont zymaxid QID OS -- stop on Monday, September 25 - stop Atropine BID OS - cont PSO ung QID OS              - eye shield when sleeping x1 more week             - post op drop and positioning instructions reviewed   - f/u 3-4 weeks, POV -- DFE, OCT  Ophthalmic Meds Ordered this visit:  Meds ordered this encounter  Medications   prednisoLONE acetate (PRED FORTE) 1 % ophthalmic suspension    Sig: Place 1 drop into the left eye 4 (four) times daily.    Dispense:  15 mL    Refill:  0   bacitracin-polymyxin b (POLYSPORIN) ophthalmic ointment    Sig: Place into the left eye 4 (four) times daily. Place a 1/2 inch ribbon of ointment into the lower eyelid.    Dispense:  3.5 g    Refill:  3     Return for f/u 3-4 weeks, VH OS, DFE, OCT.  There are no Patient Instructions on file for this  visit.  Explained the diagnoses, plan, and follow up with the patient and they expressed understanding.  Patient expressed understanding of the importance of proper follow up care.   This document serves as a record of services personally performed by Gardiner Sleeper, MD, PhD. It was created on their behalf by San Jetty. Owens Shark, OA an ophthalmic technician. The creation of this record is the provider's dictation and/or activities during the visit.    Electronically signed by: San Jetty. Owens Shark, [place] 09.21.2023 10:50 AM   Gardiner Sleeper, M.D., Ph.D. Diseases & Surgery of the Retina and Vitreous [facility]  I have reviewed the above documentation  for accuracy and completeness, and I agree with the above. Gardiner Sleeper, M.D., Ph.D. 08/24/22 10:50 AM  Abbreviations: M myopia (nearsighted); A astigmatism; H hyperopia (farsighted); P presbyopia; Mrx spectacle prescription;  CTL contact lenses; OD right eye; OS left eye; OU both eyes  XT exotropia; ET esotropia; PEK punctate epithelial keratitis; PEE punctate epithelial erosions; DES dry eye syndrome; MGD meibomian gland dysfunction; ATs artificial tears; PFAT's preservative free artificial tears; [place] nuclear sclerotic cataract; PSC posterior subcapsular cataract; ERM epi-retinal membrane; PVD posterior vitreous detachment; RD retinal detachment; DM diabetes mellitus; DR diabetic retinopathy; NPDR non-proliferative diabetic retinopathy; PDR proliferative diabetic retinopathy; CSME clinically significant macular edema; DME diabetic macular edema; dbh dot blot hemorrhages; CWS cotton wool spot; POAG primary open angle glaucoma; C/D cup-to-disc ratio; HVF humphrey visual field; GVF goldmann visual field; OCT optical coherence tomography; IOP intraocular pressure; BRVO Branch retinal vein occlusion; CRVO central retinal vein occlusion; CRAO central retinal artery occlusion; BRAO branch retinal artery occlusion; RT retinal tear; SB scleral buckle; PPV pars plana vitrectomy; VH Vitreous hemorrhage; PRP panretinal laser photocoagulation; IVK intravitreal kenalog; VMT vitreomacular traction; MH Macular hole;  NVD neovascularization of the disc; NVE neovascularization elsewhere; AREDS age related eye disease study; ARMD age related macular degeneration; POAG primary open angle glaucoma; EBMD epithelial/anterior basement membrane dystrophy; ACIOL anterior chamber intraocular lens; IOL intraocular lens; PCIOL posterior chamber intraocular lens; Phaco/IOL phacoemulsification with intraocular lens placement; [place] photorefractive keratectomy; LASIK laser assisted in situ keratomileusis; HTN hypertension; DM diabetes  mellitus; COPD chronic obstructive pulmonary disease

## 2022-09-05 ENCOUNTER — Ambulatory Visit (INDEPENDENT_AMBULATORY_CARE_PROVIDER_SITE_OTHER): Payer: BC Managed Care – PPO | Admitting: Psychiatry

## 2022-09-05 ENCOUNTER — Telehealth: Payer: Self-pay | Admitting: Psychiatry

## 2022-09-05 VITALS — Ht 66.0 in | Wt 225.1 lb

## 2022-09-05 DIAGNOSIS — G935 Compression of brain: Secondary | ICD-10-CM

## 2022-09-05 NOTE — Progress Notes (Signed)
   CC:  headaches  Follow-up Visit  Last visit: 04/26/22  Brief HPI: 26 year old female with a history of Chiari malformation s/p suboccipital decompression 04/03/22 and s/p VP shunt 06/05/22, anxiety, and depression who follows in clinic for headaches.  At her last visit she was started on Zonisamide for headache prevention. She was also started on Trazodone at bedtime.  Interval History: On 06/05/22 she was found to be unresponsive. She was found to have a vitreous hemorrhage and papilledema. LP was done which showed opening pressure of 32 cm. She underwent VP shunt placement 06/13/22.  Since VP shunt was placed, her headaches have resolved. Did not start rizatriptan and has not needed anything for rescue. She continues to take Zonisamide 200 mg daily without side effects.  Continues to struggle with insomnia Stopped trazodone because it wasn't helping.  Headache days per month: 0 Headache free days per month: 30  Current Headache Regimen: Preventative: Zonisamide 200 mg daily   Prior Therapies                                  Topamax 50 mg daily Zonisamide 200 mg daily Nortriptyline 50 mg QHS  Physical Exam:   Vital Signs: Ht 5\' 6"  (1.676 m)   Wt 225 lb 1 oz (102.1 kg)   LMP 07/21/2022 (Exact Date)   BMI 36.33 kg/m  GENERAL:  well appearing, in no acute distress, alert  SKIN:  Color, texture, turgor normal. No rashes or lesions HEAD:  Normocephalic/atraumatic. RESP: normal respiratory effort MSK:  No gross joint deformities.   NEUROLOGICAL: Mental Status: Alert, oriented to person, place and time, Follows commands, and Speech fluent and appropriate. Cranial Nerves: PERRL, face symmetric, no dysarthria, hearing grossly intact Motor: moves all extremities equally Gait: normal-based.  IMPRESSION: 26 year old female with a history of Chiari malformation s/p suboccipital decompression 04/03/22 and s/p VP shunt 06/05/22, anxiety, and depression who presents for follow up of  headaches. Her headaches have resolved since decompression and VP shunt placement. Will continue Zonisamide for now, may consider attempting to wean in the future if she remains stable.   PLAN: -Prevention: Continue Zonisamide 200 mg daily -Recommend she follow up with PCP to discuss insomnia management  Follow-up: 6 months  I spent a total of 27 minutes on the date of the service. Headache education was done. Discussed medication side effects, adverse reactions and drug interactions. Written educational materials and patient instructions outlining all of the above were given.  Genia Harold, MD 09/05/22 9:21 AM

## 2022-09-05 NOTE — Telephone Encounter (Signed)
Referral faxed to Dr. Zada Finders at [facility], phone # [phone].

## 2022-09-05 NOTE — Patient Instructions (Signed)
[email or web address]

## 2022-09-08 NOTE — Progress Notes (Signed)
[facility] Note  09/15/2022     CHIEF COMPLAINT Patient presents for Retina Follow Up   HISTORY OF PRESENT ILLNESS: TRANISE Diaz is a 26 y.o. female who presents to the clinic today for:   HPI     Retina Follow Up   Patient presents with  Other.  In left eye.  This started 3 weeks ago.  I, the attending physician,  performed the HPI with the patient and updated documentation appropriately.        Comments   Patient here for 3 weeks retina follow up for VH OS. Patient states vision doing good. Has eye pain. Still having swelling. Still using steroid drop. Now once a day started yesterday.      Last edited by Bernarda Caffey, MD on 09/16/2022 11:56 AM.    Pt started using PF once a day yesterday, rarely using PSO Ung, can tell vision is improving, is having throbbing pain in the left eye, mainly at night or when laying down   Referring physician: Ardeen Garland, PA-C [place],  [place] [address]  HISTORICAL INFORMATION:   Selected notes from the MEDICAL RECORD NUMBER Referred by Dr. Lucianne Lei LEE:  Ocular Hx- Vitreous Hemorrhage OS PMH-    CURRENT MEDICATIONS: Current Outpatient Medications (Ophthalmic Drugs)  Medication Sig   bacitracin-polymyxin b (POLYSPORIN) ophthalmic ointment Place into the left eye 4 (four) times daily. Place a 1/2 inch ribbon of ointment into the lower eyelid.   prednisoLONE acetate (PRED FORTE) 1 % ophthalmic suspension Place 1 drop into the left eye 4 (four) times daily.   No current facility-administered medications for this visit. (Ophthalmic Drugs)   Current Outpatient Medications (Other)  Medication Sig   Calcium Carbonate Antacid (TUMS PO) Take 2 tablets by mouth 2 (two) times daily as needed (acid reflux/heartburn).   cyclobenzaprine (FLEXERIL) 10 MG tablet Take 10 mg by mouth 3 (three) times daily as needed for muscle spasms.   zonisamide (ZONEGRAN) 100 MG capsule Take 2 capsules (200  mg total) by mouth daily. Take 100 mg (1 pill) daily for two weeks, then increase to 200 mg (2 pills) daily.   ondansetron (ZOFRAN-ODT) 4 MG disintegrating tablet Take 1 tablet (4 mg total) by mouth every 12 (twelve) hours as needed for nausea. (Patient not taking: Reported on 08/11/2022)   traZODone (DESYREL) 100 MG tablet Take 1 tablet (100 mg total) by mouth at bedtime. (Patient not taking: Reported on 08/11/2022)   No current facility-administered medications for this visit. (Other)   REVIEW OF SYSTEMS: ROS   Positive for: Neurological, Eyes Negative for: Constitutional, Gastrointestinal, Skin, Genitourinary, Musculoskeletal, HENT, Endocrine, Cardiovascular, Respiratory, Psychiatric, Allergic/Imm, Heme/Lymph Last edited by Theodore Demark, COA on 09/15/2022  8:28 AM.      ALLERGIES No Known Allergies  PAST MEDICAL HISTORY Past Medical History:  Diagnosis Date   Anxiety    Chiari malformation type I ([place])    Chronic headaches    Depression    Past Surgical History:  Procedure Laterality Date   APPLICATION OF CRANIAL NAVIGATION  06/13/2022   Procedure: APPLICATION OF CRANIAL NAVIGATION;  Surgeon: Judith Part, MD;  Location: [facility];  Service: Neurosurgery;;   PARS PLANA VITRECTOMY Left 08/17/2022   Procedure: [place] VITRECTOMY;  Surgeon: Bernarda Caffey, MD;  Location: [facility];  Service: Ophthalmology;  Laterality: Left;   SHUNT REMOVAL N/A 06/13/2022   Procedure: Lumboperitoneal Shunt Removal;  Surgeon: Judith Part,  MD;  Location: [facility];  Service: Neurosurgery;  Laterality: N/A;   SUBOCCIPITAL CRANIECTOMY CERVICAL LAMINECTOMY N/A 04/03/2022   Procedure: Chiari decompression;  Surgeon: Judith Part, MD;  Location: [facility];  Service: Neurosurgery;  Laterality: N/A;  RM 21   VENTRICULOPERITONEAL SHUNT N/A 04/29/2022   Procedure: LUMBOPERITONEAL SHUNT PLACEMENT;  Surgeon: Judith Part, MD;  Location: [facility];  Service: Neurosurgery;   Laterality: N/A;   VENTRICULOPERITONEAL SHUNT Right 06/13/2022   Procedure: Right Ventriculoperitoneal Shunt Placement;  Surgeon: Judith Part, MD;  Location: [facility];  Service: Neurosurgery;  Laterality: Right;   FAMILY HISTORY History reviewed. No pertinent family history.  SOCIAL HISTORY Social History   Tobacco Use   Smoking status: Former    Types: Cigarettes    Quit date: 07/2021    Years since quitting: 1.2   Smokeless tobacco: Never  Vaping Use   Vaping Use: Some days  Substance Use Topics   Alcohol use: Yes    Comment: socially   Drug use: Not Currently    Types: Marijuana    Comment: Quit in 12/2021       OPHTHALMIC EXAM:  Base Eye Exam     Visual Acuity (Snellen - Linear)       Right Left   Dist [place] 20/50 -2 20/50   Dist ph [place] 20/25 20/30         Tonometry (Tonopen, 8:23 AM)       Right Left   Pressure 13 19         Pupils       Dark Light Shape React APD   Right 4 3 Round Minimal None   Left 4 4 Round Minimal None         Visual Fields (Counting fingers)       Left Right    Full Full         Extraocular Movement       Right Left    Full, Ortho Full, Ortho         Neuro/Psych     Oriented x3: Yes   Mood/Affect: Normal         Dilation     Both eyes: 1.0% Mydriacyl, 2.5% Phenylephrine @ 8:23 AM           Slit Lamp and Fundus Exam     External Exam       Right Left   External Normal Normal         Slit Lamp Exam       Right Left   Lids/Lashes Mild MGD Normal   Conjunctiva/Sclera White and quiet sutures intact   Cornea Debris in tear film Clear   Anterior Chamber Deep and quiet deep and clear   Iris Positive PPM, Round and dilated Round and dilated   Lens Clear Clear   Anterior Vitreous Normal post vitrectomy, clear         Fundus Exam       Right Left   Disc Pink and sharp, Compact, mild tilt, temporal PPA Pink and sharp, Compact   C/D Ratio 0.2 0.3   Macula Flat, Good foveal reflex, No  heme, No edema Flat, good foveal reflex, no heme or edema   Vessels Normal mild attenuation, mild copper wiring   Periphery Attached Attached, no heme           IMAGING AND PROCEDURES  Imaging and Procedures for 09/15/2022  OCT, Retina - OU - Both Eyes  Right Eye Quality was good. Central Foveal Thickness: 279. Progression has been stable. Findings include normal foveal contour, no IRF, no SRF, vitreomacular adhesion .   Left Eye Quality was good. Central Foveal Thickness: 362. Progression has improved. Findings include no IRF, no SRF, abnormal foveal contour (large subhyaloid heme gone; interval improvement in vit opacities).   Notes *Images captured and stored on drive  Diagnosis / Impression:  OD: NFP; no IRF/SRF  OS: large subhyaloid heme gone; interval improvement in vit opacities -- VH improved  Clinical management:  See below  Abbreviations: NFP - Normal foveal profile. CME - cystoid macular edema. PED - pigment epithelial detachment. IRF - intraretinal fluid. SRF - subretinal fluid. EZ - ellipsoid zone. ERM - epiretinal membrane. ORA - outer retinal atrophy. ORT - outer retinal tubulation. SRHM - subretinal hyper-reflective material. IRHM - intraretinal hyper-reflective material            ASSESSMENT/PLAN:    ICD-10-CM   1. Vitreous hemorrhage of left eye (HCC)  H43.12 OCT, Retina - OU - Both Eyes    2. Subhyaloid hemorrhage of left eye  H35.62     3. 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 Vitreous and subhyaloid hemorrhage / Terson's syndrome OS  - history of Chiari malformation -- had VP shunt placed 5.27.23  - had syncopal episode on 7.3.23 and came to w/ decreased central vision OS -- large central subhyaloid heme  - was followed by Dr. Lucianne Lei during hospitalization - pre op BCVA CF - POW4 s/p PPV/EL/FAX OS, 09.14.2021             - doing well -- subhyaloid and vit hemes cleared - BCVA OS 20/30 from 20/40 -- stable - retina  attached and in good position             - IOP good at 16             - cont PF qdaily x1 more week - cont PSO ung QID OS -- okay to stop             - post op drop and positioning instructions reviewed   - f/u 4 weeks, POV -- DFE, OCT  Ophthalmic Meds Ordered this visit:  No orders of the defined types were placed in this encounter.    Return in about 4 weeks (around 10/13/2022) for f/u VH OS, DFE, OCT.  There are no Patient Instructions on file for this visit.  Explained the diagnoses, plan, and follow up with the patient and they expressed understanding.  Patient expressed understanding of the importance of proper follow up care.   This document serves as a record of services personally performed by Gardiner Sleeper, MD, PhD. It was created on their behalf by San Jetty. Owens Shark, OA an ophthalmic technician. The creation of this record is the provider's dictation and/or activities during the visit.    Electronically signed by: San Jetty. Owens Shark, [place] 10.06.2023 11:58 AM  Gardiner Sleeper, M.D., Ph.D. Diseases & Surgery of the Retina and Vitreous [facility]  I have reviewed the above documentation for accuracy and completeness, and I agree with the above. Gardiner Sleeper, M.D., Ph.D. 09/16/22 11:59 AM   Abbreviations: M myopia (nearsighted); A astigmatism; H hyperopia (farsighted); P presbyopia; Mrx spectacle prescription;  CTL contact lenses; OD right eye; OS left eye; OU both eyes  XT exotropia; ET esotropia; PEK  punctate epithelial keratitis; PEE punctate epithelial erosions; DES dry eye syndrome; MGD meibomian gland dysfunction; ATs artificial tears; PFAT's preservative free artificial tears; [place] nuclear sclerotic cataract; PSC posterior subcapsular cataract; ERM epi-retinal membrane; PVD posterior vitreous detachment; RD retinal detachment; DM diabetes mellitus; DR diabetic retinopathy; NPDR non-proliferative diabetic retinopathy; PDR proliferative diabetic  retinopathy; CSME clinically significant macular edema; DME diabetic macular edema; dbh dot blot hemorrhages; CWS cotton wool spot; POAG primary open angle glaucoma; C/D cup-to-disc ratio; HVF humphrey visual field; GVF goldmann visual field; OCT optical coherence tomography; IOP intraocular pressure; BRVO Branch retinal vein occlusion; CRVO central retinal vein occlusion; CRAO central retinal artery occlusion; BRAO branch retinal artery occlusion; RT retinal tear; SB scleral buckle; PPV pars plana vitrectomy; VH Vitreous hemorrhage; PRP panretinal laser photocoagulation; IVK intravitreal kenalog; VMT vitreomacular traction; MH Macular hole;  NVD neovascularization of the disc; NVE neovascularization elsewhere; AREDS age related eye disease study; ARMD age related macular degeneration; POAG primary open angle glaucoma; EBMD epithelial/anterior basement membrane dystrophy; ACIOL anterior chamber intraocular lens; IOL intraocular lens; PCIOL posterior chamber intraocular lens; Phaco/IOL phacoemulsification with intraocular lens placement; [place] photorefractive keratectomy; LASIK laser assisted in situ keratomileusis; HTN hypertension; DM diabetes mellitus; COPD chronic obstructive pulmonary disease

## 2022-09-15 ENCOUNTER — Encounter (INDEPENDENT_AMBULATORY_CARE_PROVIDER_SITE_OTHER): Payer: Self-pay | Admitting: Ophthalmology

## 2022-09-15 ENCOUNTER — Ambulatory Visit (INDEPENDENT_AMBULATORY_CARE_PROVIDER_SITE_OTHER): Payer: BC Managed Care – PPO | Admitting: Ophthalmology

## 2022-09-15 DIAGNOSIS — I608 Other nontraumatic subarachnoid hemorrhage: Secondary | ICD-10-CM

## 2022-09-15 DIAGNOSIS — H4312 Vitreous hemorrhage, left eye: Secondary | ICD-10-CM | POA: Diagnosis not present

## 2022-09-15 DIAGNOSIS — H3562 Retinal hemorrhage, left eye: Secondary | ICD-10-CM

## 2022-09-16 ENCOUNTER — Encounter (INDEPENDENT_AMBULATORY_CARE_PROVIDER_SITE_OTHER): Payer: Self-pay | Admitting: Ophthalmology

## 2022-10-12 NOTE — Progress Notes (Signed)
[facility] - Clinic Note  10/13/2022     CHIEF COMPLAINT Patient presents for Retina Follow Up   HISTORY OF PRESENT ILLNESS: Sabrina Diaz is a 26 y.o. female who presents to the clinic today for:   HPI     Retina Follow Up   Patient presents with  Other.  In left eye.  This started 4 weeks ago.  I, the attending physician,  performed the HPI with the patient and updated documentation appropriately.        Comments   Patient here for 4 weeks retina follow up for POV OS. Patient states vision doing good. No eye pain. Brought glasses today.      Last edited by Rennis Chris, MD on 10/13/2022  4:12 PM.    Pt states vision has improved, she is off all drops, she sees her neurosurgeon next month, she is not having any problems with her shunt and no headaches, she is taking zonegran as a maintenance seizure medication   Referring physician: Diona Foley, MD [address] [place],  [place] [address]  HISTORICAL INFORMATION:   Selected notes from the MEDICAL RECORD NUMBER Referred by Dr. Zenaida Niece LEE:  Ocular Hx- Vitreous Hemorrhage OS PMH-    CURRENT MEDICATIONS: Current Outpatient Medications (Ophthalmic Drugs)  Medication Sig   bacitracin-polymyxin b (POLYSPORIN) ophthalmic ointment Place into the left eye 4 (four) times daily. Place a 1/2 inch ribbon of ointment into the lower eyelid.   prednisoLONE acetate (PRED FORTE) 1 % ophthalmic suspension Place 1 drop into the left eye 4 (four) times daily.   No current facility-administered medications for this visit. (Ophthalmic Drugs)   Current Outpatient Medications (Other)  Medication Sig   Calcium Carbonate Antacid (TUMS PO) Take 2 tablets by mouth 2 (two) times daily as needed (acid reflux/heartburn).   cyclobenzaprine (FLEXERIL) 10 MG tablet Take 10 mg by mouth 3 (three) times daily as needed for muscle spasms.   zonisamide (ZONEGRAN) 100 MG capsule Take 2 capsules (200 mg total) by mouth daily.  Take 100 mg (1 pill) daily for two weeks, then increase to 200 mg (2 pills) daily.   ondansetron (ZOFRAN-ODT) 4 MG disintegrating tablet Take 1 tablet (4 mg total) by mouth every 12 (twelve) hours as needed for nausea. (Patient not taking: Reported on 08/11/2022)   traZODone (DESYREL) 100 MG tablet Take 1 tablet (100 mg total) by mouth at bedtime. (Patient not taking: Reported on 08/11/2022)   No current facility-administered medications for this visit. (Other)   REVIEW OF SYSTEMS: ROS   Positive for: Neurological, Eyes Negative for: Constitutional, Gastrointestinal, Skin, Genitourinary, Musculoskeletal, HENT, Endocrine, Cardiovascular, Respiratory, Psychiatric, Allergic/Imm, Heme/Lymph Last edited by Laddie Aquas, COA on 10/13/2022  8:12 AM.     ALLERGIES No Known Allergies  PAST MEDICAL HISTORY Past Medical History:  Diagnosis Date   Anxiety    Chiari malformation type I (HCC)    Chronic headaches    Depression    Past Surgical History:  Procedure Laterality Date   APPLICATION OF CRANIAL NAVIGATION  06/13/2022   Procedure: APPLICATION OF CRANIAL NAVIGATION;  Surgeon: Jadene Pierini, MD;  [facility];  Service: Neurosurgery;;   PARS PLANA VITRECTOMY Left 08/17/2022   Procedure: TWENTY-FIVE GAUGE PARS PLANA VITRECTOMY;  Surgeon: Rennis Chris, MD;  Location: [facility];  Service: Ophthalmology;  Laterality: Left;   SHUNT REMOVAL N/A 06/13/2022   Procedure: Lumboperitoneal Shunt Removal;  Surgeon: Jadene Pierini, MD;  Location[facility];  Service: Neurosurgery;  Laterality: N/A;   SUBOCCIPITAL CRANIECTOMY CERVICAL LAMINECTOMY N/A 04/03/2022   Procedure: Chiari decompression;  Surgeon: Jadene Pierini, MD;  Location[facility];  Service: Neurosurgery;  Laterality: N/A;  RM 21   VENTRICULOPERITONEAL SHUNT N/A 04/29/2022   Procedure: LUMBOPERITONEAL SHUNT PLACEMENT;  Surgeon: Jadene Pierini, MD;  Location[facility];  Service: Neurosurgery;  Laterality: N/A;    VENTRICULOPERITONEAL SHUNT Right 06/13/2022   Procedure: Right Ventriculoperitoneal Shunt Placement;  Surgeon: Jadene Pierini, MD;  Location[facility] OR;  Service: Neurosurgery;  Laterality: Right;   FAMILY HISTORY History reviewed. No pertinent family history.  SOCIAL HISTORY Social History   Tobacco Use   Smoking status: Former    Types: Cigarettes    Quit date: 07/2021    Years since quitting: 1.2   Smokeless tobacco: Never  Vaping Use   Vaping Use: Some days  Substance Use Topics   Alcohol use: Yes    Comment: socially   Drug use: Not Currently    Types: Marijuana    Comment: Quit in 12/2021       OPHTHALMIC EXAM:  Base Eye Exam     Visual Acuity (Snellen - Linear)       Right Left   Dist cc 20/20 20/25   Dist ph cc  20/25 +1    Correction: Glasses  brought glasses today.        Tonometry (Tonopen, 8:09 AM)       Right Left   Pressure 12 14         Pupils       Dark Light Shape React APD   Right 4 3 Round Minimal None   Left 4 4 Round Minimal None         Visual Fields (Counting fingers)       Left Right    Full Full         Extraocular Movement       Right Left    Full, Ortho Full, Ortho         Neuro/Psych     Oriented x3: Yes   Mood/Affect: Normal         Dilation     Both eyes: 1.0% Mydriacyl, 2.5% Phenylephrine @ 8:09 AM           Slit Lamp and Fundus Exam     External Exam       Right Left   External Normal Normal         Slit Lamp Exam       Right Left   Lids/Lashes Mild MGD Normal   Conjunctiva/Sclera White and quiet sutures dissolved   Cornea Debris in tear film Clear   Anterior Chamber Deep and quiet deep and clear   Iris Positive PPM, Round and dilated Round and dilated   Lens Clear Clear   Anterior Vitreous Normal post vitrectomy, clear         Fundus Exam       Right Left   Disc Pink and sharp, Compact, mild tilt, temporal PPA Pink and sharp, Compact, temporal PPA, tilted   C/D  Ratio 0.2 0.3   Macula Flat, Good foveal reflex, No heme, No edema Flat, good foveal reflex, RPE mottling, No heme or edema   Vessels Normal mild attenuation, mild copper wiring   Periphery Attached Attached, no heme           Refraction     Wearing Rx       Sphere  Cylinder Axis Add   Right -1.75 +0.25 021 +0.00   Left -1.00 Sphere  +0.00           IMAGING AND PROCEDURES  Imaging and Procedures for 10/13/2022  OCT, Retina - OU - Both Eyes       Right Eye Quality was good. Central Foveal Thickness: 281. Progression has been stable. Findings include normal foveal contour, no IRF, no SRF, vitreomacular adhesion .   Left Eye Quality was good. Central Foveal Thickness: 365. Progression has been stable. Findings include no IRF, no SRF, abnormal foveal contour (large subhyaloid heme gone; stable improvement in vit opacities).   Notes *Images captured and stored on drive  Diagnosis / Impression:  OD: NFP; no IRF/SRF  OS: large subhyaloid heme gone; stable improvement in vit opacities   Clinical management:  See below  Abbreviations: NFP - Normal foveal profile. CME - cystoid macular edema. PED - pigment epithelial detachment. IRF - intraretinal fluid. SRF - subretinal fluid. EZ - ellipsoid zone. ERM - epiretinal membrane. ORA - outer retinal atrophy. ORT - outer retinal tubulation. SRHM - subretinal hyper-reflective material. IRHM - intraretinal hyper-reflective material            ASSESSMENT/PLAN:    ICD-10-CM   1. Vitreous hemorrhage of left eye (HCC)  H43.12 OCT, Retina - OU - Both Eyes    2. Subhyaloid hemorrhage of left eye  H35.62 OCT, Retina - OU - Both Eyes    3. Terson syndrome of left eye (HCC)  H43.12 OCT, Retina - OU - Both Eyes   I60.8       1-3. Vitreous and subhyaloid hemorrhage / Terson's syndrome OS  - history of Chiari malformation -- had VP shunt placed 5.27.23  - had syncopal episode on 7.3.23 and came to w/ decreased central vision OS --  large central subhyaloid heme  - was followed by Dr. Zenaida Niece during hospitalization - pre op BCVA CF - POW8 s/p PPV/EL/FAX OS, 09.14.2023             - doing well -- subhyaloid and vit hemes cleared - BCVA OS 20/25 from 20/30 -- improved - retina attached and in good position             - IOP good at 14  - f/u 6 months, DFE, OCT  Ophthalmic Meds Ordered this visit:  No orders of the defined types were placed in this encounter.    Return in about 6 months (around 04/13/2023) for f/u VH OS, DFE, OCT.  There are no Patient Instructions on file for this visit.  Explained the diagnoses, plan, and follow up with the patient and they expressed understanding.  Patient expressed understanding of the importance of proper follow up care.   This document serves as a record of services personally performed by Karie Chimera, MD, PhD. It was created on their behalf by De Blanch, an ophthalmic technician. The creation of this record is the provider's dictation and/or activities during the visit.    Electronically signed by: De Blanch, OA, 10/13/22  4:13 PM  This document serves as a record of services personally performed by Karie Chimera, MD, PhD. It was created on their behalf by Glee Arvin. Manson Passey, OA an ophthalmic technician. The creation of this record is the provider's dictation and/or activities during the visit.    Electronically signed by: Glee Arvin. [place], [place] 11.10.2023 4:13 PM  Karie Chimera, M.D., Ph.D. Diseases & Surgery of the Retina and Vitreous  [facility]  I have reviewed the above documentation for accuracy and completeness, and I agree with the above. Karie Chimera, M.D., Ph.D. 10/13/22 4:13 PM  Abbreviations: M myopia (nearsighted); A astigmatism; H hyperopia (farsighted); P presbyopia; Mrx spectacle prescription;  CTL contact lenses; OD right eye; OS left eye; OU both eyes  XT exotropia; ET esotropia; PEK punctate epithelial keratitis; PEE  punctate epithelial erosions; DES dry eye syndrome; MGD meibomian gland dysfunction; ATs artificial tears; PFAT's preservative free artificial tears; [place] nuclear sclerotic cataract; PSC posterior subcapsular cataract; ERM epi-retinal membrane; PVD posterior vitreous detachment; RD retinal detachment; DM diabetes mellitus; DR diabetic retinopathy; NPDR non-proliferative diabetic retinopathy; PDR proliferative diabetic retinopathy; CSME clinically significant macular edema; DME diabetic macular edema; dbh dot blot hemorrhages; CWS cotton wool spot; POAG primary open angle glaucoma; C/D cup-to-disc ratio; HVF humphrey visual field; GVF goldmann visual field; OCT optical coherence tomography; IOP intraocular pressure; BRVO Branch retinal vein occlusion; CRVO central retinal vein occlusion; CRAO central retinal artery occlusion; BRAO branch retinal artery occlusion; RT retinal tear; SB scleral buckle; PPV pars plana vitrectomy; VH Vitreous hemorrhage; PRP panretinal laser photocoagulation; IVK intravitreal kenalog; VMT vitreomacular traction; MH Macular hole;  NVD neovascularization of the disc; NVE neovascularization elsewhere; AREDS age related eye disease study; ARMD age related macular degeneration; POAG primary open angle glaucoma; EBMD epithelial/anterior basement membrane dystrophy; ACIOL anterior chamber intraocular lens; IOL intraocular lens; PCIOL posterior chamber intraocular lens; Phaco/IOL phacoemulsification with intraocular lens placement; PRK photorefractive keratectomy; LASIK laser assisted in situ keratomileusis; HTN hypertension; DM diabetes mellitus; COPD chronic obstructive pulmonary disease

## 2022-10-13 ENCOUNTER — Encounter (INDEPENDENT_AMBULATORY_CARE_PROVIDER_SITE_OTHER): Payer: Self-pay | Admitting: Ophthalmology

## 2022-10-13 ENCOUNTER — Ambulatory Visit (INDEPENDENT_AMBULATORY_CARE_PROVIDER_SITE_OTHER): Payer: BC Managed Care – PPO | Admitting: Ophthalmology

## 2022-10-13 DIAGNOSIS — I608 Other nontraumatic subarachnoid hemorrhage: Secondary | ICD-10-CM

## 2022-10-13 DIAGNOSIS — H4312 Vitreous hemorrhage, left eye: Secondary | ICD-10-CM

## 2022-10-13 DIAGNOSIS — H3562 Retinal hemorrhage, left eye: Secondary | ICD-10-CM

## 2022-11-23 ENCOUNTER — Other Ambulatory Visit: Payer: Self-pay | Admitting: Neurological Surgery

## 2022-11-23 DIAGNOSIS — K439 Ventral hernia without obstruction or gangrene: Secondary | ICD-10-CM

## 2022-12-28 ENCOUNTER — Ambulatory Visit
Admission: RE | Admit: 2022-12-28 | Discharge: 2022-12-28 | Disposition: A | Discharge status: Home / Self Care | Payer: BC Managed Care – PPO | Source: Ambulatory Visit | Attending: Neurological Surgery | Admitting: Neurological Surgery

## 2022-12-28 DIAGNOSIS — K439 Ventral hernia without obstruction or gangrene: Secondary | ICD-10-CM

## 2023-03-11 ENCOUNTER — Ambulatory Visit: Payer: Self-pay | Admitting: Surgery

## 2023-03-19 ENCOUNTER — Ambulatory Visit: Payer: BC Managed Care – PPO | Admitting: Psychiatry

## 2023-03-19 ENCOUNTER — Encounter: Payer: Self-pay | Admitting: Psychiatry

## 2023-03-19 VITALS — BP 124/79 | HR 105 | Ht 66.0 in | Wt 259.8 lb

## 2023-03-19 DIAGNOSIS — G935 Compression of brain: Secondary | ICD-10-CM | POA: Diagnosis not present

## 2023-03-19 NOTE — Progress Notes (Signed)
   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 On 06/05/22 she was found to be unresponsive. She was found to have a vitreous hemorrhage and papilledema. LP was done which showed opening pressure of 32 cm. She underwent VP shunt placement 06/13/22.   At her last visit she was continued on Zonisamide 200 mg daily.  Interval History: She continues to be headache-free on Zonisamide. She has been tolerating this well without any side effects. Has developed a hernia at her VP shunt site and is undergoing a revision next month. Otherwise she has been doing well with no new issues.   Headache days per month: 0 Headache free days per month: 30  Current Headache Regimen: Preventative: zonisamide 200 mg daily   Prior Therapies                                  Topamax 50 mg daily - parethesias Zonisamide 200 mg daily Nortriptyline 50 mg QHS  Physical Exam:   Vital Signs: BP 124/79 (BP Location: Left Arm, Patient Position: Sitting, Cuff Size: Normal)   Pulse (!) 105   Ht 5\' 6"  (1.676 m)   Wt 259 lb 12.8 oz (117.8 kg)   BMI 41.93 kg/m  GENERAL:  well appearing, in no acute distress, alert  SKIN:  Color, texture, turgor normal. No rashes or lesions HEAD:  Normocephalic/atraumatic. RESP: normal respiratory effort MSK:  No gross joint deformities.   NEUROLOGICAL: Mental Status: Alert, oriented to person, place and time, Follows commands, and Speech fluent and appropriate. Cranial Nerves: PERRL, face symmetric, no dysarthria, hearing grossly intact Motor: moves all extremities equally Gait: normal-based.  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 She continues to be headache-free on Zonisamide. Will continue current regimen for now.  PLAN: -Continue Zonisamide 200 mg daily   Follow-up: 1 year, or sooner if needed  I spent a total of 15 minutes on the date of the service. Headache education was done. Discussed medication side effects, adverse reactions and drug interactions. Written educational materials and patient instructions outlining all of the above were given.  Ocie Doyne, MD 03/19/23 9:37 AM

## 2023-03-29 NOTE — Pre-Procedure Instructions (Signed)
Surgical Instructions    Your procedure is scheduled on Apr 06, 2023.  Report to [facility] Main Entrance "A" at 5:30 A.M., then check in with the Admitting office.  Call this number if you have problems the morning of surgery:  [phone]  If you have any questions prior to your surgery date call [phone]: Open Monday-Friday 8am-4pm If you experience any cold or flu symptoms such as cough, fever, chills, shortness of breath, etc. between now and your scheduled surgery, please notify us at the above number.     Remember:  Do not eat after midnight the night before your surgery  You may drink clear liquids until 4:30 AM the morning of your surgery.   Clear liquids allowed are: Water, Non-Citrus Juices (without pulp), Carbonated Beverages, Clear Tea, Black Coffee Only (NO MILK, CREAM OR POWDERED CREAMER of any kind), and Gatorade.     Take these medicines the morning of surgery with A SIP OF WATER:  zonisamide (ZONEGRAN)   cyclobenzaprine (FLEXERIL) - may take if needed   As of today, STOP taking any Aspirin (unless otherwise instructed by your surgeon) Aleve, Naproxen, Ibuprofen, Motrin, Advil, Goody's, BC's, all herbal medications, fish oil, and all vitamins.                     Do NOT Smoke (Tobacco/Vaping) for 24 hours prior to your procedure.  If you use a CPAP at night, you may bring your mask/headgear for your overnight stay.   Contacts, glasses, piercing's, hearing aid's, dentures or partials may not be worn into surgery, please bring cases for these belongings.    For patients admitted to the hospital, discharge time will be determined by your treatment team.   Patients discharged the day of surgery will not be allowed to drive home, and someone needs to stay with them for 24 hours.  SURGICAL WAITING ROOM VISITATION Patients having surgery or a procedure may have no more than 2 support people in the waiting area - these visitors may rotate.   Children under the age  of 18 must have an adult with them who is not the patient. If the patient needs to stay at the hospital during part of their recovery, the visitor guidelines for inpatient rooms apply. Pre-op nurse will coordinate an appropriate time for 1 support person to accompany patient in pre-op.  This support person may not rotate.   Please refer to the [facility] website for the visitor guidelines for Inpatients (after your surgery is over and you are in a regular room).    Special instructions:   [place]- Preparing For Surgery  Before surgery, you can play an important role. Because skin is not sterile, your skin needs to be as free of germs as possible. You can reduce the number of germs on your skin by washing with CHG (chlorahexidine gluconate) Soap before surgery.  CHG is an antiseptic cleaner which kills germs and bonds with the skin to continue killing germs even after washing.    Oral Hygiene is also important to reduce your risk of infection.  Remember - BRUSH YOUR TEETH THE MORNING OF SURGERY WITH YOUR REGULAR TOOTHPASTE  Please do not use if you have an allergy to CHG or antibacterial soaps. If your skin becomes reddened/irritated stop using the CHG.  Do not shave (including legs and underarms) for at least 48 hours prior to first CHG shower. It is OK to shave your face.  Please follow these instructions carefully.  Shower the NIGHT BEFORE SURGERY and the MORNING OF SURGERY  If you chose to wash your hair, wash your hair first as usual with your normal shampoo.  After you shampoo, rinse your hair and body thoroughly to remove the shampoo.  Use CHG Soap as you would any other liquid soap. You can apply CHG directly to the skin and wash gently with a scrungie or a clean washcloth.   Apply the CHG Soap to your body ONLY FROM THE NECK DOWN.  Do not use on open wounds or open sores. Avoid contact with your eyes, ears, mouth and genitals (private parts). Wash Face and genitals (private  parts)  with your normal soap.   Wash thoroughly, paying special attention to the area where your surgery will be performed.  Thoroughly rinse your body with warm water from the neck down.  DO NOT shower/wash with your normal soap after using and rinsing off the CHG Soap.  Pat yourself dry with a CLEAN TOWEL.  Wear CLEAN PAJAMAS to bed the night before surgery  Place CLEAN SHEETS on your bed the night before your surgery  DO NOT SLEEP WITH PETS.   Day of Surgery: Take a shower with CHG soap. Do not wear jewelry or makeup Do not wear lotions, powders, perfumes/colognes, or deodorant. Do not shave 48 hours prior to surgery.  Men may shave face and neck. Do not bring valuables to the hospital.  [facility] is not responsible for any belongings or valuables. Do not wear nail polish, gel polish, artificial nails, or any other type of covering on natural nails (fingers and toes) If you have artificial nails or gel coating that need to be removed by a nail salon, please have this removed prior to surgery. Artificial nails or gel coating may interfere with anesthesia's ability to adequately monitor your vital signs.  Wear Clean/Comfortable clothing the morning of surgery Remember to brush your teeth WITH YOUR REGULAR TOOTHPASTE.   Please read over the following fact sheets that you were given.    If you received a COVID test during your pre-op visit  it is requested that you wear a mask when out in public, stay away from anyone that may not be feeling well and notify your surgeon if you develop symptoms. If you have been in contact with anyone that has tested positive in the last 10 days please notify you surgeon.

## 2023-03-30 ENCOUNTER — Other Ambulatory Visit: Payer: Self-pay

## 2023-03-30 ENCOUNTER — Encounter (HOSPITAL_COMMUNITY): Payer: Self-pay

## 2023-03-30 ENCOUNTER — Encounter (HOSPITAL_COMMUNITY)
Admission: RE | Admit: 2023-03-30 | Discharge: 2023-03-30 | Disposition: A | Discharge status: Home / Self Care | Payer: BC Managed Care – PPO | Source: Ambulatory Visit | Attending: Surgery | Admitting: Surgery

## 2023-03-30 VITALS — BP 109/76 | HR 102 | Temp 98.4°F | Resp 18 | Ht 66.0 in | Wt 261.8 lb

## 2023-03-30 DIAGNOSIS — Z01818 Encounter for other preprocedural examination: Secondary | ICD-10-CM

## 2023-03-30 DIAGNOSIS — Z01812 Encounter for preprocedural laboratory examination: Secondary | ICD-10-CM | POA: Diagnosis present

## 2023-03-30 HISTORY — DX: Gastro-esophageal reflux disease without esophagitis: K21.9

## 2023-03-30 LAB — CBC
HCT: 37.9 % (ref 36.0–46.0)
Hemoglobin: 12.2 g/dL (ref 12.0–15.0)
MCH: 27.5 pg (ref 26.0–34.0)
MCHC: 32.2 g/dL (ref 30.0–36.0)
MCV: 85.4 fL (ref 80.0–100.0)
Platelets: 234 10*3/uL (ref 150–400)
RBC: 4.44 MIL/uL (ref 3.87–5.11)
RDW: 13.4 % (ref 11.5–15.5)
WBC: 9.5 10*3/uL (ref 4.0–10.5)
nRBC: 0 % (ref 0.0–0.2)

## 2023-03-30 LAB — BASIC METABOLIC PANEL
Anion gap: 6 (ref 5–15)
BUN: 6 mg/dL (ref 6–20)
CO2: 23 mmol/L (ref 22–32)
Calcium: 8.2 mg/dL — ABNORMAL LOW (ref 8.9–10.3)
Chloride: 109 mmol/L (ref 98–111)
Creatinine, Ser: 1.03 mg/dL — ABNORMAL HIGH (ref 0.44–1.00)
GFR, Estimated: >60 mL/min (ref >60)
Glucose, Bld: 139 mg/dL — ABNORMAL HIGH (ref 70–99)
Potassium: 3.3 mmol/L — ABNORMAL LOW (ref 3.5–5.1)
Sodium: 138 mmol/L (ref 135–145)

## 2023-03-30 NOTE — Progress Notes (Signed)
PCP - Haywood Lasso Cardiologist - Denies  PPM/ICD - Denies  Chest x-ray - n/a EKG - n/a Stress Test -  ECHO -  Cardiac Cath -   Sleep Study - Denies CPAP - Denies  Non-diabetic  Blood Thinner Instructions: Denies Aspirin Instructions:Denies  ERAS Protcol - Yes, clear liquids until 0430 PRE-SURGERY Ensure or G2- No  COVID TEST- n/a  Anesthesia review: No  Patient denies shortness of breath, fever, cough and chest pain at PAT appointment   All instructions explained to the patient, with a verbal understanding of the material. Patient agrees to go over the instructions while at home for a better understanding. Patient also instructed to self quarantine after being tested for COVID-19. The opportunity to ask questions was provided.

## 2023-04-05 NOTE — Anesthesia Preprocedure Evaluation (Addendum)
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  Reproductive/Obstetrics                              Anesthesia Physical Anesthesia Plan  ASA: 2  Anesthesia Plan: General   Post-op Pain Management: Tylenol PO (pre-op)* and Toradol IV (intra-op)*   Induction: Intravenous  PONV Risk Score and Plan: 3 and Midazolam, Treatment may vary due to age or medical condition, Dexamethasone, Ondansetron and Scopolamine patch - Pre-op  Airway Management Planned: Oral ETT  Additional Equipment: None  Intra-op Plan:   Post-operative Plan: Extubation in OR  Informed Consent: I have reviewed the patients History and Physical, chart, labs and discussed the procedure including the risks, benefits and alternatives for the proposed anesthesia with the patient or authorized representative who has indicated his/her understanding and acceptance.     Dental advisory given  Plan Discussed with: CRNA  Anesthesia Plan Comments:         Anesthesia  Quick Evaluation

## 2023-04-06 ENCOUNTER — Other Ambulatory Visit: Payer: Self-pay

## 2023-04-06 ENCOUNTER — Ambulatory Visit (HOSPITAL_COMMUNITY): Payer: BC Managed Care – PPO | Admitting: Anesthesiology

## 2023-04-06 ENCOUNTER — Ambulatory Visit (HOSPITAL_COMMUNITY)
Admission: RE | Admit: 2023-04-06 | Discharge: 2023-04-06 | Disposition: A | Discharge status: Home / Self Care | Payer: BC Managed Care – PPO | Attending: Surgery | Admitting: Surgery

## 2023-04-06 ENCOUNTER — Encounter (HOSPITAL_COMMUNITY): Payer: Self-pay | Admitting: Surgery

## 2023-04-06 ENCOUNTER — Encounter (HOSPITAL_COMMUNITY)
Admission: RE | Disposition: A | Discharge status: Home / Self Care | Payer: Self-pay | Source: Home / Self Care | Attending: Surgery

## 2023-04-06 DIAGNOSIS — K219 Gastro-esophageal reflux disease without esophagitis: Secondary | ICD-10-CM | POA: Diagnosis not present

## 2023-04-06 DIAGNOSIS — Z87891 Personal history of nicotine dependence: Secondary | ICD-10-CM | POA: Diagnosis not present

## 2023-04-06 DIAGNOSIS — F32A Depression, unspecified: Secondary | ICD-10-CM | POA: Diagnosis not present

## 2023-04-06 DIAGNOSIS — Z09 Encounter for follow-up examination after completed treatment for conditions other than malignant neoplasm: Secondary | ICD-10-CM | POA: Insufficient documentation

## 2023-04-06 DIAGNOSIS — F419 Anxiety disorder, unspecified: Secondary | ICD-10-CM | POA: Insufficient documentation

## 2023-04-06 DIAGNOSIS — K432 Incisional hernia without obstruction or gangrene: Secondary | ICD-10-CM | POA: Diagnosis not present

## 2023-04-06 DIAGNOSIS — Z6841 Body Mass Index (BMI) 40.0 and over, adult: Secondary | ICD-10-CM | POA: Insufficient documentation

## 2023-04-06 DIAGNOSIS — Z982 Presence of cerebrospinal fluid drainage device: Secondary | ICD-10-CM | POA: Diagnosis not present

## 2023-04-06 HISTORY — PX: INCISIONAL HERNIA REPAIR: SHX193

## 2023-04-06 HISTORY — PX: LAPAROSCOPIC REVISION VENTRICULAR-PERITONEAL (V-P) SHUNT: SHX5924

## 2023-04-06 LAB — POCT PREGNANCY, URINE: Preg Test, Ur: NEGATIVE

## 2023-04-06 SURGERY — REVISION, PROCEDURE INVOLVING VENTRICULOPERITONEAL SHUNT, LAPAROSCOPIC
Anesthesia: General | Site: Abdomen

## 2023-04-06 MED ORDER — KETOROLAC TROMETHAMINE 30 MG/ML IJ SOLN
INTRAMUSCULAR | Status: DC | PRN
  Administered 2023-04-06: 30 mg via INTRAVENOUS

## 2023-04-06 MED ORDER — CHLORHEXIDINE GLUCONATE CLOTH 2 % EX PADS: 6.0000 | MEDICATED_PAD | Freq: Once | CUTANEOUS | Status: DC

## 2023-04-06 MED ORDER — SCOPOLAMINE 1 MG/3DAYS TD PT72
1.0000 | MEDICATED_PATCH | Freq: Once | TRANSDERMAL | Status: DC
  Administered 2023-04-06: 1.5 mg via TRANSDERMAL
  Filled 2023-04-06: qty 1

## 2023-04-06 MED ORDER — BUPIVACAINE LIPOSOME 1.3 % IJ SUSP: 20.0000 mL | Freq: Once | INTRAMUSCULAR | Status: DC

## 2023-04-06 MED ORDER — FENTANYL CITRATE (PF) 100 MCG/2ML IJ SOLN
INTRAMUSCULAR | Status: AC
  Filled 2023-04-06: qty 2

## 2023-04-06 MED ORDER — LIDOCAINE 2% (20 MG/ML) 5 ML SYRINGE
INTRAMUSCULAR | Status: DC | PRN
  Administered 2023-04-06: 100 mg via INTRAVENOUS

## 2023-04-06 MED ORDER — OXYCODONE-ACETAMINOPHEN 5-325 MG PO TABS: 1.0000 | ORAL_TABLET | ORAL | 0 refills | Status: DC | PRN

## 2023-04-06 MED ORDER — OXYCODONE HCL 5 MG/5ML PO SOLN
5.0000 mg | Freq: Once | ORAL | Status: AC | PRN
  Administered 2023-04-06: 5 mg via ORAL

## 2023-04-06 MED ORDER — DEXAMETHASONE SODIUM PHOSPHATE 10 MG/ML IJ SOLN
INTRAMUSCULAR | Status: DC | PRN
  Administered 2023-04-06: 10 mg via INTRAVENOUS

## 2023-04-06 MED ORDER — BUPIVACAINE HCL 0.25 % IJ SOLN
INTRAMUSCULAR | Status: DC | PRN
  Administered 2023-04-06: 30 mL

## 2023-04-06 MED ORDER — ACETAMINOPHEN 500 MG PO TABS
1000.0000 mg | ORAL_TABLET | ORAL | Status: AC
  Administered 2023-04-06: 1000 mg via ORAL

## 2023-04-06 MED ORDER — OXYCODONE HCL 5 MG PO TABS: 5.0000 mg | ORAL_TABLET | Freq: Once | ORAL | Status: AC | PRN

## 2023-04-06 MED ORDER — MIDAZOLAM HCL 2 MG/2ML IJ SOLN
INTRAMUSCULAR | Status: AC
  Filled 2023-04-06: qty 2

## 2023-04-06 MED ORDER — FENTANYL CITRATE (PF) 250 MCG/5ML IJ SOLN
INTRAMUSCULAR | Status: AC
  Filled 2023-04-06: qty 5

## 2023-04-06 MED ORDER — LACTATED RINGERS IV SOLN: INTRAVENOUS | Status: DC

## 2023-04-06 MED ORDER — CEFAZOLIN SODIUM-DEXTROSE 2-4 GM/100ML-% IV SOLN
2.0000 g | INTRAVENOUS | Status: DC
  Filled 2023-04-06: qty 100

## 2023-04-06 MED ORDER — BUPIVACAINE-EPINEPHRINE (PF) 0.5% -1:200000 IJ SOLN
INTRAMUSCULAR | Status: AC
  Filled 2023-04-06: qty 30

## 2023-04-06 MED ORDER — ORAL CARE MOUTH RINSE: 15.0000 mL | Freq: Once | OROMUCOSAL | Status: AC

## 2023-04-06 MED ORDER — ROCURONIUM BROMIDE 10 MG/ML (PF) SYRINGE
PREFILLED_SYRINGE | INTRAVENOUS | Status: DC | PRN
  Administered 2023-04-06: 70 mg via INTRAVENOUS
  Administered 2023-04-06: 10 mg via INTRAVENOUS

## 2023-04-06 MED ORDER — FENTANYL CITRATE (PF) 250 MCG/5ML IJ SOLN
INTRAMUSCULAR | Status: DC | PRN
  Administered 2023-04-06 (×2): 50 ug via INTRAVENOUS

## 2023-04-06 MED ORDER — MIDAZOLAM HCL 2 MG/2ML IJ SOLN
INTRAMUSCULAR | Status: DC | PRN
  Administered 2023-04-06: 2 mg via INTRAVENOUS

## 2023-04-06 MED ORDER — GABAPENTIN 300 MG PO CAPS
300.0000 mg | ORAL_CAPSULE | ORAL | Status: AC
  Administered 2023-04-06: 300 mg via ORAL
  Filled 2023-04-06: qty 1

## 2023-04-06 MED ORDER — BUPIVACAINE HCL (PF) 0.25 % IJ SOLN
INTRAMUSCULAR | Status: AC
  Filled 2023-04-06: qty 60

## 2023-04-06 MED ORDER — 0.9 % SODIUM CHLORIDE (POUR BTL) OPTIME
TOPICAL | Status: DC | PRN
  Administered 2023-04-06: 1000 mL

## 2023-04-06 MED ORDER — METHOCARBAMOL 750 MG PO TABS: 750.0000 mg | ORAL_TABLET | Freq: Four times a day (QID) | ORAL | 0 refills | Status: DC | PRN

## 2023-04-06 MED ORDER — ACETAMINOPHEN 500 MG PO TABS
1000.0000 mg | ORAL_TABLET | Freq: Once | ORAL | Status: AC
  Filled 2023-04-06: qty 2

## 2023-04-06 MED ORDER — PROPOFOL 10 MG/ML IV BOLUS
INTRAVENOUS | Status: DC | PRN
  Administered 2023-04-06: 200 mg via INTRAVENOUS
  Administered 2023-04-06: 10 mg via INTRAVENOUS

## 2023-04-06 MED ORDER — AMISULPRIDE (ANTIEMETIC) 5 MG/2ML IV SOLN: 10.0000 mg | Freq: Once | INTRAVENOUS | Status: DC | PRN

## 2023-04-06 MED ORDER — SUGAMMADEX SODIUM 200 MG/2ML IV SOLN
INTRAVENOUS | Status: DC | PRN
  Administered 2023-04-06: 240 mg via INTRAVENOUS

## 2023-04-06 MED ORDER — CHLORHEXIDINE GLUCONATE 0.12 % MT SOLN
15.0000 mL | Freq: Once | OROMUCOSAL | Status: AC
  Administered 2023-04-06: 15 mL via OROMUCOSAL
  Filled 2023-04-06: qty 15

## 2023-04-06 MED ORDER — OXYCODONE HCL 5 MG/5ML PO SOLN
ORAL | Status: AC
  Filled 2023-04-06: qty 5

## 2023-04-06 MED ORDER — FENTANYL CITRATE (PF) 100 MCG/2ML IJ SOLN
25.0000 ug | INTRAMUSCULAR | Status: DC | PRN
  Administered 2023-04-06 (×2): 50 ug via INTRAVENOUS

## 2023-04-06 MED ORDER — PROPOFOL 10 MG/ML IV BOLUS
INTRAVENOUS | Status: AC
  Filled 2023-04-06: qty 40

## 2023-04-06 MED ORDER — ONDANSETRON HCL 4 MG/2ML IJ SOLN
INTRAMUSCULAR | Status: DC | PRN
  Administered 2023-04-06: 4 mg via INTRAVENOUS

## 2023-04-06 SURGICAL SUPPLY — 62 items
ADH SKN CLS APL DERMABOND .7 (GAUZE/BANDAGES/DRESSINGS) ×2
APL PRP STRL LF DISP 70% ISPRP (MISCELLANEOUS) ×2
BAG COUNTER SPONGE SURGICOUNT (BAG) ×2 IMPLANT
BAG SPNG CNTER NS LX DISP (BAG) ×2
BLADE CLIPPER SURG (BLADE) IMPLANT
CANISTER SUCT 3000ML PPV (MISCELLANEOUS) ×2 IMPLANT
CHLORAPREP W/TINT 26 (MISCELLANEOUS) ×2 IMPLANT
COVER SURGICAL LIGHT HANDLE (MISCELLANEOUS) ×2 IMPLANT
COVER ULTRASOUND PROBE 36 ST (MISCELLANEOUS) IMPLANT
DERMABOND ADVANCED .7 DNX12 (GAUZE/BANDAGES/DRESSINGS) ×2 IMPLANT
DRAPE LAPAROSCOPIC ABDOMINAL (DRAPES) ×2 IMPLANT
DRAPE WARM FLUID 44X44 (DRAPES) ×2 IMPLANT
ELECT CAUTERY BLADE 6.4 (BLADE) ×2 IMPLANT
ELECT REM PT RETURN 9FT ADLT (ELECTROSURGICAL) ×2
ELECTRODE REM PT RTRN 9FT ADLT (ELECTROSURGICAL) ×2 IMPLANT
GAUZE 4X4 16PLY ~~LOC~~+RFID DBL (SPONGE) IMPLANT
GAUZE SPONGE 4X4 12PLY STRL (GAUZE/BANDAGES/DRESSINGS) IMPLANT
GLOVE BIO SURGEON STRL SZ7.5 (GLOVE) ×2 IMPLANT
GLOVE BIOGEL M STRL SZ7.5 (GLOVE) ×2 IMPLANT
GLOVE BIOGEL PI IND STRL 8 (GLOVE) ×2 IMPLANT
GLOVE INDICATOR 8.0 STRL GRN (GLOVE) ×4 IMPLANT
GLOVE SRG 8 PF TXTR STRL LF DI (GLOVE) ×2 IMPLANT
GLOVE SURG ENC MOIS LTX SZ7.5 (GLOVE) ×2 IMPLANT
GLOVE SURG UNDER POLY LF SZ8 (GLOVE) ×2
GOWN STRL REUS W/ TWL LRG LVL3 (GOWN DISPOSABLE) ×4 IMPLANT
GOWN STRL REUS W/ TWL XL LVL3 (GOWN DISPOSABLE) ×2 IMPLANT
GOWN STRL REUS W/TWL 2XL LVL3 (GOWN DISPOSABLE) ×2 IMPLANT
GOWN STRL REUS W/TWL LRG LVL3 (GOWN DISPOSABLE)
GOWN STRL REUS W/TWL XL LVL3 (GOWN DISPOSABLE) ×4
INTRODUCER COOK 11FR (CATHETERS) IMPLANT
IRRIG SUCT STRYKERFLOW 2 WTIP (MISCELLANEOUS)
IRRIGATION SUCT STRKRFLW 2 WTP (MISCELLANEOUS) IMPLANT
KIT BASIN OR (CUSTOM PROCEDURE TRAY) ×2 IMPLANT
KIT TURNOVER KIT B (KITS) ×2 IMPLANT
NDL 22X1.5 STRL (OR ONLY) (MISCELLANEOUS) ×2 IMPLANT
NDL INSUFFLATION 14GA 120MM (NEEDLE) ×2 IMPLANT
NEEDLE 22X1.5 STRL (OR ONLY) (MISCELLANEOUS) ×2 IMPLANT
NEEDLE INSUFFLATION 14GA 120MM (NEEDLE) ×2 IMPLANT
NS IRRIG 1000ML POUR BTL (IV SOLUTION) ×2 IMPLANT
PACK GENERAL/GYN (CUSTOM PROCEDURE TRAY) ×2 IMPLANT
PAD ARMBOARD 7.5X6 YLW CONV (MISCELLANEOUS) ×4 IMPLANT
PENCIL SMOKE EVACUATOR (MISCELLANEOUS) ×2 IMPLANT
SCISSORS LAP 5X35 DISP (ENDOMECHANICALS) IMPLANT
SET TUBE SMOKE EVAC HIGH FLOW (TUBING) ×2 IMPLANT
SLEEVE Z-THREAD 5X100MM (TROCAR) ×2 IMPLANT
STAPLER VISISTAT 35W (STAPLE) IMPLANT
SUT ETHILON 2 0 PS N (SUTURE) IMPLANT
SUT MNCRL AB 4-0 PS2 18 (SUTURE) ×2 IMPLANT
SUT NOVA 0 T19/GS 22DT (SUTURE) IMPLANT
SUT PDS AB 2-0 CT2 27 (SUTURE) ×8 IMPLANT
SUT STRATAFIX 1PDS 45CM VIOLET (SUTURE) IMPLANT
SUT VIC AB 3-0 SH 8-18 (SUTURE) ×2 IMPLANT
SYR CONTROL 10ML LL (SYRINGE) ×2 IMPLANT
TOWEL GREEN STERILE (TOWEL DISPOSABLE) ×2 IMPLANT
TOWEL GREEN STERILE FF (TOWEL DISPOSABLE) ×2 IMPLANT
TRAY FOLEY MTR SLVR 14FR STAT (SET/KITS/TRAYS/PACK) IMPLANT
TRAY LAPAROSCOPIC MC (CUSTOM PROCEDURE TRAY) ×2 IMPLANT
TROCAR 11X100 Z THREAD (TROCAR) IMPLANT
TROCAR BALLN 12MMX100 BLUNT (TROCAR) IMPLANT
TROCAR XCEL NON-BLD 5MMX100MML (ENDOMECHANICALS) ×2 IMPLANT
TROCAR Z THREAD OPTICAL 12X100 (TROCAR) IMPLANT
WARMER LAPAROSCOPE (MISCELLANEOUS) ×2 IMPLANT

## 2023-04-06 NOTE — Anesthesia Postprocedure Evaluation (Signed)
Anesthesia Post Note  Patient: Sabrina Diaz  Procedure(s) Performed: LAPAROSCOPIC REVISION VENTRICULAR-PERITONEAL (V-P) SHUNT (Abdomen) OPEN HERNIA REPAIR INCISIONAL REPAIR (Left: Abdomen)     Patient location during evaluation: PACU Anesthesia Type: General Level of consciousness: awake and alert Pain management: pain level controlled Vital Signs Assessment: post-procedure vital signs reviewed and stable Respiratory status: spontaneous breathing, nonlabored ventilation and respiratory function stable Cardiovascular status: blood pressure returned to baseline Postop Assessment: no apparent nausea or vomiting Anesthetic complications: no   No notable events documented.  Last Vitals:  Vitals:   04/06/23 0945 04/06/23 1000  BP: (!) 131/97 126/89  Pulse: 60 (!) 56  Resp: 14 15  Temp:  36.9 C  SpO2: 92% 94%    Last Pain:  Vitals:   04/06/23 1000  TempSrc:   PainSc: 4                  Shanda Howells

## 2023-04-06 NOTE — Discharge Instructions (Signed)
 POST OPERATIVE INSTRUCTIONS  Thinking Clearly  The anesthesia may cause you to feel different for 1 or 2 days. Do not drive, drink alcohol, or make any big decisions for at least 2 days.  Nutrition When you wake up, you will be able to drink small amounts of liquid. If you do not feel sick, you can slowly advance your diet to regular foods. Continue to drink lots of fluids, usually about 8 to 10 glasses per day. Eat a high-fiber diet so you don't strain during bowel movements. High-Fiber Foods Foods high in fiber include beans, bran cereals and whole-grain breads, peas, dried fruit (figs, apricots, and dates), raspberries, blackberries, strawberries, sweet corn, broccoli, baked potatoes with skin, plums, pears, apples, greens, and nuts. Activity Slowly increase your activity. Be sure to get up and walk every hour or so to prevent blood clots. No heavy lifting or strenuous activity for 4 weeks following surgery to prevent hernias at your incision sites It is normal to feel tired. You may need more sleep than usual.  Get your rest but make sure to get up and move around frequently to prevent blood clots and pneumonia.  Work and Return to School You can go back to work when you feel well enough. Discuss the timing with your surgeon. You can usually go back to school or work 1 week or less after an operation for an unruptured appendix and up to 2 weeks after a ruptured appendix. If your work requires heavy lifting or strenuous activity you need to be placed on light duty for 4 weeks following surgery. You can return to gym class, sports or other physical activities 4 weeks after surgery.  Wound Care Always wash your hands before and after touching near your incision site. Do not soak in a bathtub until cleared at your follow up appointment. You may take a shower 24 hours after surgery. A small amount of drainage from the incision is normal. If the drainage is thick and yellow or the site is  red, you may have an infection, so call your surgeon. If you have a drain in one of your incisions, it will be taken out in office when the drainage stops. Steri-Strips will fall off in 7 to 10 days or they will be removed during your first office visit. If you have dermabond glue covering over the incision, allow the glue to flake off on its own. Avoid wearing tight or rough clothing. It may rub your incisions and make it harder for them to heal. Protect the new skin, especially from the sun. The sun can burn and cause darker scarring. Your scar will heal in about 4 to 6 weeks and will become softer and continue to fade over the next year.  The cosmetic appearance of the incisions will improve over the course of the first year after surgery. Sensation around your incision will return in a few weeks or months.  Bowel Movements After intestinal surgery, you may have loose watery stools for several days. If watery diarrhea lasts longer than 3 days, contact your surgeon. Pain medication (narcotics) can cause constipation. Increase the fiber in your diet with high-fiber foods if you are constipated. You can take an over the counter stool softener like Colace to avoid constipation.  Additional over the counter medications can also be used if Colace isn't sufficient (for example, Milk of Magnesia or Miralax).  Pain The amount of pain is different for each person. Some people need only 1 to 3   doses of pain control medication, while others need more. Take alternating doses of tylenol and ibuprofen around the clock for the first five days following surgery.  This will provide a baseline of pain control and help with inflammation.  Take the narcotic pain medication in addition if needed for severe pain.  Contact Your Surgeon at [phone], if you have: Pain that will not go away Pain that gets worse A fever of more than 101F (38.3C) Repeated vomiting Swelling, redness, bleeding, or bad-smelling  drainage from your wound site Strong abdominal pain No bowel movement or unable to pass gas for 3 days Watery diarrhea lasting longer than 3 days  Pain Control The goal of pain control is to minimize pain, keep you moving and help you heal. Your surgical team will work with you on your pain plan. Most often a combination of therapies and medications are used to control your pain. You may also be given medication (local anesthetic) at the surgical site. This may help control your pain for several days. Extreme pain puts extra stress on your body at a time when your body needs to focus on healing. Do not wait until your pain has reached a level "10" or is unbearable before telling your doctor or nurse. It is much easier to control pain before it becomes severe. Following a laparoscopic procedure, pain is sometimes felt in the shoulder. This is due to the gas inserted into your abdomen during the procedure. Moving and walking helps to decrease the gas and the right shoulder pain.  Use the guide below for ways to manage your post-operative pain. Learn more by going to facs.org/safepaincontrol.  How Intense Is My Pain Common Therapies to Feel Better       I hardly notice my pain, and it does not interfere with my activities.  I notice my pain and it distracts me, but I can still do activities (sitting up, walking, standing).  Non-Medication Therapies  Ice (in a bag, applied over clothing at the surgical site), elevation, rest, meditation, massage, distraction (music, TV, play) walking and mild exercise Splinting the abdomen with pillows +  Non-Opioid Medications Acetaminophen (Tylenol) Non-steroidal anti-inflammatory drugs (NSAIDS) Aspirin, Ibuprofen (Motrin, Advil) Naproxen (Aleve) Take these as needed, when you feel pain. Both acetaminophen and NSAIDs help to decrease pain and swelling (inflammation).      My pain is hard to ignore and is more noticeable even when I rest.  My  pain interferes with my usual activities.  Non-Medication Therapies  +  Non-Opioid medications  Take on a regular schedule (around-the-clock) instead of as needed. (For example, Tylenol every 6 hours at 9:00 am, 3:00 pm, 9:00 pm, 3:00 am and Motrin every 6 hours at 12:00 am, 6:00 am, 12:00 pm, 6:00 pm)         I am focused on my pain, and I am not doing my daily activities.  I am groaning in pain, and I cannot sleep. I am unable to do anything.  My pain is as bad as it could be, and nothing else matters.  Non-Medication Therapies  +  Around-the-Clock Non-Opioid Medications  +  Short-acting opioids  Opioids should be used with other medications to manage severe pain. Opioids block pain and give a feeling of euphoria (feel high). Addiction, a serious side effect of opioids, is rare with short-term (a few days) use.  Examples of short-acting opioids include: Tramadol (Ultram), Hydrocodone (Norco, Vicodin), Hydromorphone (Dilaudid), Oxycodone (Oxycontin)     The above directions   have been adapted from the [employer] Surgical Patient Education Program.  Please refer to the ACS website if needed: https://www.facs.org/education/patient-education/patient-resources/operations/appendectomy.   Lucifer Soja, MD [facility] [address], [address], [place], [place]  [address] ?  [address] [address], , [place]   [address] [phone] ? [phone] ? FAX [phone] Web site: [email or web address]  

## 2023-04-06 NOTE — Anesthesia Procedure Notes (Signed)
Procedure Name: Intubation Date/Time: 04/06/2023 7:46 AM  Performed by: Jodell Cipro, CRNAPre-anesthesia Checklist: Patient identified, Emergency Drugs available, Suction available and Patient being monitored Patient Re-evaluated:Patient Re-evaluated prior to induction Oxygen Delivery Method: Circle System Utilized Preoxygenation: Pre-oxygenation with 100% oxygen Induction Type: IV induction Ventilation: Mask ventilation without difficulty Laryngoscope Size: Mac and 3 Grade View: Grade I Tube type: Oral Tube size: 7.0 mm Number of attempts: 1 Airway Equipment and Method: Stylet and Oral airway Placement Confirmation: ETT inserted through vocal cords under direct vision, positive ETCO2 and breath sounds checked- equal and bilateral Secured at: 21 cm Tube secured with: Tape Dental Injury: Teeth and Oropharynx as per pre-operative assessment

## 2023-04-06 NOTE — H&P (Signed)
Admitting Physician: Hyman Hopes Leannah Guse  Service: General surgery  CC: hernia involving VP shunt  Subjective   HPI: Sabrina Diaz is an 27 y.o. female who is here for VP Shunt revision and hernia repair  Sabrina Diaz has a history of Chiari malformation and now hydrocephalus with VP shunt. She has developed a hernia at her VP shunt insertion site. The catheter is wound in the hernia and then the tip is inside the abdomen. The hernia has become progressively more symptomatic to the point where she has given it the name, "Gloris Ham." It hurts especially when she bends over to tie her shoes and she would like it repaired.   Past Medical History:  Diagnosis Date   Anxiety    Chiari malformation type I (HCC)    Chronic headaches    Depression    GERD (gastroesophageal reflux disease)     Past Surgical History:  Procedure Laterality Date   APPLICATION OF CRANIAL NAVIGATION  06/13/2022   Procedure: APPLICATION OF CRANIAL NAVIGATION;  Surgeon: Jadene Pierini, MD;  [facility]: [facility];  Service: Neurosurgery;;   PARS PLANA VITRECTOMY Left 08/17/2022   Procedure: TWENTY-FIVE GAUGE PARS PLANA VITRECTOMY;  Surgeon: Rennis Chris, MD;  Location: [facility] OR;  Service: Ophthalmology;  Laterality: Left;   SHUNT REMOVAL N/A 06/13/2022   Procedure: Lumboperitoneal Shunt Removal;  Surgeon: Jadene Pierini, MD;  Location: [facility] OR;  Service: Neurosurgery;  Laterality: N/A;   SUBOCCIPITAL CRANIECTOMY CERVICAL LAMINECTOMY N/A 04/03/2022   Procedure: Chiari decompression;  Surgeon: Jadene Pierini, MD;  [facility]: [facility];  Service: Neurosurgery;  Laterality: N/A;  RM 21   VENTRICULOPERITONEAL SHUNT N/A 04/29/2022   Procedure: LUMBOPERITONEAL SHUNT PLACEMENT;  Surgeon: Jadene Pierini, MD;  [facility]: [facility];  Service: Neurosurgery;  Laterality: N/A;   VENTRICULOPERITONEAL SHUNT Right 06/13/2022   Procedure: Right Ventriculoperitoneal Shunt Placement;  Surgeon: Jadene Pierini, MD;  Location: [facility] OR;   Service: Neurosurgery;  Laterality: Right;    History reviewed. No pertinent family history.  Social:  reports that she quit smoking about 21 months ago. Her smoking use included cigarettes. She has never used smokeless tobacco. She reports current alcohol use. She reports that she does not currently use drugs after having used the following drugs: Marijuana.  Allergies: No Known Allergies  Medications: Current Outpatient Medications  Medication Instructions   bacitracin-polymyxin b (POLYSPORIN) ophthalmic ointment Left Eye, 4 times daily, Place a 1/2 inch ribbon of ointment into the lower eyelid.   Calcium Carbonate Antacid (TUMS PO) 2 tablets, Oral, 2 times daily PRN   cyclobenzaprine (FLEXERIL) 10 mg, Oral, 3 times daily PRN   Multiple Vitamin (MULTIVITAMIN WITH MINERALS) TABS tablet 1 tablet, Oral, Daily   ondansetron (ZOFRAN-ODT) 4 mg, Oral, Every 12 hours PRN   prednisoLONE acetate (PRED FORTE) 1 % ophthalmic suspension 1 drop, Left Eye, 4 times daily   traZODone (DESYREL) 100 mg, Oral, Daily at bedtime   zonisamide (ZONEGRAN) 200 mg, Oral, Daily, Take 100 mg (1 pill) daily for two weeks, then increase to 200 mg (2 pills) daily.    ROS - all of the below systems have been reviewed with the patient and positives are indicated with bold text General: chills, fever or night sweats Eyes: blurry vision or double vision ENT: epistaxis or sore throat Allergy/Immunology: itchy/watery eyes or nasal congestion Hematologic/Lymphatic: bleeding problems, blood clots or swollen lymph nodes Endocrine: temperature intolerance or unexpected weight changes Breast: new or changing breast lumps or nipple discharge Resp: cough,  shortness of breath, or wheezing CV: chest pain or dyspnea on exertion GI: as per HPI GU: dysuria, trouble voiding, or hematuria MSK: joint pain or joint stiffness Neuro: TIA or stroke symptoms Derm: pruritus and skin lesion changes Psych: anxiety and  depression  Objective   PE Blood pressure 116/79, pulse 88, temperature 98.7 F (37.1 C), temperature source Oral, resp. rate 18, height 5\' 6"  (1.676 m), weight 118.4 kg, last menstrual period 03/28/2023, SpO2 96 %. Constitutional: NAD; conversant; no deformities Eyes: Moist conjunctiva; no lid lag; anicteric; PERRL Neck: Trachea midline; no thyromegaly Lungs: Normal respiratory effort; no tactile fremitus CV: RRR; no palpable thrills; no pitting edema GI: Abd Soft, nontender, incisoinal hernia; no palpable hepatosplenomegaly MSK: Normal range of motion of extremities; no clubbing/cyanosis Psychiatric: Appropriate affect; alert and oriented x3 Lymphatic: No palpable cervical or axillary lymphadenopathy  No results found for this or any previous visit (from the past 24 hour(s)).  Imaging Orders  No imaging studies ordered today     Assessment and Plan   SabrinaAsquith is a 27 year old female with a incisional hernia at VP insertion site.  The hernia defect measures 3.1 cm wide by 3.7 cm tall on CT scan.  I recommended laparoscopic revision of the VP shunt with open incisional hernia repair. I described the procedure itself. I explained I would identify the catheter higher on the abdominal wall using ultrasound and pulled the tail of the catheter out of the abdomen. I would reinsert it through a new site higher in the abdomen using laparoscopic guidance and a introducer. I then would repair the incisional hernia in an open fashion closing the defect primarily. I would like to avoid placing the mesh if possible due to the presence of the VP shunt and possible need for future VP shunt revisions. I explained this increases her risk of hernia recurrence, however if the hernia recurs we could fix it without needing to relocate the VP shunt at the same time. We discussed the surgery itself as well as its risk, benefits, and alternatives. After full discussion all questions answered the patient granted  consent to proceed. Our surgery scheduler will reach out to the patient to schedule surgery. I will ask to be scheduled at [facility] where neurosurgery support would be available if needed, however I feel neurosurgery does not necessarily have to be present for the operation.  Quentin Ore, MD  [facility] Use [email or web address] to contact on call provider

## 2023-04-06 NOTE — Transfer of Care (Signed)
Immediate Anesthesia Transfer of Care Note  Patient: Sabrina Diaz  Procedure(s) Performed: LAPAROSCOPIC REVISION VENTRICULAR-PERITONEAL (V-P) SHUNT (Abdomen) OPEN HERNIA REPAIR INCISIONAL REPAIR (Left: Abdomen)  Patient Location: PACU  Anesthesia Type:General  Level of Consciousness: awake, alert , and oriented  Airway & Oxygen Therapy: Patient Spontanous Breathing and Patient connected to nasal cannula oxygen  Post-op Assessment: Report given to RN and Post -op Vital signs reviewed and stable  Post vital signs: Reviewed and stable  Last Vitals:  Vitals Value Taken Time  BP 130/91 04/06/23 0918  Temp    Pulse 64 04/06/23 0921  Resp 19 04/06/23 0921  SpO2 96 % 04/06/23 0921  Vitals shown include unvalidated device data.  Last Pain:  Vitals:   04/06/23 0616  TempSrc:   PainSc: 0-No pain         Complications: No notable events documented.

## 2023-04-06 NOTE — Op Note (Signed)
Patient: Sabrina Diaz ([birth year], [id number])  Date of Surgery: 04/06/2023   Preoperative Diagnosis:  INCISIONAL HERNIA INVOLVING VP SHUNT (3.1 cm wide by 3.7 cm tall henia defect)  Postoperative Diagnosis:  INCISIONAL HERNIA INVOLVING VP SHUNT (3.1 cm wide by 3.7 cm tall henia defect)  Surgical Procedure: LAPAROSCOPIC REVISION VENTRICULAR-PERITONEAL (V-P) SHUNT OPEN HERNIA REPAIR INCISIONAL REPAIR  Operative Team Members:  Surgeon(s) and Role:    * Joceline Hinchcliff, Hyman Hopes, MD - Primary   Anesthesiologist: Kaylyn Layer, MD CRNA: Jodell Cipro, CRNA; Maxine Glenn, CRNA   Anesthesia: General   Fluids:  No intake/output data recorded.  Complications: None  Drains:  No external drains, VP shunt repositioned over the dome of the right lobe of the liver   Specimen: None  Disposition:  PACU - hemodynamically stable.  Plan of Care: Discharge to home after PACU    Indications for Procedure:  Sabrina Diaz is a 27 year old female with a incisional hernia at VP insertion site.  The hernia defect measures 3.1 cm wide by 3.7 cm tall on CT scan.  I recommended laparoscopic revision of the VP shunt with open incisional hernia repair. I described the procedure itself. I explained I would identify the catheter higher on the abdominal wall using ultrasound and pulled the tail of the catheter out of the abdomen. I would reinsert it through a new site higher in the abdomen using laparoscopic guidance and a introducer. I then would repair the incisional hernia in an open fashion closing the defect primarily. I would like to avoid placing the mesh if possible due to the presence of the VP shunt and possible need for future VP shunt revisions. I explained this increases her risk of hernia recurrence, however if the hernia recurs we could fix it without needing to relocate the VP shunt at the same time. We discussed the surgery itself as well as its risk, benefits, and alternatives. After full  discussion all questions answered the patient granted consent to proceed. Our surgery scheduler will reach out to the patient to schedule surgery. I will ask to be scheduled at [facility] where neurosurgery support would be available if needed, however I feel neurosurgery does not necessarily have to be present for the operation.     Findings: VP shunt herniated into incisional hernia.  VP shunt broken prior to surgery at some point.  Extra/disconnected shunt tubing was removed, pictured below.  Shunt redirected to the RUQ terminating over the dome of the right lobe of the liver.  Incisional hernia fixed with 4 figure of eight 0-novafil sutures        Description of Procedure:   On the date stated above the patient was taken the operating room suite and placed in supine position.  General endotracheal anesthesia was induced.  A timeout was completed verifying the correct patient, procedure, position, and equipment needed for the case.  Antibiotics were given prior to the case start.  I placed a 5 mm trocar in the left upper quadrant using an optical technique and inflated the abdomen to 15 mmHg.  The abdomen was inspected.  There is no trauma to the underlying viscera with initial trocar placement.  Additional 5 mm trocar was placed in the right lower quadrant.  The incisional hernia was inspected and it contained omentum and the shunt tubing.  I then used ultrasound to identify the shunt in the epigastric region near the midline.  A small incision was made over the shunt  and the shunt was dissected free of the surrounding subcutaneous tissues using blunt dissection.  I pulled the shunt out of the abdomen through the small incision and then used the breakaway sheath to redirect it through the abdominal wall musculature into the right upper quadrant to terminate over the dome of the right lobe of the liver.  With the shunt in proper position and away from the hernia I then directed my  attention to hernia repair.  After repositioning the shunt, there was still shunt material in the hernia defect.  This was removed and pictured above.  We examined the tips of the remaining shunt material it appears the tubing had broken at some point, possibly related to the tubing being stretched while the patient developed a hernia.  Thankfully, there was enough remaining tubing that the catheter still reached comfortably into the abdomen.  Incision was made over the incisional hernia and the hernia contents were dissected free from the hernia sac.  There was some bleeding omentum that was controlled using electrocautery.  The hernia contents were then returned to the abdomen and the fascial edges were examined and dissected free using electrocautery.  I used 4 figure-of-eight 0 Novafil sutures to close the hernia defect.  The deep dermal tissues were then closed with Vicryl suture.  The skin was closed with 4-0 Monocryl and Dermabond.  All sponge needle counts were correct at the end of this case.  At the end of the case we reviewed the infection status of the case. Patient: Private Patient Elective Case Case: Elective Infection Present At Time Of Surgery (PATOS): None  [name], MD General, Bariatric, & Minimally Invasive Surgery [facility], [place]

## 2023-04-07 ENCOUNTER — Encounter (HOSPITAL_COMMUNITY): Payer: Self-pay | Admitting: Surgery

## 2023-04-09 ENCOUNTER — Ambulatory Visit: Payer: Self-pay | Admitting: Psychiatry

## 2023-04-13 ENCOUNTER — Encounter (INDEPENDENT_AMBULATORY_CARE_PROVIDER_SITE_OTHER): Payer: BC Managed Care – PPO | Admitting: Ophthalmology

## 2023-05-10 ENCOUNTER — Encounter (INDEPENDENT_AMBULATORY_CARE_PROVIDER_SITE_OTHER): Payer: BC Managed Care – PPO | Admitting: Ophthalmology

## 2023-05-10 DIAGNOSIS — H4312 Vitreous hemorrhage, left eye: Secondary | ICD-10-CM

## 2023-05-10 DIAGNOSIS — H3562 Retinal hemorrhage, left eye: Secondary | ICD-10-CM

## 2023-05-19 ENCOUNTER — Other Ambulatory Visit: Payer: Self-pay | Admitting: Psychiatry

## 2023-05-21 NOTE — Telephone Encounter (Signed)
Last seen on 03/19/23 per note "-Continue Zonisamide 200 mg daily " Follow up scheduled on 03/17/24

## 2023-05-30 ENCOUNTER — Encounter (INDEPENDENT_AMBULATORY_CARE_PROVIDER_SITE_OTHER): Payer: BC Managed Care – PPO | Admitting: Ophthalmology

## 2023-05-30 DIAGNOSIS — H3562 Retinal hemorrhage, left eye: Secondary | ICD-10-CM

## 2023-05-30 DIAGNOSIS — H4312 Vitreous hemorrhage, left eye: Secondary | ICD-10-CM

## 2023-08-05 IMAGING — CT CT HEAD W/O CM
3 series · 15 of 47 positions shown, 18 images · non-contrast
Comparison: 10/12/2021

CLINICAL DATA: Double vision after Chiari malformation.



[Series 3: head 5.0 h30s · axial · 0.43mm/px · z∈[-117,+28]mm · 9 of 35 slices shown, 12 images]
[im 3/35  brain]
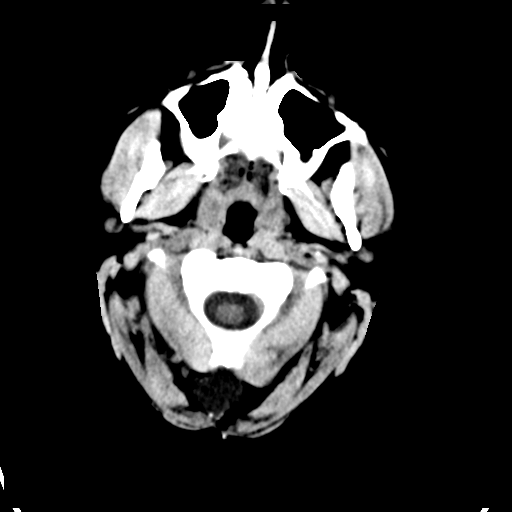
[im 3/35  bone]
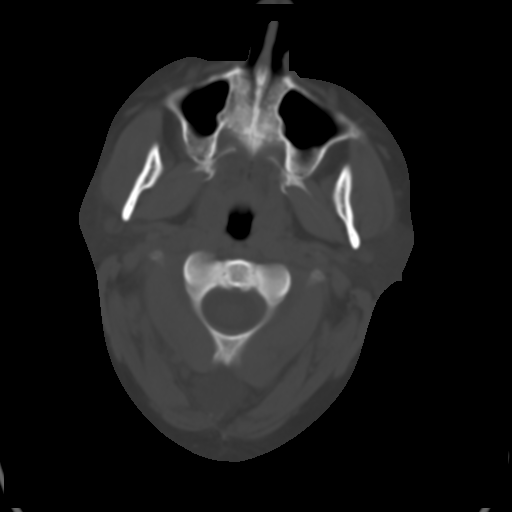
[im 6/35  brain]
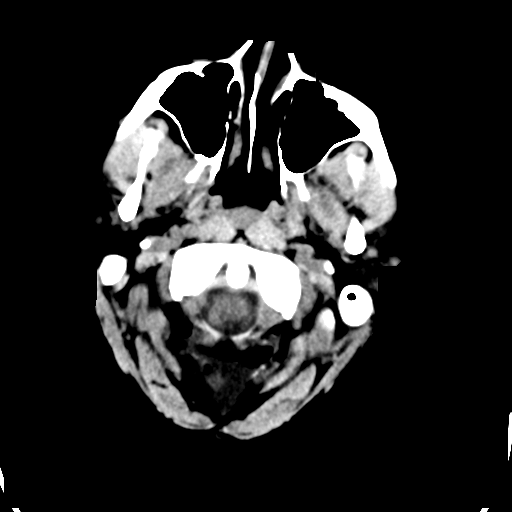
[im 10/35  brain]
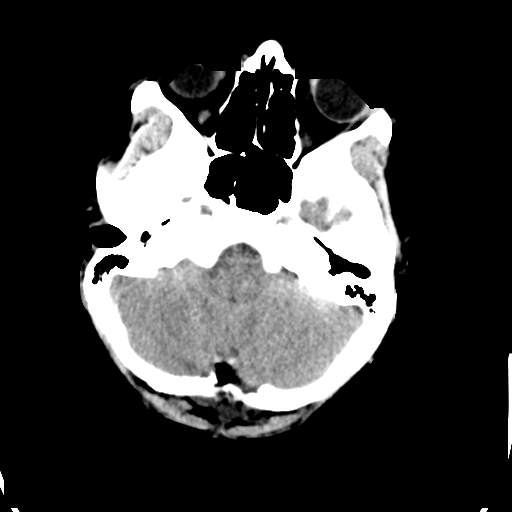
[im 13/35  brain]
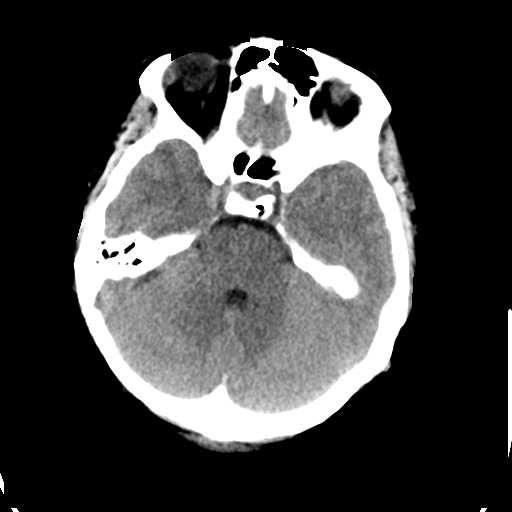
[im 18/35  brain]
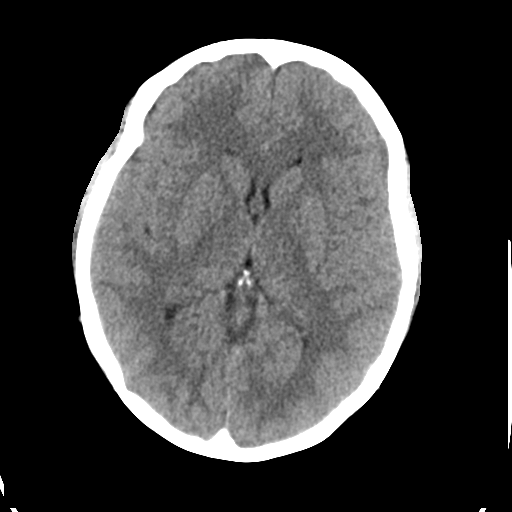
[im 18/35  bone]
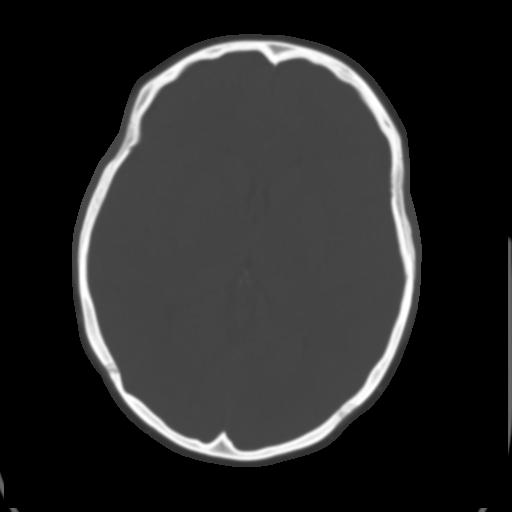
[im 22/35  brain]
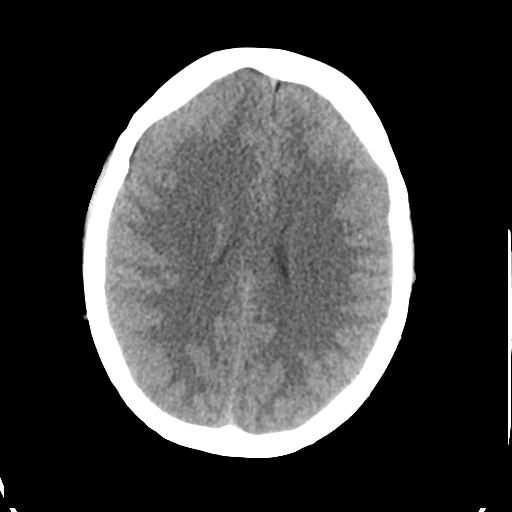
[im 25/35  brain]
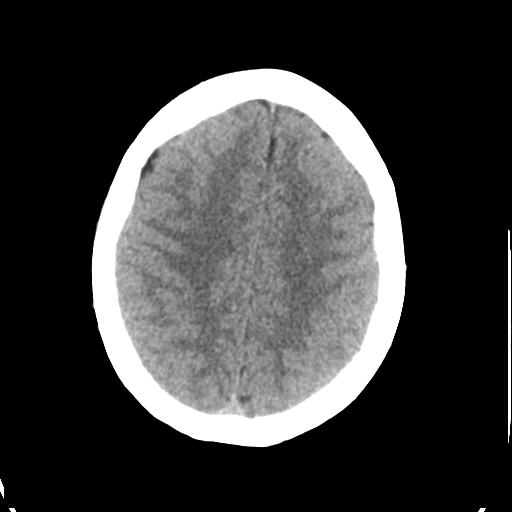
[im 29/35  brain]
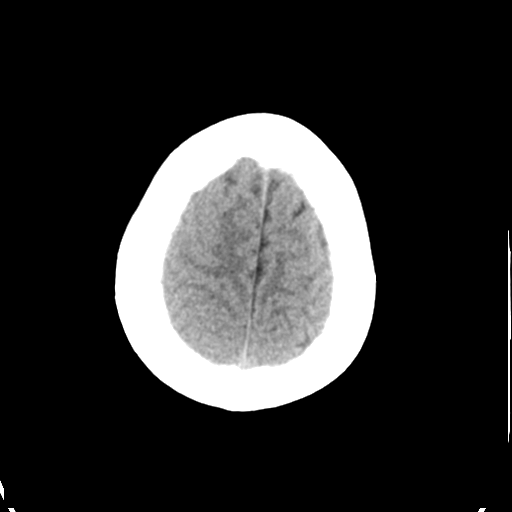
[im 32/35  brain]
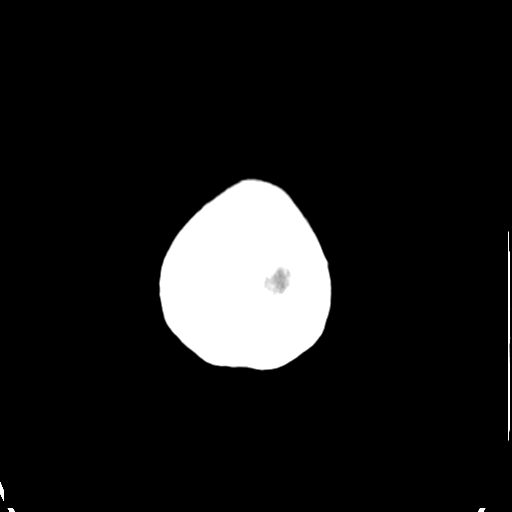
[im 32/35  bone]
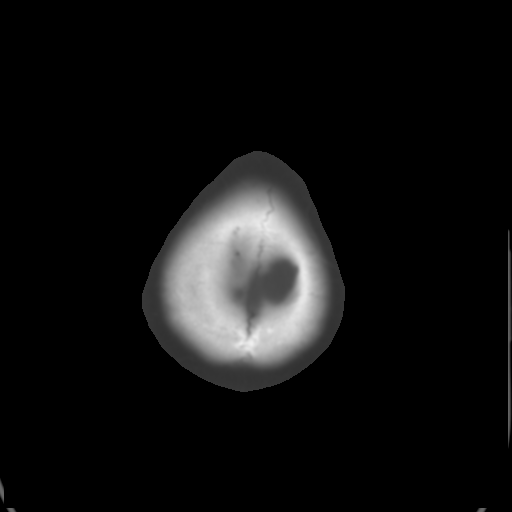

[Series 5: head 3.0 mpr cor · coronal · 0.33mm/px · 3 of 67 slices shown]
[im 23/67  brain]
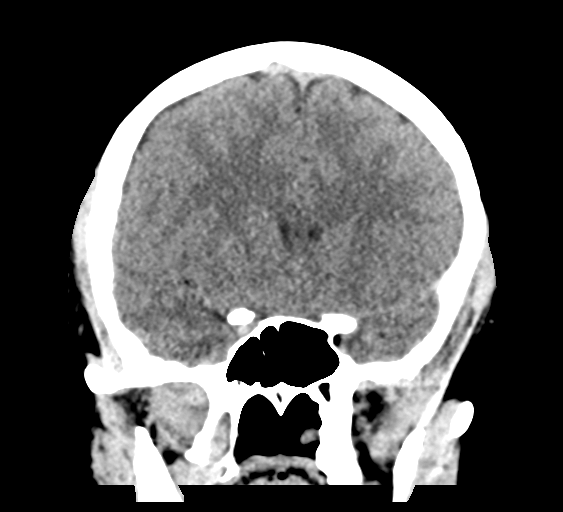
[im 30/67  brain]
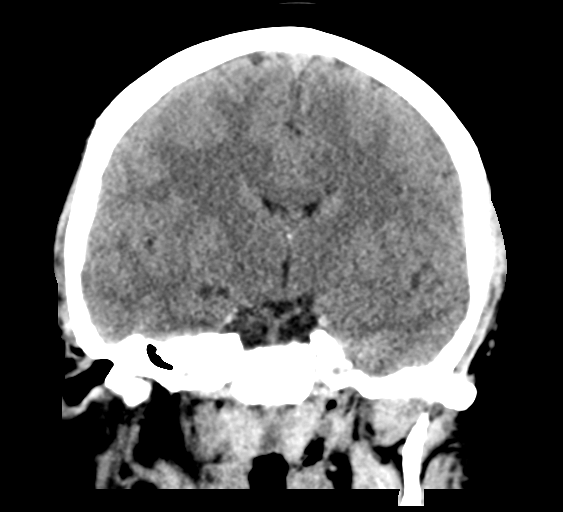
[im 37/67  brain]
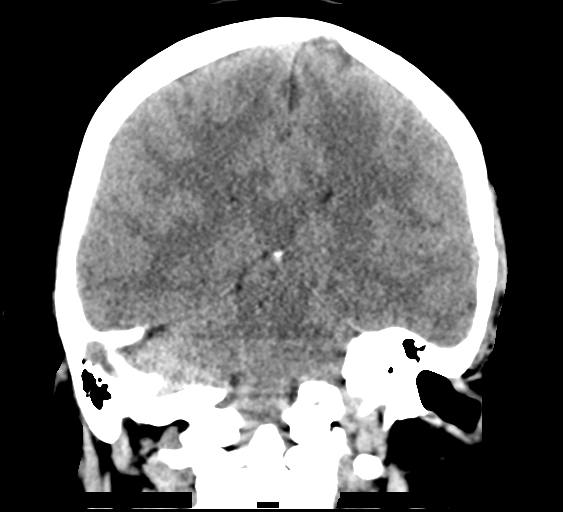

[Series 6: head 3.0 mpr sag · sagittal · 0.33mm/px · 3 of 61 slices shown]
[im 21/61  brain]
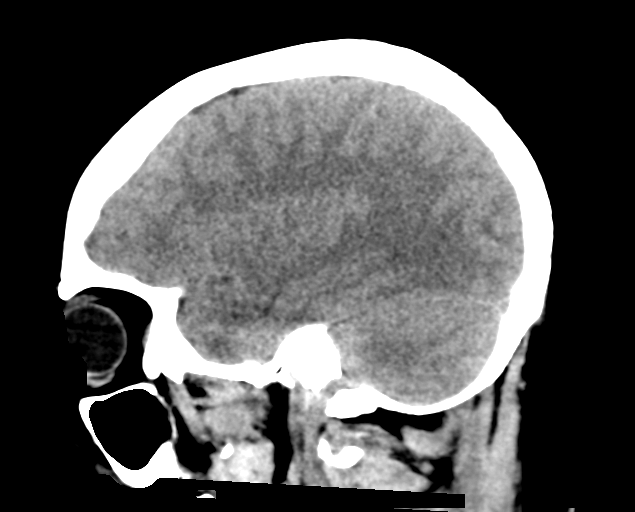
[im 31/61  brain]
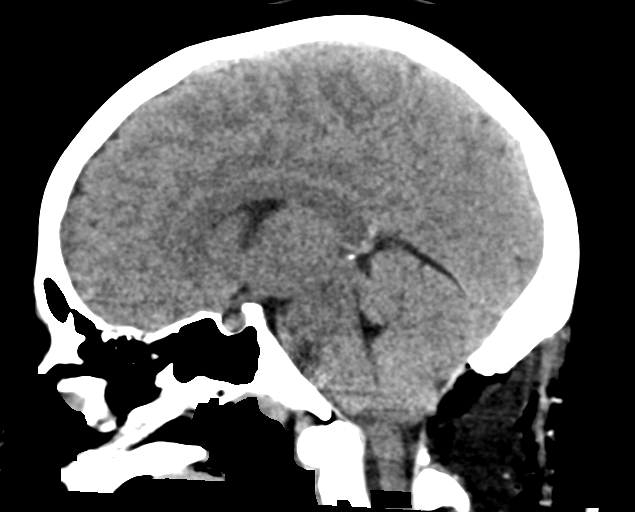
[im 41/61  brain]
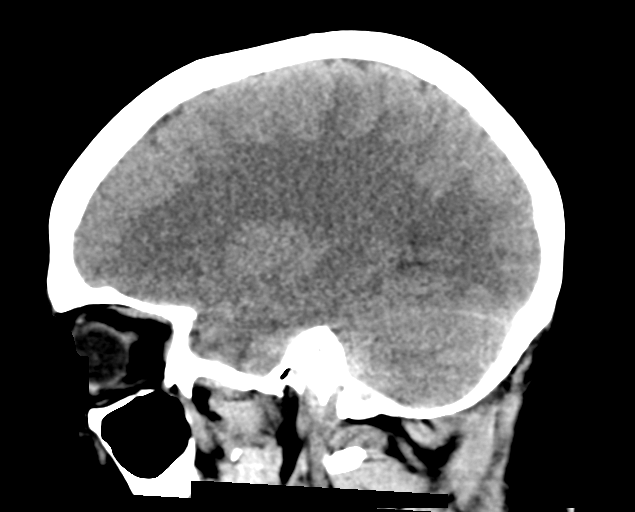

[15 of 47 positions shown; findings below may reference images not displayed]

FINDINGS: Brain: Cerebellar tonsillar ectopia which changes of recent
suboccipital decompression with hemostatic material at the bone
defect. No complicating hemorrhage or visible infarct. Mild brain
sagging suspected when compared to prior MRI.

Vascular: Negative

Skull: Suboccipital decompression.

Sinuses/Orbits: Negative
IMPRESSION: 1. No complicating infarct or hemorrhage after suboccipital
decompression.
2. Suspect mild brain sagging when compared to preoperative MRI

## 2024-03-14 NOTE — Progress Notes (Signed)
 CC:  headaches  Follow-up Visit  Last visit: 03/19/2023 with Dr. Billy Bue  Brief HPI: 28 year old female with a history of Chiari malformation s/p suboccipital decompression 04/03/22 and s/p VP shunt 06/05/22, anxiety, and depression who follows in clinic for headaches. On 06/05/22 she was found to be unresponsive. She was found to have a vitreous hemorrhage and papilledema. LP was done which showed opening pressure of 32 cm. She underwent VP shunt placement 06/13/22 and revision 04/2023  At her last visit she was continued on Zonisamide 100 mg BID.   Interval History:  Returns today for follow-up visit.  Overall has been doing well since prior visit.  Remains on zonisamide without side effects.  Will only have a migraine headache if she misses a dose of zonisamide.  She can have occasional tension type headaches but these are resolved after use of Tylenol.  Previously followed by neurosurgery Dr. Dorothy Gates but is now being followed by Dr. Julane Ny and has f/u visits as needed.     Migraine days per month: 0 Headache free days per month: 30  Current Headache Regimen: Preventative: zonisamide 100 mg BID   Prior Therapies                                  Topamax 50 mg daily - parethesias Zonisamide 100 mg BID Nortriptyline 50 mg QHS   ROS:   14 system review of systems performed and negative with exception of those listed in HPI  Outpatient Encounter Medications as of 03/17/2024  Medication Sig   Calcium Carbonate Antacid (TUMS PO) Take 2 tablets by mouth 2 (two) times daily as needed (acid reflux/heartburn).   cyclobenzaprine (FLEXERIL) 10 MG tablet Take 10 mg by mouth 3 (three) times daily as needed for muscle spasms.   Multiple Vitamin (MULTIVITAMIN WITH MINERALS) TABS tablet Take 1 tablet by mouth daily.   zonisamide (ZONEGRAN) 100 MG capsule Take 1 capsule (100 mg total) by mouth 2 (two) times daily.   [DISCONTINUED] bacitracin-polymyxin b (POLYSPORIN) ophthalmic ointment Place into  the left eye 4 (four) times daily. Place a 1/2 inch ribbon of ointment into the lower eyelid. (Patient not taking: Reported on 03/27/2023)   [DISCONTINUED] methocarbamol (ROBAXIN-750) 750 MG tablet Take 1 tablet (750 mg total) by mouth every 6 (six) hours as needed for muscle spasms.   [DISCONTINUED] ondansetron (ZOFRAN-ODT) 4 MG disintegrating tablet Take 1 tablet (4 mg total) by mouth every 12 (twelve) hours as needed for nausea. (Patient not taking: Reported on 03/27/2023)   [DISCONTINUED] oxyCODONE-acetaminophen (PERCOCET) 5-325 MG tablet Take 1 tablet by mouth every 4 (four) hours as needed for severe pain.   [DISCONTINUED] prednisoLONE acetate (PRED FORTE) 1 % ophthalmic suspension Place 1 drop into the left eye 4 (four) times daily. (Patient not taking: Reported on 03/27/2023)   [DISCONTINUED] traZODone (DESYREL) 100 MG tablet Take 1 tablet (100 mg total) by mouth at bedtime. (Patient not taking: Reported on 03/27/2023)   No facility-administered encounter medications on file as of 03/17/2024.   Past Medical History:  Diagnosis Date   Anxiety    Chiari malformation type I (HCC)    Chronic headaches    Depression    GERD (gastroesophageal reflux disease)    Past Surgical History:  Procedure Laterality Date   APPLICATION OF CRANIAL NAVIGATION  06/13/2022   Procedure: APPLICATION OF CRANIAL NAVIGATION;  Surgeon: Cannon Champion, MD;  Location: [facility] OR;  Service:  Neurosurgery;;   INCISIONAL HERNIA REPAIR Left 04/06/2023   Procedure: OPEN HERNIA REPAIR INCISIONAL REPAIR;  Surgeon: Junie Olds, MD;  Location: [facility] OR;  Service: General;  Laterality: Left;   LAPAROSCOPIC REVISION VENTRICULAR-PERITONEAL (V-P) SHUNT N/A 04/06/2023   Procedure: LAPAROSCOPIC REVISION VENTRICULAR-PERITONEAL (V-P) SHUNT;  Surgeon: Junie Olds, MD;  Location: [facility] OR;  Service: General;  Laterality: N/A;   PARS PLANA VITRECTOMY Left 08/17/2022   Procedure: TWENTY-FIVE GAUGE PARS PLANA VITRECTOMY;  Surgeon:  Ronelle Coffee, MD;  Location: [facility] OR;  Service: Ophthalmology;  Laterality: Left;   SHUNT REMOVAL N/A 06/13/2022   Procedure: Lumboperitoneal Shunt Removal;  Surgeon: Cannon Champion, MD;  Location: [facility] OR;  Service: Neurosurgery;  Laterality: N/A;   SUBOCCIPITAL CRANIECTOMY CERVICAL LAMINECTOMY N/A 04/03/2022   Procedure: Chiari decompression;  Surgeon: Cannon Champion, MD;  Location: [facility] OR;  Service: Neurosurgery;  Laterality: N/A;  RM 21   VENTRICULOPERITONEAL SHUNT N/A 04/29/2022   Procedure: LUMBOPERITONEAL SHUNT PLACEMENT;  Surgeon: Cannon Champion, MD;  Location: [facility] OR;  Service: Neurosurgery;  Laterality: N/A;   VENTRICULOPERITONEAL SHUNT Right 06/13/2022   Procedure: Right Ventriculoperitoneal Shunt Placement;  Surgeon: Cannon Champion, MD;  Location: [facility] OR;  Service: Neurosurgery;  Laterality: Right;       Physical Exam:   Vital Signs: BP (!) 117/55   Pulse 61   Ht 5\' 6"  (1.676 m)   Wt 236 lb (107 kg)   BMI 38.09 kg/m  GENERAL:  well appearing, in no acute distress, alert  SKIN:  Color, texture, turgor normal. No rashes or lesions HEAD:  Normocephalic/atraumatic. RESP: normal respiratory effort MSK:  No gross joint deformities.   NEUROLOGICAL: Mental Status: Alert, oriented to person, place and time, Follows commands, and Speech fluent and appropriate. Cranial Nerves: PERRL, face symmetric, no dysarthria, hearing grossly intact Motor: moves all extremities equally Gait: normal-based.     IMPRESSION: 28 year old female with a history of Chiari malformation s/p suboccipital decompression 04/03/22 and s/p VP shunt 06/05/22 with revision 04/2023, anxiety, and depression who presents for follow up of headaches. She continues to be headache-free on Zonisamide. Will continue current regimen for now.    PLAN: -Continue Zonisamide 100mg  BID - Continue Tylenol as needed for mild tension type headaches - Continue to follow with neurosurgery as  needed    Follow-up in 1 year via MyChart video visit or call earlier if needed     I spent 15 minutes of face-to-face and non-face-to-face time with patient.  This included previsit chart review, lab review, study review, order entry, electronic health record documentation, patient education and discussion regarding above diagnoses and treatment plan and answered all other questions to patient's satisfaction  Johny Nap, [facility]  [facility] [address] [place], [place] [address]  Phone [phone] Fax [phone] Note: This document was prepared with digital dictation and possible smart phrase technology. Any transcriptional errors that result from this process are unintentional.

## 2024-03-17 ENCOUNTER — Ambulatory Visit: Payer: BC Managed Care – PPO | Admitting: Adult Health

## 2024-03-17 ENCOUNTER — Encounter: Payer: Self-pay | Admitting: Adult Health

## 2024-03-17 ENCOUNTER — Ambulatory Visit: Payer: BC Managed Care – PPO | Admitting: Psychiatry

## 2024-03-17 VITALS — BP 117/55 | HR 61 | Ht 66.0 in | Wt 236.0 lb

## 2024-03-17 DIAGNOSIS — G935 Compression of brain: Secondary | ICD-10-CM | POA: Diagnosis not present

## 2024-03-17 DIAGNOSIS — G43009 Migraine without aura, not intractable, without status migrainosus: Secondary | ICD-10-CM

## 2024-03-17 MED ORDER — ZONISAMIDE 100 MG PO CAPS: 100.0000 mg | ORAL_CAPSULE | Freq: Two times a day (BID) | ORAL | 3 refills | Status: AC

## 2024-03-17 NOTE — Patient Instructions (Signed)
 Your Plan:  Continue zonisamide 100mg  twice daily for headache prevention  Please call with any worsening headaches      Follow up in 1 year or call earlier if needed     Thank you for coming to see us  at [facility]. I hope we have been able to provide you high quality care today.  You may receive a patient satisfaction survey over the next few weeks. We would appreciate your feedback and comments so that we may continue to improve ourselves and the health of our patients.

## 2024-11-13 ENCOUNTER — Encounter: Payer: Self-pay | Admitting: Adult Health

## 2025-03-19 ENCOUNTER — Telehealth: Admitting: Adult Health
